# Patient Record
Sex: Female | Born: 1940 | Race: White | Hispanic: No | Marital: Married | Smoking: Never smoker
Health system: Southern US, Community
[De-identification: ages and names within clinical notes are randomized; demographics above are authoritative.]

## PROBLEM LIST (undated history)

## (undated) DIAGNOSIS — N390 Urinary tract infection, site not specified: Secondary | ICD-10-CM

## (undated) DIAGNOSIS — Z8719 Personal history of other diseases of the digestive system: Secondary | ICD-10-CM

## (undated) DIAGNOSIS — Z87898 Personal history of other specified conditions: Secondary | ICD-10-CM

## (undated) DIAGNOSIS — E039 Hypothyroidism, unspecified: Secondary | ICD-10-CM

## (undated) DIAGNOSIS — Z923 Personal history of irradiation: Secondary | ICD-10-CM

## (undated) DIAGNOSIS — M8580 Other specified disorders of bone density and structure, unspecified site: Secondary | ICD-10-CM

## (undated) DIAGNOSIS — Z9221 Personal history of antineoplastic chemotherapy: Secondary | ICD-10-CM

## (undated) DIAGNOSIS — C801 Malignant (primary) neoplasm, unspecified: Secondary | ICD-10-CM

## (undated) DIAGNOSIS — K219 Gastro-esophageal reflux disease without esophagitis: Secondary | ICD-10-CM

## (undated) DIAGNOSIS — M1990 Unspecified osteoarthritis, unspecified site: Secondary | ICD-10-CM

## (undated) DIAGNOSIS — Z91018 Allergy to other foods: Secondary | ICD-10-CM

## (undated) HISTORY — DX: Other specified disorders of bone density and structure, unspecified site: M85.80

## (undated) HISTORY — PX: BACK SURGERY: SHX140

## (undated) HISTORY — PX: ABDOMINAL HYSTERECTOMY: SHX81

## (undated) HISTORY — PX: HERNIA REPAIR: SHX51

## (undated) HISTORY — PX: CHOLECYSTECTOMY: SHX55

## (undated) HISTORY — PX: REDUCTION MAMMAPLASTY: SUR839

## (undated) HISTORY — PX: CATARACT EXTRACTION: SUR2

## (undated) HISTORY — PX: BREAST LUMPECTOMY: SHX2

## (undated) HISTORY — PX: APPENDECTOMY: SHX54

## (undated) HISTORY — PX: TUBAL LIGATION: SHX77

---

## 2012-01-05 HISTORY — PX: COLONOSCOPY: SHX174

## 2014-01-04 HISTORY — PX: CERVICAL DISC SURGERY: SHX588

## 2015-02-18 ENCOUNTER — Other Ambulatory Visit: Payer: Self-pay

## 2015-02-18 ENCOUNTER — Other Ambulatory Visit: Payer: Self-pay | Admitting: Family Medicine

## 2015-02-18 DIAGNOSIS — N6489 Other specified disorders of breast: Secondary | ICD-10-CM

## 2015-02-27 ENCOUNTER — Other Ambulatory Visit: Payer: Self-pay | Admitting: Family Medicine

## 2015-02-27 ENCOUNTER — Ambulatory Visit
Admission: RE | Admit: 2015-02-27 | Discharge: 2015-02-27 | Disposition: A | Discharge status: Home / Self Care | Payer: Medicare Other | Source: Ambulatory Visit | Attending: Family Medicine | Admitting: Family Medicine

## 2015-02-27 DIAGNOSIS — R921 Mammographic calcification found on diagnostic imaging of breast: Secondary | ICD-10-CM

## 2015-02-27 DIAGNOSIS — N6489 Other specified disorders of breast: Secondary | ICD-10-CM

## 2015-03-05 DIAGNOSIS — C801 Malignant (primary) neoplasm, unspecified: Secondary | ICD-10-CM

## 2015-03-05 HISTORY — DX: Malignant (primary) neoplasm, unspecified: C80.1

## 2015-03-11 ENCOUNTER — Ambulatory Visit: Payer: Self-pay | Admitting: Surgery

## 2015-03-11 DIAGNOSIS — C50912 Malignant neoplasm of unspecified site of left female breast: Secondary | ICD-10-CM

## 2015-03-11 NOTE — H&P (Signed)
Isabella Cook 03/11/2015 10:51 AM Location: [facility] Patient #: [id number] DOB: 07/08/1940 Married / Language: Isabella Cook / Race: White Female  History of Present Illness Isabella Cook A. Jewell Haught MD; 03/11/2015 12:37 PM) Patient words: Patient seen at the request of Dr. [name] secondary to abnormal screening mammogram. 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 The area measured 8 mm. No history of breast cancer in her family nor previous breast biopsy. Denies history of breast pain, nipple discharge or breast mass.          CLINICAL DATA: Status post stereotactic guided core needle biopsy of an 8 mm group of microcalcifications in the posterior aspect of the upper-outer quadrant of the left breast.  EXAM: DIAGNOSTIC LEFT MAMMOGRAM POST STEREOTACTIC BIOPSY  COMPARISON: Previous exam(s).  FINDINGS: Mammographic images were obtained following stereotactic guided biopsy of an 8 mm group of microcalcifications in the posterior aspect of the upper-outer quadrant of the left breast. These demonstrate a coil shaped biopsy marker clip 8 mm inferior to residual biopsied calcifications. Some of the targeted calcifications are absent following biopsy.  IMPRESSION: Coil shaped clip 8 mm inferior to the biopsied microcalcifications in the upper-outer quadrant of the left breast.  Final Assessment: Post Procedure Mammograms for Marker Placement   Electronically Signed By: [name] M.D. 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  The patient is a 75 year old female.   Other Problems [name], CMA; 03/11/2015 10:51 AM) Back Pain Bladder Problems Breast Cancer Cholelithiasis Diverticulosis Gastroesophageal Reflux Disease Hemorrhoids Hepatitis Oophorectomy Right. Thyroid Disease Transfusion history  Past Surgical History [name], CMA; 03/11/2015 10:51 AM) Appendectomy Breast Biopsy Left. Colon Polyp Removal - Colonoscopy Gallbladder Surgery - Laparoscopic Hysterectomy (not due to cancer) - Partial Spinal Surgery - Lower Back Spinal Surgery - Neck  Diagnostic Studies History [name], CMA; 03/11/2015 10:51 AM) Colonoscopy 1-5 years ago Mammogram 1-3 years ago  Allergies [name], CMA; 03/11/2015 10:52 AM) Gelatin *Assorted Classes** any medication that is gelatin based  Medication History [name], CMA; 03/11/2015 10:53 AM) Estring (2MG Ring, Vaginal) Active. Medications Reconciled  Social History [name], [place]; 03/11/2015 10:51 AM) Alcohol use Occasional alcohol use. Caffeine use Coffee, Tea. No drug use Tobacco use Former smoker.  Family History [name], [place]; 03/11/2015 10:51 AM) Arthritis Father. Cerebrovascular Accident Father. Colon Polyps Mother. Diabetes Mellitus Mother, Sister. Hypertension Father, Mother, Son. Kidney Disease Father. Respiratory Condition Mother.  Pregnancy / Birth History [name], CMA; 03/11/2015 10:51 AM) Age at menarche 57 years. Contraceptive History Oral contraceptives. Gravida 2 Maternal age 47-25 Para 2     Review of Systems [name] CMA; 03/11/2015 10:52 AM) General Not Present- Appetite Loss, Chills, Fatigue, Fever, Night Sweats, Weight Gain and Weight Loss. Skin Not Present- Change in Wart/Mole, Dryness, Hives, Jaundice, New Lesions, Non-Healing Wounds, Rash and Ulcer. HEENT Present- Hoarseness,  Ringing in the Ears, Seasonal Allergies and Wears glasses/contact lenses. Not Present- Earache, Hearing Loss, Nose Bleed, Oral Ulcers, Sinus Pain, Sore Throat, Visual Disturbances and Yellow Eyes. Respiratory Present- Chronic Cough. Not Present- Bloody  sputum, Difficulty Breathing, Snoring and Wheezing. Cardiovascular Not Present- Chest Pain, Difficulty Breathing Lying Down, Leg Cramps, Palpitations, Rapid Heart Rate, Shortness of Breath and Swelling of Extremities. Gastrointestinal Present- Constipation, Hemorrhoids and Indigestion. Not Present- Abdominal Pain, Bloating, Bloody Stool, Change in Bowel Habits, Chronic diarrhea, Difficulty Swallowing, Excessive gas, Gets full quickly at meals, Nausea, Rectal Pain and Vomiting. Female Genitourinary Present- Frequency. Not Present- Nocturia, Painful Urination, Pelvic Pain and Urgency. Musculoskeletal Present- Back Pain, Joint Pain, Joint Stiffness and Swelling of Extremities. Not Present- Muscle Pain and Muscle Weakness. Neurological Not Present- Decreased Memory, Fainting, Headaches, Numbness, Seizures, Tingling, Tremor, Trouble walking and Weakness. Psychiatric Not Present- Anxiety, Bipolar, Change in Sleep Pattern, Depression, Fearful and Frequent crying. Endocrine Not Present- Cold Intolerance, Excessive Hunger, Hair Changes, Heat Intolerance, Hot flashes and New Diabetes. Hematology Not Present- Easy Bruising, Excessive bleeding, Gland problems, HIV and Persistent Infections.  Vitals [name] CMA; 03/11/2015 10:53 AM) 03/11/2015 10:53 AM Weight: 175 lb Height: 64in Body Surface Area: 1.85 m Body Mass Index: 30.04 kg/m  Temp.: 97.65F(Temporal)  Pulse: 74 (Regular)  BP: 126/68 (Sitting, Left Arm, Standard)      Physical Exam (Dailey Alberson A. Brailee Riede MD; 03/11/2015 12:37 PM)  General Mental Status-Alert. General Appearance-Consistent with stated age. Hydration-Well hydrated. Voice-Normal.  Head and  Neck Head-normocephalic, atraumatic with no lesions or palpable masses. Trachea-midline. Thyroid Gland Characteristics - normal size and consistency.  Chest and Lung Exam Chest and lung exam reveals -quiet, even and easy respiratory effort with no use of accessory muscles and on auscultation, normal breath sounds, no adventitious sounds and normal vocal resonance. Inspection Chest Wall - Normal. Back - normal.  Breast Breast - Left-Symmetric, Non Tender, No Biopsy scars, no Dimpling, No Inflammation, No Lumpectomy scars, No Mastectomy scars, No Peau d' Orange. Breast - Right-Symmetric, Non Tender, No Biopsy scars, no Dimpling, No Inflammation, No Lumpectomy scars, No Mastectomy scars, No Peau d' Orange. Breast Lump-No Palpable Breast Mass.  Cardiovascular Cardiovascular examination reveals -normal heart sounds, regular rate and rhythm with no murmurs and normal pedal pulses bilaterally.  Neurologic Neurologic evaluation reveals -alert and oriented x 3 with no impairment of recent or remote memory. Mental Status-Normal.  Musculoskeletal Normal Exam - Left-Upper Extremity Strength Normal and Lower Extremity Strength Normal. Normal Exam - Right-Upper Extremity Strength Normal and Lower Extremity Strength Normal.  Lymphatic Head & Neck  General Head & Neck Lymphatics: Bilateral - Description - Normal. Axillary  General Axillary Region: Bilateral - Description - Normal. Tenderness - Non Tender.    Assessment & Plan (Annalyssa Thune A. 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 Discussed the pros and cons of each treatment modality. The patient desires breast conservation. We'll schedule left breast C localized partial mastectomy with left axillary sentinel lymph node mapping. Referral to medical and radiation oncology. Risk of  lumpectomy include bleeding, infection, seroma, more surgery, use of seed/wire, wound care, cosmetic deformity and the need for other treatments, death , blood clots, death. Pt agrees to proceed. Risk of sentinel lymph node mapping include bleeding, infection, lymphedema, shoulder pain. stiffness, dye allergy. cosmetic deformity , blood clots, death, need for more surgery. Pt agres to proceed.  Current Plans Pt Education - [facility] Breast Cancer Information Given - [facility] "Breast Journey" Package We discussed the staging and pathophysiology of breast cancer. We discussed all of the different options for treatment for breast cancer including surgery, chemotherapy, radiation therapy, Herceptin,  and antiestrogen therapy. We discussed a sentinel lymph node biopsy as she does not appear to having lymph node involvement right now. We discussed the performance of that with injection of radioactive tracer and blue dye. We discussed that she would have an incision underneath her axillary hairline. We discussed that there is a bout a 10-20% chance of having a positive node with a sentinel lymph node biopsy and we will await the permanent pathology to make any other first further decisions in terms of her treatment. One of these options might be to return to the operating room to perform an axillary lymph node dissection. We discussed about a 1-2% risk lifetime of chronic shoulder pain as well as lymphedema associated with a sentinel lymph node biopsy. We discussed the options for treatment of the breast cancer which included lumpectomy versus a mastectomy. We discussed the performance of the lumpectomy with a wire placement. We discussed a 10-20% chance of a positive margin requiring reexcision in the operating room. We also discussed that she may need radiation therapy or antiestrogen therapy or both if she undergoes lumpectomy. We discussed the mastectomy and the postoperative care for that as well. We discussed that there  is no difference in her survival whether she undergoes lumpectomy with radiation therapy or antiestrogen therapy versus a mastectomy. There is a slight difference in the local recurrence rate being 3-5% with lumpectomy and about 1% with a mastectomy. We discussed the risks of operation including bleeding, infection, possible reoperation. She understands her further therapy will be based on what her stages at the time of her operation.  Pt Education - [facility] breast cancer surgery: discussed with patient and provided information. Pt Education - [facility] Breast Biopsy [facility]CI: discussed with patient and provided information. Referred to Oncology, for evaluation and follow up (Oncology). Routine. Referred to Radiation Oncology, for evaluation and follow up (Radiation Oncology). Routine.

## 2015-03-12 ENCOUNTER — Encounter: Payer: Self-pay | Admitting: Oncology

## 2015-03-12 ENCOUNTER — Telehealth: Payer: Self-pay | Admitting: Oncology

## 2015-03-12 NOTE — Telephone Encounter (Signed)
New breast appt-staff message sent to [facility] and gave appt for 03/29 @ 4 w/Dr. [name].  Referring Dr. [name] Stage  Referral information scan under media tab

## 2015-03-13 ENCOUNTER — Other Ambulatory Visit: Payer: Self-pay | Admitting: Surgery

## 2015-03-13 DIAGNOSIS — N63 Unspecified lump in unspecified breast: Secondary | ICD-10-CM

## 2015-03-14 ENCOUNTER — Other Ambulatory Visit: Payer: Self-pay | Admitting: Surgery

## 2015-03-14 DIAGNOSIS — C50912 Malignant neoplasm of unspecified site of left female breast: Secondary | ICD-10-CM

## 2015-03-25 ENCOUNTER — Ambulatory Visit
Admission: RE | Admit: 2015-03-25 | Discharge: 2015-03-25 | Disposition: A | Discharge status: Home / Self Care | Payer: Medicare Other | Source: Ambulatory Visit | Attending: Surgery | Admitting: Surgery

## 2015-03-25 DIAGNOSIS — N63 Unspecified lump in unspecified breast: Secondary | ICD-10-CM

## 2015-03-25 MED ORDER — GADOBENATE DIMEGLUMINE 529 MG/ML IV SOLN
16.0000 mL | Freq: Once | INTRAVENOUS | Status: AC | PRN
  Administered 2015-03-25: 16 mL via INTRAVENOUS

## 2015-03-26 ENCOUNTER — Other Ambulatory Visit: Payer: Self-pay | Admitting: Surgery

## 2015-03-26 DIAGNOSIS — R928 Other abnormal and inconclusive findings on diagnostic imaging of breast: Secondary | ICD-10-CM

## 2015-03-26 DIAGNOSIS — R2232 Localized swelling, mass and lump, left upper limb: Secondary | ICD-10-CM

## 2015-03-30 NOTE — Progress Notes (Signed)
Location of Breast Cancer:Left Upper-Outer Left Female Breast  Histology per Pathology Report: Diagnosis  02-27-15 Breast, left, needle core biopsy, UOQ INVASIVE DUCTAL CARCINOMA WITH ASSOCIATED CALCIFICATIONS, GRADE 2 DUTAL CARCINOMA IN SITU IS PRESENT Microscopic Comment Receptor Status: ER*100%, PR (100%), Her2-neu (-), Ki-(20%) Did patient present with symptoms (if so, please note symptoms) or was this found on screening mammography?: 02-27-15 Isabella Cook presented with an abnormal mammogram.  Past/Anticipated interventions by surgeon, if any: 4-5-17Left Mastectomy planned Dr. [name]  Past/Anticipated interventions by medical oncology, if any: Appointment with Dr. [name] 04-02-15 at 4 p.m.  Lymphedema issues, if any:No Pain issues, if any:     SAFETY ISSUES:  Prior radiation:No  Pacemaker/ICD? :No  Possible current pregnancy?:No  Is the patient on methotrexate? No  Current Complaints / other details:  Menarche age 59, G91,P2, BC 1 year, HRT 10-15 years,  Menopaus 8 .BP 144/73 mmHg  Pulse 82  Temp(Src) 94.4 F (34.7 C) (Oral)  Resp 18  Ht 5' 3.5" (1.613 m)  Wt 176 lb 12.8 oz (80.196 kg)  BMI 30.82 kg/m2  SpO2 96% .vs  [name], RN 03/30/2015,5:30 PM

## 2015-03-31 ENCOUNTER — Other Ambulatory Visit: Payer: Medicare Other

## 2015-04-01 ENCOUNTER — Other Ambulatory Visit: Payer: Self-pay

## 2015-04-01 DIAGNOSIS — C50919 Malignant neoplasm of unspecified site of unspecified female breast: Secondary | ICD-10-CM

## 2015-04-02 ENCOUNTER — Other Ambulatory Visit (HOSPITAL_BASED_OUTPATIENT_CLINIC_OR_DEPARTMENT_OTHER): Payer: Medicare Other

## 2015-04-02 ENCOUNTER — Ambulatory Visit
Admission: RE | Admit: 2015-04-02 | Discharge: 2015-04-02 | Disposition: A | Discharge status: Home / Self Care | Payer: Medicare Other | Source: Ambulatory Visit | Attending: Radiation Oncology | Admitting: Radiation Oncology

## 2015-04-02 ENCOUNTER — Ambulatory Visit
Admission: RE | Admit: 2015-04-02 | Discharge: 2015-04-02 | Disposition: A | Discharge status: Home / Self Care | Payer: Medicare Other | Source: Ambulatory Visit | Attending: Surgery | Admitting: Surgery

## 2015-04-02 ENCOUNTER — Encounter: Payer: Self-pay | Admitting: Nurse Practitioner

## 2015-04-02 ENCOUNTER — Encounter: Payer: Self-pay | Admitting: Radiation Oncology

## 2015-04-02 ENCOUNTER — Encounter (HOSPITAL_BASED_OUTPATIENT_CLINIC_OR_DEPARTMENT_OTHER): Payer: Self-pay | Admitting: *Deleted

## 2015-04-02 ENCOUNTER — Ambulatory Visit (HOSPITAL_BASED_OUTPATIENT_CLINIC_OR_DEPARTMENT_OTHER): Payer: Medicare Other | Admitting: Oncology

## 2015-04-02 ENCOUNTER — Encounter: Payer: Self-pay | Admitting: *Deleted

## 2015-04-02 VITALS — BP 144/73 | HR 82 | Temp 94.4°F | Resp 18 | Ht 63.5 in | Wt 176.8 lb

## 2015-04-02 VITALS — BP 161/69 | HR 75 | Temp 97.9°F | Resp 19 | Wt 175.8 lb

## 2015-04-02 DIAGNOSIS — C50412 Malignant neoplasm of upper-outer quadrant of left female breast: Secondary | ICD-10-CM | POA: Diagnosis present

## 2015-04-02 DIAGNOSIS — Z170 Estrogen receptor positive status [ER+]: Secondary | ICD-10-CM | POA: Insufficient documentation

## 2015-04-02 DIAGNOSIS — R928 Other abnormal and inconclusive findings on diagnostic imaging of breast: Secondary | ICD-10-CM

## 2015-04-02 DIAGNOSIS — M8580 Other specified disorders of bone density and structure, unspecified site: Secondary | ICD-10-CM

## 2015-04-02 DIAGNOSIS — C50912 Malignant neoplasm of unspecified site of left female breast: Secondary | ICD-10-CM | POA: Insufficient documentation

## 2015-04-02 DIAGNOSIS — C50919 Malignant neoplasm of unspecified site of unspecified female breast: Secondary | ICD-10-CM

## 2015-04-02 LAB — CBC WITH DIFFERENTIAL/PLATELET
BASO%: 0.1 % (ref 0.0–2.0)
BASOS ABS: 0 10*3/uL (ref 0.0–0.1)
EOS ABS: 0.1 10*3/uL (ref 0.0–0.5)
EOS%: 1.4 % (ref 0.0–7.0)
HCT: 41.6 % (ref 34.8–46.6)
HEMOGLOBIN: 13.6 g/dL (ref 11.6–15.9)
LYMPH%: 22.1 % (ref 14.0–49.7)
MCH: 28.9 pg (ref 25.1–34.0)
MCHC: 32.7 g/dL (ref 31.5–36.0)
MCV: 88.5 fL (ref 79.5–101.0)
MONO#: 0.8 10*3/uL (ref 0.1–0.9)
MONO%: 12.9 % (ref 0.0–14.0)
NEUT%: 63.5 % (ref 38.4–76.8)
NEUTROS ABS: 3.8 10*3/uL (ref 1.5–6.5)
PLATELETS: 207 10*3/uL (ref 145–400)
RBC: 4.7 10*6/uL (ref 3.70–5.45)
RDW: 14 % (ref 11.2–14.5)
WBC: 5.9 10*3/uL (ref 3.9–10.3)
lymph#: 1.3 10*3/uL (ref 0.9–3.3)

## 2015-04-02 LAB — COMPREHENSIVE METABOLIC PANEL
ALBUMIN: 4 g/dL (ref 3.5–5.0)
ALK PHOS: 55 U/L (ref 40–150)
ALT: 15 U/L (ref 0–55)
ANION GAP: 8 meq/L (ref 3–11)
AST: 15 U/L (ref 5–34)
BUN: 11.1 mg/dL (ref 7.0–26.0)
CO2: 30 mEq/L — ABNORMAL HIGH (ref 22–29)
Calcium: 10.1 mg/dL (ref 8.4–10.4)
Chloride: 106 mEq/L (ref 98–109)
Creatinine: 0.9 mg/dL (ref 0.6–1.1)
EGFR: 61 mL/min/{1.73_m2} — AB (ref >90)
Glucose: 94 mg/dl (ref 70–140)
POTASSIUM: 4.2 meq/L (ref 3.5–5.1)
Sodium: 143 mEq/L (ref 136–145)
TOTAL PROTEIN: 7.8 g/dL (ref 6.4–8.3)

## 2015-04-02 MED ORDER — GADOBENATE DIMEGLUMINE 529 MG/ML IV SOLN
16.0000 mL | Freq: Once | INTRAVENOUS | Status: AC | PRN
  Administered 2015-04-02: 16 mL via INTRAVENOUS

## 2015-04-02 NOTE — Progress Notes (Signed)
Radiation Oncology         [phone]) [phone] ________________________________  Initial outpatient Consultation - Date: 04/02/2015   Name: Isabella Cook MRN: [id number]   DOB: July 11, 1940  REFERRING PHYSICIAN: [name], MD  DIAGNOSIS AND STAGE: T1 N0 Left breast cancer No matching staging information was found for the patient.  HISTORY OF PRESENT ILLNESS::Isabella Cook is a 75 y.o. female  who had a screening mammogram near [facility] which showed an area of calcification in the UOQ of the left breast. These measured about 8 mm. Stereotactic biopsy on 2/23 showed Grade 2 invasive ductal carcinoma which was ER+ and PR+ at 100%. HER-2 was also amplified. She had a breast MRI on 3/21 which showed an area of non-mass enhancement measuring 7.8 cm with two irregular masses measuring 1.7 and 1.4 cm. These masses were 5 cm anterior and superior to the biopsy clip. A left axillary lymph node had cortical thickening. A 7 mm mass was noted in the right breast, as well.   Biopsy of the NME of the left breast, as well as the right breast mass was performed this morning. She is scheduled to see Dr. [name] today as well. He has discussed anti HER-2 therapy and is recommending port placement. Her definitive surgery is scheduled for April 5th but may change based on the results of these biopsies.  Isabella Cook is accompanied by her husband today. She did well with her biopsy this morning and is without complaint. The patient is leaning towards breast conservation surgery at this time. The patient and her husband live in [place].   PREVIOUS RADIATION THERAPY: No  Past medical, social and family history were reviewed in the electronic chart. Review of symptoms was reviewed in the electronic chart. Medications were reviewed in the electronic chart.   Menarche age 67, G51,P2, BC 1 year, HRT 10-15 years,  Menopause 60  PHYSICAL EXAM:  Filed Vitals:   04/02/15 1440  BP: 144/73  Pulse: 82  Temp: 94.4 F (34.7 C)   Resp: 18  .176 lb 12.8 oz (80.196 kg).   Neck: No palpable cervical or supraclavicular adenopathy present. Well healed surgical incision above the left clavicle. Breast: Left and Right breast both have a blood soaked bandage on them with no palpable abnormalities.    IMPRESSION: T1 N0 Left breast cancer  PLAN: I spoke to the patient today regarding her diagnosis and options for treatment. We discussed the equivalence in terms of survival and local failure between mastectomy and breast conservation. We discussed the role of radiation in decreasing local failures in patients who undergo mastectomy and have positive lymph nodes or tumors greater than 5 cm. We discussed the role of radiation and decreasing local failures in patients who undergo lumpectomy.We discussed the possible side effects including but not limited to asymptomatic rib, heart and lung damage, heart disease, skin redness, fatigue, permanent skin darkening, and chest wall swelling. We discussed increased complications that can occur with reconstruction after radiation. We discussed the process of simulation and the placement tattoos. We discussed the low likelihood of secondary malignancies.   Biopsy from 3/29 pending. Based on these results, Dr. [name] and Dr. [name] Stage will determine future treatment options and refer back to our clinic as appropriate. He was able to meet with survivorship and is headed up to meet with medical oncology.   I spent 40 minutes  face to face with the patient and more than 50% of that time was spent in counseling and/or coordination of  care.   ------------------------------------------------  Thea Silversmith, MD  This document serves as a record of services personally performed by Thea Silversmith, MD. It was created on her behalf by Arlyce Harman, a trained medical scribe. The creation of this record is based on the scribe's personal observations and the provider's statements to them. This document  has been checked and approved by the attending provider.

## 2015-04-02 NOTE — Addendum Note (Signed)
Encounter addended by: Malena Edman, RN on: 04/02/2015  5:08 PM<BR>     Documentation filed: Charges VN

## 2015-04-02 NOTE — Progress Notes (Signed)
[place]  [phone] [phone]     ID: Isabella Cook DOB: Mar 18, 1940  MR#: [id number]  LXB#:[id number]  Patient Care Team: [name], FNP as PCP - General (Family Medicine) [name], MD as Consulting Physician (Oncology) [name], MD as Consulting Physician (General Surgery) [name], NP as Nurse Practitioner (Hematology and Oncology) [name], MD as Consulting Physician (Oncology) Thea Silversmith, MD as Consulting Physician (Radiation Oncology) [name], MD as Consulting Physician (Neurosurgery) [name], MD as Referring Physician (Otolaryngology) PCP: [name], FNP OTHER MD:  CHIEF COMPLAINT: HER-2 positive breast cancer  CURRENT TREATMENT: Workup in progress   BREAST CANCER HISTORY:    Isabella Cook had routine screening mammography at [facility] suggesting an area of indeterminate microcalcifications in the upper outer quadrant of the left breast measuring 8 mm. She was referred for left breast stereotactic biopsy performed at the [facility] 02/27/2015. This showed ([id number]) invasive ductal carcinoma, grade 2, estrogen receptor 100% positive, digestion receptor 100% positive, both with strong staining intensity, with an MIB-1 of 20%, and HER-2 amplified with a signals ratio of 2.53, the number per cell being 2.65. 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  On 03/25/2015 the patient underwent bilateral breast MRI, which showed the breast density to be category B. this showed, in the left breast, an area of non-masslike enhancement measuring 7.8 cm. Associated with this were 2 adjacent irregular masses measuring 1.7 and 1.4 cm. The masses were 5 cm anterior and superior to the biopsy clip. There was a cortically thickened left axillary lymph node noted.  Also in the right breast there was a 7 mm irregular enhancing mass at the 6:30 o'clock position.  Her subsequent  history is as detailed below    INTERVAL HISTORY: Isabella Cook was evaluated in the breast clinic 04/02/2015 accompanied by her husband Isabella Cook. She also met with Dr. Pablo Ledger in radiation oncology the same day  REVIEW OF SYSTEMS: There were no specific symptoms leading to the original mammogram, which was routinely scheduled. The patient denies unusual headaches, visual changes, nausea, vomiting, stiff neck, dizziness, or gait imbalance. There has been no significant cough, phlegm production, or pleurisy, no chest pain or pressure, and no change in bowel or bladder habits. The patient denies fever, rash, bleeding, unexplained fatigue or unexplained weight loss. She does have multiple food allergies and is receiving desensitization shots through Dr. Lovena Le in [place]. She has seasonal allergies with some ringing in her ears, runny nose, and sinus drainage. She just had a tooth extracted and is on antibiotics for what may be a small. Dental abscess. A detailed review of systems was otherwise entirely negative.  PAST MEDICAL HISTORY: Past Medical History  Diagnosis Date  . GERD (gastroesophageal reflux disease)   . Arthritis     wrists and knees  . Cancer [facility]) 03-2015    left breast  . Multiple food allergies     PAST SURGICAL HISTORY: Past Surgical History  Procedure Laterality Date  . Abdominal hysterectomy    . Cholecystectomy    . Appendectomy    . Tubal ligation    . Back surgery      lumbar  . Hernia repair      UHR  . Cervical disc surgery  2016    FAMILY HISTORY No family history on file. The patient's father had a history of pseudo-bulbar pulse 1 and died from a stroke at age 54. The patient's mother died at age  79 from complications of emphysema. The patient had no brothers, 2 sisters. There is no history of cancer in the family and specifically no history of breast or ovarian cancer.  GYNECOLOGIC HISTORY:  No LMP recorded. Patient has had a hysterectomy. Menarche age 65, first  live birth age 17. She is GX P2. She underwent a hysterectomy with left salpingo-oophorectomy in 1984. She received hormone replacement for approximately 15 years, until 2005.  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 Her husband Isabella Cook is a retired Social research officer, government. Daughter Isabella Cook lives in [place] where she is a housewife, homeschooling her children. Son Isabella Cook lives in [place] and works in Chartered certified accountant. The patient has 2 grandchildren. She is not a church attender    ADVANCED DIRECTIVES: In place   HEALTH MAINTENANCE: Social History  Substance Use Topics  . Smoking status: Never Smoker   . Smokeless tobacco: Not on file  . Alcohol Use: Yes     Comment: occ     Colonoscopy:2014  PAP:  Bone density: 2017 [facility]//osteopenia   Lipid panel:  Allergies  Allergen Reactions  . Gelatin (Bovine) [Beef Extract] Anaphylaxis  . Milk-Related Compounds     Milk products    Current Outpatient Prescriptions  Medication Sig Dispense Refill  . calcium carbonate (TUMS - DOSED IN MG ELEMENTAL CALCIUM) 500 MG chewable tablet Chew 1 tablet by mouth daily.    Marland Kitchen estradiol (ESTRING) 2 MG vaginal ring Place 2 mg vaginally every 3 (three) months. follow package directions    . famotidine (PEPCID) 20 MG tablet Take 20 mg by mouth 2 (two) times daily.    Marland Kitchen loratadine (CLARITIN) 10 MG tablet Take 10 mg by mouth daily.    . Menaquinone-7 (VITAMIN K2 PO) Take 45 mcg by mouth daily.    . penicillin v potassium (VEETID) 500 MG tablet Take 500 mg by mouth 4 (four) times daily.    . Probiotic Product (ALIGN PO) Take by mouth.    Marland Kitchen VITAMIN D, CHOLECALCIFEROL, PO Take 5,000 Units by mouth daily.    Marland Kitchen VITAMIN D-VITAMIN K PO Take by mouth.    . methylcellulose oral powder Take by mouth daily. Reported on 04/02/2015     No current facility-administered medications for this visit.    OBJECTIVE:  Middle-aged white woman who appears stated age  29 Vitals:   04/02/15 1605  BP: 161/69  Pulse: 75  Temp: 97.9 F (36.6 C)  Resp: 19     Body mass index is 30.65 kg/(m^2).    ECOG FS:1 - Symptomatic but completely ambulatory  Ocular: Sclerae unicteric, pupils equal, round and reactive to light Ear-nose-throat: Oropharynx clear and moist Lymphatic: No cervical or supraclavicular adenopathy Lungs no rales or rhonchi, good excursion bilaterally Heart regular rate and rhythm, no murmur appreciated Abd soft, nontender, positive bowel sounds MSK no focal spinal tenderness, no joint edema Neuro: non-focal, well-oriented, appropriate affect Breasts: Both breasts are status post recent biopsy. Steri-Strips are still in place. There is a moderate ecchymosis on the left. There is a palpable mass in the inferior outer aspect of the left breast which may be related to the recent biopsy. Both axillae are benign.   LAB RESULTS:  CMP     Component Value Date/Time   NA 143 04/02/2015 1554   K 4.2 04/02/2015 1554   CO2 30* 04/02/2015 1554   GLUCOSE 94 04/02/2015 1554   BUN  11.1 04/02/2015 1554   CREATININE 0.9 04/02/2015 1554   CALCIUM 10.1 04/02/2015 1554   PROT 7.8 04/02/2015 1554   ALBUMIN 4.0 04/02/2015 1554   AST 15 04/02/2015 1554   ALT 15 04/02/2015 1554   ALKPHOS 55 04/02/2015 1554   BILITOT <0.30 04/02/2015 1554    INo results found for: SPEP, UPEP  Lab Results  Component Value Date   WBC 5.9 04/02/2015   NEUTROABS 3.8 04/02/2015   HGB 13.6 04/02/2015   HCT 41.6 04/02/2015   MCV 88.5 04/02/2015   PLT 207 04/02/2015      Chemistry      Component Value Date/Time   NA 143 04/02/2015 1554   K 4.2 04/02/2015 1554   CO2 30* 04/02/2015 1554   BUN 11.1 04/02/2015 1554   CREATININE 0.9 04/02/2015 1554      Component Value Date/Time   CALCIUM 10.1 04/02/2015 1554   ALKPHOS 55 04/02/2015 1554   AST 15 04/02/2015 1554   ALT 15 04/02/2015 1554   BILITOT <0.30 04/02/2015  1554       No results found for: LABCA2  No components found for: LABCA125  No results for input(s): INR in the last 168 hours.  Urinalysis No results found for: COLORURINE, APPEARANCEUR, LABSPEC, PHURINE, GLUCOSEU, HGBUR, BILIRUBINUR, KETONESUR, PROTEINUR, UROBILINOGEN, NITRITE, LEUKOCYTESUR    ELIGIBLE FOR AVAILABLE RESEARCH PROTOCOL: no  STUDIES: Mr Breast Bilateral W Wo Contrast  03/25/2015  CLINICAL DATA:  Patient with recent diagnosis of left breast grade 2 invasive ductal carcinoma with associated ductal carcinoma in situ. EXAM: BILATERAL BREAST MRI WITH AND WITHOUT CONTRAST TECHNIQUE: Multiplanar, multisequence MR images of both breasts were obtained prior to and following the intravenous administration of 16 ml of MultiHance. THREE-DIMENSIONAL MR IMAGE RENDERING ON INDEPENDENT WORKSTATION: Three-dimensional MR images were rendered by post-processing of the original MR data on an independent workstation. The three-dimensional MR images were interpreted, and findings are reported in the following complete MRI report for this study. Three dimensional images were evaluated at the independent DynaCad workstation COMPARISON:  Previous exam(s). FINDINGS: Breast composition: b.  Scattered fibroglandular tissue. Background parenchymal enhancement: Mild Right breast: Within the 630 o'clock right breast middle depth there is a 7 mm irregular enhancing mass (image 118; series 7). No additional concerning areas of enhancement are identified within the right breast. Left breast: Susceptibility artifact is demonstrated within the lower outer left breast middle to posterior depth compatible with biopsy marking clip. Extending anterior and superior from this biopsy marking clip is a 7.8 x 3.6 x 3.3 cm area of clumped non mass enhancement. Along the anterior superior margin of this clumped non mass enhancement there are 2 adjacent irregular enhancing masses measuring 1.7 x 1.0 x 1.1 cm and 1.4 x 1.1 x 1.2  cm. These masses are located within the upper-outer left breast and are approximately 8 mm apart. These masses are located approximately 5 cm anterior and superior to the biopsy marking clip on sagittal imaging. Lymph nodes: There is a cortically thickened left axillary lymph node (image 64; series 4). Ancillary findings:  None. IMPRESSION: Large area of clumped non mass enhancement throughout the lower outer and upper outer left breast concerning for extension of disease throughout the lateral left breast given history of recently biopsied left breast invasive ductal carcinoma. Two adjacent irregular enhancing masses within the upper-outer left breast along the cranial margin of the non mass enhancement, concerning for associated disease, likely invasive given the imaging characteristics. Irregular suspicious enhancing mass within the  630 o'clock right breast. Cortically thickened left axillary lymph node. RECOMMENDATION: MRI guided core needle biopsy irregular enhancing mass within the lower outer right breast. If left breast conservation is desired, at the time of the above recommended right breast MRI guided core needle biopsy, MRI guided core needle biopsy of the furthest extent of disease within the upper-outer left breast can be obtained. Second-look ultrasound and possible biopsy of cortically thickened left axillary lymph node. BI-RADS CATEGORY  4: Suspicious. Electronically Signed   By: Lovey Newcomer M.D.   On: 03/25/2015 16:19   Mm Digital Diagnostic Bilat  04/02/2015  CLINICAL DATA:  Status post MRI guided core biopsies bilaterally. EXAM: DIAGNOSTIC BILATERAL MAMMOGRAM POST MRI BIOPSY COMPARISON:  Previous exam(s). FINDINGS: Mammographic images were obtained following MRI guided biopsy of area of enhancement within the 630 o'clock location of the right breast. A cylinder-shaped clip is identified in the lower outer quadrant of the right breast, in expected location. Following biopsy of an enhancing mass  the upper-outer quadrant, anterior left breast, a bow tie shaped clip is identified in the anterior upper outer quadrant as expected. This clip is approximately 6.3 cm anterior to the clip placed the time of recent diagnosis malignancy. IMPRESSION: Tissue marker clips are in expected locations bilaterally. Final Assessment: Post Procedure Mammograms for Marker Placement Electronically Signed   By: Nolon Nations M.D.   On: 04/02/2015 10:01   Mr Aundra Millet Breast Bx Johnella Moloney Dev 1st Lesion Image Bx Spec Mr Guide  04/02/2015  CLINICAL DATA:  Patient presents for MR guided core biopsies of lesions bilaterally. Recently diagnosed grade 2 invasive ductal carcinoma with associated calcifications diagnosed by stereotactic guided core biopsy of calcifications in the posterior upper outer quadrant of the left breast. EXAM: MR GUIDED CORE NEEDLE BIOPSY OF THE LEFT BREAST MRI GUIDED CORE NEEDLE BIOPSY OF THE RIGHT BREAST TECHNIQUE: Multiplanar, multisequence MR imaging of bilateral breasts was performed both before and after administration of intravenous contrast. CONTRAST:  49m MULTIHANCE GADOBENATE DIMEGLUMINE 529 MG/ML IV SOLN COMPARISON:  Previous exams. FINDINGS: I met with the patient, and we discussed the procedure of MRI guided biopsy, including risks, benefits, and alternatives. Specifically, we discussed the risks of infection, bleeding, tissue injury, clip migration, and inadequate sampling. Informed, written consent was given. The usual time out protocol was performed immediately prior to the procedure. SITE 1, LEFT BREAST, BOW TIE SHAPED CLIP: Using sterile technique, 1% Lidocaine, MRI guidance, and a 9 gauge vacuum assisted device, biopsy was performed of enhancing mass within the anterior upper outer quadrant of the left breast using a lateral approach. At the conclusion of the procedure, a bow tie shaped tissue marker clip was deployed into the biopsy cavity. SITE 2, RIGHT BREAST, CYLINDER-SHAPED CLIP: Using the  same technique, 1% lidocaine, MR guidance, and a 9 gauge vacuum assisted device, biopsy was performed of a small enhancing mass within the lower outer quadrant of the right breast using a lateral approach. At the conclusion of the procedure a cylinder-shaped clip was deployed at the biopsy site. Follow-up 2-view mammogram was performed and dictated separately. IMPRESSION: MRI guided biopsy of single lesion in each breast. No apparent complications. Electronically Signed   By: ENolon NationsM.D.   On: 04/02/2015 10:15   Mr Rt Breast Bx WJohnella MoloneyDev 1st Lesion Image Bx Spec Mr Guide  04/02/2015  CLINICAL DATA:  Patient presents for MR guided core biopsies of lesions bilaterally. Recently diagnosed grade 2 invasive ductal carcinoma with associated calcifications diagnosed  by stereotactic guided core biopsy of calcifications in the posterior upper outer quadrant of the left breast. EXAM: MR GUIDED CORE NEEDLE BIOPSY OF THE LEFT BREAST MRI GUIDED CORE NEEDLE BIOPSY OF THE RIGHT BREAST TECHNIQUE: Multiplanar, multisequence MR imaging of bilateral breasts was performed both before and after administration of intravenous contrast. CONTRAST:  6m MULTIHANCE GADOBENATE DIMEGLUMINE 529 MG/ML IV SOLN COMPARISON:  Previous exams. FINDINGS: I met with the patient, and we discussed the procedure of MRI guided biopsy, including risks, benefits, and alternatives. Specifically, we discussed the risks of infection, bleeding, tissue injury, clip migration, and inadequate sampling. Informed, written consent was given. The usual time out protocol was performed immediately prior to the procedure. SITE 1, LEFT BREAST, BOW TIE SHAPED CLIP: Using sterile technique, 1% Lidocaine, MRI guidance, and a 9 gauge vacuum assisted device, biopsy was performed of enhancing mass within the anterior upper outer quadrant of the left breast using a lateral approach. At the conclusion of the procedure, a bow tie shaped tissue marker clip was deployed  into the biopsy cavity. SITE 2, RIGHT BREAST, CYLINDER-SHAPED CLIP: Using the same technique, 1% lidocaine, MR guidance, and a 9 gauge vacuum assisted device, biopsy was performed of a small enhancing mass within the lower outer quadrant of the right breast using a lateral approach. At the conclusion of the procedure a cylinder-shaped clip was deployed at the biopsy site. Follow-up 2-view mammogram was performed and dictated separately. IMPRESSION: MRI guided biopsy of single lesion in each breast. No apparent complications. Electronically Signed   By: ENolon NationsM.D.   On: 04/02/2015 10:15    ASSESSMENT: 75y[facility]  status post left breast biopsy 02/27/2015 for a clinical mT1-3 N1 invasive ductal carcinoma, grade 2, strongly estrogen and progesterone receptor positive, HER-2 amplified, with an MIB-1 of 20%.   (1) bilateral breast MRI 03/25/2015 shows, in addition to a large area of non-masslike enhancement in the left breast, a 0.7 cm mass in the lower outer quadrant of the contralateral, right breast; biopsy of both these areas was performed 04/02/2015-- results pending  (2)  Surgery tentatively  scheduled for 04/09/2015  (3) adjuvant systemic treatment will consist of anti-HER-2 therapy with chemotherapy  (4) adjuvant radiation therapy to follow as appropriate  (5) adjuvant anti-follow estrogens to follow at the completion of local treatment.  PLAN: We spent the better part of today's hour-long appointment discussing the biology of breast cancer in general, and the specifics of the patient's tumor in particular. JJonieceunderstands I am not able to give her a firm stage at present. He has a very large area of abnormality in the left breast, but how much of this is DCIS and how much is invasive disease is very difficult to tell.  I do not see how she can avoid a left mastectomy even if we do neoadjuvant chemotherapy. If we do have a complete radiologic response possibly a limited  resection could be attempted.  On the other hand the extent of disease does affect the choice of chemotherapy. If we have a tumor less than 2 cm I would probably use only docetaxel (in conjunction with trastuzumab) whereas if it is greater than 2.0 cm I would use carboplatin and docetaxel in conjunction with trastuzumab and pertuzumab.  We did discuss the fact that the choice of lumpectomy versus mastectomy does not affect overall survival and she understands that whether we give the chemotherapy before or after surgery also does not affect survival.  It will  help to have the results of the biopsies obtained today and she also has a lymph node biopsy scheduled for 04/04/2015. Most likely the best plan will be for her to discuss all this data once it becomes available with Dr. [name] Stage and then she and he can make a definitive decision.  In any case from as systemic therapy point of view she will need a port. I will also schedule her to come to "chemotherapy school" prior to the start of any treatment. She will need an echocardiogram before anti-HER-2 therapy is started.  I emphasized to Diyana the triple positive patients generally have an excellent chance of cure. If her risk of recurrence after local treatment only is X, by doing chemotherapy, anti-estrogens, and anti-HER-2 treatment her final risk of recurrence will be 1/6 of X. (Of course I cannot give her a number for ask until I have more data regarding her primary tumor).  She has an Estring in place and this will need to be removed. Eventually it could be reinserted if she starts tamoxifen.  Tentatively I am scheduling Isabella Cook to return to see me on April 14 but we can move that day forward to her back depending on which decision she finally makes with Dr. [name] Stage.  Skyleen has a good understanding of the overall plan. She agrees with it. She knows the goal of treatment in her case is cure. She will call with any problems that may develop before her  next visit here.  [name], MD   04/02/2015 6:51 PM Medical Oncology and Hematology [facility] [address] [place], [place] [address] Tel. [phone]    Fax. [phone]

## 2015-04-02 NOTE — Progress Notes (Signed)
Ms. Isabella Cook is a very pleasant 75 y.o. 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 Biopsy results revealed the tumor's prognostic profile is ER positive, PR positive, and HER2/neu positive.Marland Kitchen Ki67 is 20%.  She presents today with her husband for treatment consideration and recommendations from the radiation oncologist and medical oncologist.     I briefly met with Ms. Isabella Cook and her husband during her visit today. We discussed the purpose of the Survivorship Clinic, which will include monitoring for recurrence, coordinating completion of age and gender-appropriate cancer screenings, promotion of overall wellness, as well as managing potential late/long-term side effects of anti-cancer treatments.    The treatment plan for Ms. Isabella Cook will likely include surgery, chemotherapy, radiation therapy, and anti-estrogen therapy.  As of today, the intent of treatment for Ms. Isabella Cook is cure, therefore she will be eligible for the Survivorship Clinic upon her completion of treatment.  Her survivorship care plan (SCP) document will be drafted and updated throughout the course of her treatment trajectory. She will receive the SCP in an office visit with myself in the Survivorship Clinic once she has completed treatment.   Ms. Isabella Cook was encouraged to ask questions and all questions were answered to her satisfaction.  She was given my business card and encouraged to contact me with any concerns regarding survivorship.  I look forward to participating in her care.   Kenn File, [place] [phone]

## 2015-04-03 ENCOUNTER — Other Ambulatory Visit: Payer: Medicare Other

## 2015-04-03 ENCOUNTER — Other Ambulatory Visit: Payer: Self-pay | Admitting: *Deleted

## 2015-04-03 ENCOUNTER — Telehealth: Payer: Self-pay | Admitting: Oncology

## 2015-04-03 ENCOUNTER — Other Ambulatory Visit: Payer: Self-pay | Admitting: Surgery

## 2015-04-03 DIAGNOSIS — R2232 Localized swelling, mass and lump, left upper limb: Secondary | ICD-10-CM

## 2015-04-03 NOTE — Telephone Encounter (Signed)
Spoke with patient to sch chemo edu. Pt would like to call back and sxh once she has spoken with her surgeon and know what his plans are. Gave pt contact number for scheduling

## 2015-04-03 NOTE — Telephone Encounter (Signed)
Patient called back and chem edu is sch for 4/4 at noon

## 2015-04-04 ENCOUNTER — Ambulatory Visit
Admission: RE | Admit: 2015-04-04 | Discharge: 2015-04-04 | Disposition: A | Discharge status: Home / Self Care | Payer: Medicare Other | Source: Ambulatory Visit | Attending: Surgery | Admitting: Surgery

## 2015-04-04 DIAGNOSIS — R2232 Localized swelling, mass and lump, left upper limb: Secondary | ICD-10-CM

## 2015-04-07 ENCOUNTER — Telehealth: Payer: Self-pay | Admitting: *Deleted

## 2015-04-08 ENCOUNTER — Other Ambulatory Visit: Payer: Medicare Other

## 2015-04-09 ENCOUNTER — Ambulatory Visit (HOSPITAL_COMMUNITY): Payer: Medicare Other

## 2015-04-09 ENCOUNTER — Ambulatory Visit (HOSPITAL_BASED_OUTPATIENT_CLINIC_OR_DEPARTMENT_OTHER): Admission: RE | Admit: 2015-04-09 | Payer: Medicare Other | Source: Ambulatory Visit | Admitting: Surgery

## 2015-04-09 HISTORY — DX: Malignant (primary) neoplasm, unspecified: C80.1

## 2015-04-09 HISTORY — DX: Unspecified osteoarthritis, unspecified site: M19.90

## 2015-04-09 HISTORY — DX: Allergy to other foods: Z91.018

## 2015-04-09 HISTORY — DX: Gastro-esophageal reflux disease without esophagitis: K21.9

## 2015-04-09 SURGERY — RADIOACTIVE SEED GUIDED PARTIAL MASTECTOMY WITH AXILLARY SENTINEL LYMPH NODE BIOPSY
Anesthesia: General | Site: Breast | Laterality: Left

## 2015-04-10 ENCOUNTER — Ambulatory Visit: Payer: Self-pay | Admitting: Surgery

## 2015-04-10 NOTE — H&P (Signed)
04/10/2015 1:14 PM Location: [place] Patient #: [id number] DOB: 1940/06/05 Married / Language: English / Race: White Female  History of Present Illness (Isabella Cook A. 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 She has seen oncology and needs chemotherapy. she returns today to discusse options.  She has no complaints.  The patient is a 75 year old female.   Allergies (Ammie Eversole, LPN; [id number] QA348G PM) Gelatin *Assorted Classes** any medication that is gelatin based BEEF  Medication History (Ammie Eversole, LPN; [id number] QA348G PM) Tums 500 (1250MG  Tablet Chewable, Oral) Active. Pepcid (20MG  Tablet, Oral) Active. Claritin (10MG  Tablet, Oral) Active. Menaquinone-7 (100MCG Capsule, Oral) Active. Methylcellulose (Laxative) (475MG  Tablet, Oral) Active. Probiotic Acidophilus (Oral) Active. Vitamin D (Cholecalciferol) (1000UNIT Capsule, Oral) Active. Magnesium Citrate (100MG  Tablet, Oral) Active. Estring (2MG  Ring, Vaginal) Active. Medications Reconciled    Vitals (Ammie Eversole LPN; [id number] 075-GRM PM) 04/10/2015 1:14 PM Weight: 175.8 lb Height: 63in Body Surface Area: 1.83 m Body Mass Index: 31.14 kg/m  Temp.: 98.41F(Oral)  Pulse: 70 (Regular)  BP: 124/72 (Sitting, Left Arm, Standard)      Physical Exam (Christyna Letendre A. Wrigley Winborne MD; 04/10/2015 2:15 PM)  General Mental Status-Alert. General Appearance-Consistent with stated age. Hydration-Well hydrated. Voice-Normal.  Eye Eyeball - Bilateral-Extraocular movements intact. Sclera/Conjunctiva - Bilateral-No scleral icterus.  Breast Note: not repeated today  Neurologic Neurologic evaluation reveals -alert and oriented x 3 with no impairment of recent or remote memory. Mental  Status-Normal.  Musculoskeletal Normal Exam - Left-Upper Extremity Strength Normal and Lower Extremity Strength Normal. Normal Exam - Right-Upper Extremity Strength Normal, Lower Extremity Weakness.    Assessment & Plan (Judiann Celia A. 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 Unclear how much of the density is DCIS but she has multicentric disease that is triple positive. The right side is ER/PR positive but her 2 neu negative. She has seen Dr [name] and I thing chemotherapy first is best way to go. I told her that she may still nedd a mastectomy but reducing the size may open up lumpectomy for her. She would like a shot of conserving her breasts. Discussed role of reconstructions as well. Will schedule for a port placment. Risk of bleeding infection collapse lung heart injury death DVT catheter migration, catheter malfunction and the need for other procedures or surgery. She agrees to proceed.  Current Plans Pt Education - [facility] Portacath HCI Use of a central venous catheter for intravenous therapy was discussed. Technique of catheter placement using ultrasound and fluoroscopy guidance was discussed. Risks such as bleeding, infection, pneumothorax, catheter occlusion, reoperation, and other risks were discussed. I noted a good likelihood this will help address the problem. Questions were answered. The patient expressed understanding & wishes to proceed.

## 2015-04-14 ENCOUNTER — Telehealth: Payer: Self-pay | Admitting: *Deleted

## 2015-04-14 NOTE — Telephone Encounter (Signed)
Spoke to pt concerning new plan of care. Pt to now receive neoadjuvant chemo. Scheduled and confirmed new appt for Dr. [name] on 4/14 at 0900 with lab prior. Informed pt she will need echo and chemo teaching. Pt scheduled for echo on 4/21. Denies further question at this time. Encourage pt to call with needs.

## 2015-04-15 ENCOUNTER — Telehealth: Payer: Self-pay | Admitting: Oncology

## 2015-04-15 ENCOUNTER — Encounter: Payer: Self-pay | Admitting: *Deleted

## 2015-04-15 NOTE — Telephone Encounter (Signed)
Spoke with patient to confirm chem edu class on 4/20

## 2015-04-17 ENCOUNTER — Other Ambulatory Visit: Payer: Self-pay | Admitting: *Deleted

## 2015-04-17 DIAGNOSIS — C50412 Malignant neoplasm of upper-outer quadrant of left female breast: Secondary | ICD-10-CM

## 2015-04-18 ENCOUNTER — Ambulatory Visit (HOSPITAL_BASED_OUTPATIENT_CLINIC_OR_DEPARTMENT_OTHER): Payer: Medicare Other | Admitting: Oncology

## 2015-04-18 ENCOUNTER — Other Ambulatory Visit (HOSPITAL_BASED_OUTPATIENT_CLINIC_OR_DEPARTMENT_OTHER): Payer: Medicare Other

## 2015-04-18 ENCOUNTER — Telehealth: Payer: Self-pay | Admitting: Oncology

## 2015-04-18 VITALS — BP 128/92 | HR 65 | Temp 97.8°F | Resp 18 | Ht 63.5 in | Wt 173.8 lb

## 2015-04-18 DIAGNOSIS — C50511 Malignant neoplasm of lower-outer quadrant of right female breast: Secondary | ICD-10-CM

## 2015-04-18 DIAGNOSIS — C50412 Malignant neoplasm of upper-outer quadrant of left female breast: Secondary | ICD-10-CM

## 2015-04-18 LAB — COMPREHENSIVE METABOLIC PANEL
ALBUMIN: 3.8 g/dL (ref 3.5–5.0)
ALT: 11 U/L (ref 0–55)
AST: 16 U/L (ref 5–34)
Alkaline Phosphatase: 55 U/L (ref 40–150)
Anion Gap: 9 mEq/L (ref 3–11)
BUN: 9.3 mg/dL (ref 7.0–26.0)
CHLORIDE: 107 meq/L (ref 98–109)
CO2: 27 mEq/L (ref 22–29)
Calcium: 10.4 mg/dL (ref 8.4–10.4)
Creatinine: 1.1 mg/dL (ref 0.6–1.1)
EGFR: 51 mL/min/{1.73_m2} — ABNORMAL LOW (ref >90)
GLUCOSE: 90 mg/dL (ref 70–140)
POTASSIUM: 4.4 meq/L (ref 3.5–5.1)
Sodium: 142 mEq/L (ref 136–145)
Total Bilirubin: 0.47 mg/dL (ref 0.20–1.20)
Total Protein: 7.2 g/dL (ref 6.4–8.3)

## 2015-04-18 LAB — CBC WITH DIFFERENTIAL/PLATELET
BASO%: 1.1 % (ref 0.0–2.0)
BASOS ABS: 0.1 10*3/uL (ref 0.0–0.1)
EOS ABS: 0.2 10*3/uL (ref 0.0–0.5)
EOS%: 4.4 % (ref 0.0–7.0)
HEMATOCRIT: 40.2 % (ref 34.8–46.6)
HGB: 13.4 g/dL (ref 11.6–15.9)
LYMPH#: 1.5 10*3/uL (ref 0.9–3.3)
LYMPH%: 28.6 % (ref 14.0–49.7)
MCH: 28.9 pg (ref 25.1–34.0)
MCHC: 33.3 g/dL (ref 31.5–36.0)
MCV: 86.9 fL (ref 79.5–101.0)
MONO#: 0.6 10*3/uL (ref 0.1–0.9)
MONO%: 12 % (ref 0.0–14.0)
NEUT#: 2.8 10*3/uL (ref 1.5–6.5)
NEUT%: 53.9 % (ref 38.4–76.8)
PLATELETS: 210 10*3/uL (ref 145–400)
RBC: 4.62 10*6/uL (ref 3.70–5.45)
RDW: 14.1 % (ref 11.2–14.5)
WBC: 5.2 10*3/uL (ref 3.9–10.3)

## 2015-04-18 MED ORDER — PROCHLORPERAZINE MALEATE 10 MG PO TABS: 10.0000 mg | ORAL_TABLET | Freq: Four times a day (QID) | ORAL | Status: DC | PRN

## 2015-04-18 MED ORDER — LIDOCAINE-PRILOCAINE 2.5-2.5 % EX CREA: TOPICAL_CREAM | CUTANEOUS | Status: DC

## 2015-04-18 MED ORDER — LORAZEPAM 0.5 MG PO TABS: 0.5000 mg | ORAL_TABLET | Freq: Every evening | ORAL | Status: DC | PRN

## 2015-04-18 MED ORDER — ONDANSETRON HCL 8 MG PO TABS: 8.0000 mg | ORAL_TABLET | Freq: Two times a day (BID) | ORAL | Status: DC | PRN

## 2015-04-18 NOTE — Progress Notes (Signed)
[place]  [phone]) [phone] Fax:(336) [phone]     ID: Isabella Cook DOB: Apr 08, 1940  MR#: [id number]  XIP#:[id number]  Patient Care Team: [name], FNP as PCP - General (Family Medicine) [name], MD as Consulting Physician (Oncology) [name], MD as Consulting Physician (General Surgery) [name], NP as Nurse Practitioner (Hematology and Oncology) [name], MD as Consulting Physician (Oncology) Thea Silversmith, MD as Consulting Physician (Radiation Oncology) [name], MD as Consulting Physician (Neurosurgery) [name], MD as Referring Physician (Otolaryngology) PCP: [name], FNP OTHER MD:  CHIEF COMPLAINT: HER-2 positive breast cancer, bilateral beast cancers  CURRENT TREATMENT: Workup in progress   BREAST CANCER HISTORY:   From the original intake note:  Jaryah had routine screening mammography at [facility] suggesting an area of indeterminate microcalcifications in the upper outer quadrant of the left breast measuring 8 mm. She was referred for left breast stereotactic biopsy performed at the [facility] 02/27/2015. This showed ([id number]) invasive ductal carcinoma, grade 2, estrogen receptor 100% positive, progesterone receptor 100% positive, both with strong staining intensity, with an MIB-1 of 20%, and HER-2 amplified with a signals ratio of 2.53, the number per cell being 2.65. 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  On 03/25/2015 the patient underwent bilateral breast MRI, which showed the breast density to be category B. this showed, in the left breast, an area of non-masslike enhancement measuring 7.8 cm. Associated with this were 2 adjacent irregular masses measuring 1.7 and 1.4 cm. The masses were 5 cm anterior and superior to the biopsy clip. There was a cortically thickened left axillary lymph node noted.  Also in the right breast there was a 7 mm irregular  enhancing mass at the 6:30 o'clock position.  Her subsequent history is as detailed below    INTERVAL HISTORY: Isabella Cook returns today for further evaluation of her bilateral breast cancers. After the last visit here we proceeded to biopsy of an area of non-masslike enhancement in the upper outer quadrant of her left breast, anteriorly. It showed atypical ductal hyperplasia. We also biopsied the right breast lower outer quadrant mass seen on MRI. This proved to be an invasive ductal carcinoma, E-cadherin positive, grade 2, estrogen receptor 100% positive, progesterone receptor 80% positive, both with strong staining intensity, with HER-2 not amplified, the signals ratio being 1.41 and the number per cell 2.25, with a proliferation marker of 4%.  She was also scheduled for biopsy of a suspicious left axillary lymph node, but second look ultrasound of the left axilla 04/04/2015 showed only normal-appearing lymph nodes  so the biopsy was canceled.   She is here today to discuss neoadjuvant chemotherapy.  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 She is very active with housework and gardening, but does not otherwise exercise regularly. Otherwise a detailed review of systems today was entirely negative.  PAST MEDICAL HISTORY: Past Medical History  Diagnosis Date  . GERD (gastroesophageal reflux disease)   . Arthritis     wrists and knees  . Cancer [facility]) 03-2015    left breast  . Multiple food allergies     PAST SURGICAL HISTORY: Past Surgical History  Procedure Laterality Date  . Abdominal hysterectomy    . Cholecystectomy    . Appendectomy    . Tubal ligation    . Back surgery      lumbar  . Hernia  repair      UHR  . Cervical disc surgery  2016    FAMILY HISTORY No family history on file. The patient's father had a history of pseudo-bulbar pulse 54 and died from a stroke at age 3. The  patient's mother died at age 72 from complications of emphysema. The patient had no brothers, 2 sisters. There is no history of cancer in the family and specifically no history of breast or ovarian cancer.  GYNECOLOGIC HISTORY:  No LMP recorded. Patient has had a hysterectomy. Menarche age 88, first live birth age 70. She is GX P2. She underwent a hysterectomy with left salpingo-oophorectomy in 1984. She received hormone replacement for approximately 15 years, until 2005.  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 Her husband [name] is a retired Social research officer, government. Daughter [name] lives in [place] where she is a housewife, homeschooling her children. Son Jamelia Varano lives in [place] and works in Chartered certified accountant. The patient has 2 grandchildren. She is not a church attender    ADVANCED DIRECTIVES: In place   HEALTH MAINTENANCE: Social History  Substance Use Topics  . Smoking status: Never Smoker   . Smokeless tobacco: Not on file  . Alcohol Use: Yes     Comment: occ     Colonoscopy:2014  PAP:  Bone density: 2017 [facility]//osteopenia   Lipid panel:  Allergies  Allergen Reactions  . Dairy Aid [Lactase] Anaphylaxis     Takes claritin every day and benadryl, if needed, to prevent anaphylaxis   . Eggs Or Egg-Derived Products Anaphylaxis    Nasal stuffiness, Takes claritin every day and benadryl, if needed, to prevent anaphylaxis  . Gelatin (Bovine) [Beef Extract] Anaphylaxis    Muscle pain,  Takes claritin every day and benadryl, if needed, to prevent anaphylaxis   . Gluten Meal Anaphylaxis and Diarrhea    Takes claritin every day and benadryl, if needed, to prevent anaphylaxis   . Lambs Quarters Anaphylaxis    ALL "mammal" MEAT per pt -- Muscle pain  Can eat chicken, fish, [place]  . Milk-Related Compounds Anaphylaxis    Nasal stuffiness Cheese (mozzarella, swiss, cheddar,  cottage)  . Pork Allergy Anaphylaxis    ALL "mammal" MEAT per pt -- Muscle pain  Can eat chicken, fish, [place]  . Wheat Bran Anaphylaxis and Diarrhea     Takes claritin every day and benadryl, if needed, to prevent anaphylaxis   . Whey Anaphylaxis and Diarrhea     Takes claritin every day and benadryl, if needed, to prevent anaphylaxis   . Yeast Anaphylaxis     Takes claritin every day and benadryl, if needed, to prevent anaphylaxis  . Soy Allergy Diarrhea  . Almond Oil Nausea And Vomiting    Current Outpatient Prescriptions  Medication Sig Dispense Refill  . calcium carbonate (TUMS - DOSED IN MG ELEMENTAL CALCIUM) 500 MG chewable tablet Chew 500 mg by mouth daily as needed for indigestion or heartburn.     . famotidine (PEPCID) 20 MG tablet Take 20 mg by mouth daily.     Marland Kitchen lidocaine-prilocaine (EMLA) cream Apply to affected area once 30 g 3  . loratadine (CLARITIN) 10 MG tablet Take 10 mg by mouth daily.    Marland Kitchen LORazepam (ATIVAN) 0.5 MG tablet Take 1 tablet (0.5 mg total) by mouth at bedtime as needed (Nausea or vomiting). 15 tablet 0  . Magnesium Citrate 100 MG TABS Take 200 mg  by mouth daily.    . Methylcellulose, Laxative, (CITRUCEL) 500 MG TABS Take 2,000 mg by mouth 2 (two) times daily.    . ondansetron (ZOFRAN) 8 MG tablet Take 1 tablet (8 mg total) by mouth 2 (two) times daily as needed (Nausea or vomiting). 30 tablet 1  . Polyethyl Glycol-Propyl Glycol (SYSTANE ULTRA) 0.4-0.3 % SOLN Place 1 drop into both eyes daily as needed (dry eyes).    . Probiotic Product (ALIGN PO) Take 1 tablet by mouth daily.     . prochlorperazine (COMPAZINE) 10 MG tablet Take 1 tablet (10 mg total) by mouth every 6 (six) hours as needed (Nausea or vomiting). 30 tablet 1  . VITAMIN D-VITAMIN K PO Take 1 capsule by mouth daily. Reported on 04/15/2015     No current facility-administered medications for this visit.    OBJECTIVE: Middle-aged white woman In no acute distress Filed Vitals:   04/18/15 0855   BP: 128/92  Pulse: 65  Temp: 97.8 F (36.6 C)  Resp: 18     Body mass index is 30.3 kg/(m^2).    ECOG FS:0 - Asymptomatic  Sclerae unicteric, pupils round and equal Oropharynx clear and moist-- no thrush or other lesions No cervical or supraclavicular adenopathy Lungs no rales or rhonchi Heart regular rate and rhythm Abd soft, nontender, positive bowel sounds MSK no focal spinal tenderness, no upper extremity lymphedema Neuro: nonfocal, well oriented, appropriate affect Breasts: The right breast is status post biopsy. There is no palpable mass. There is no skin or nipple changes of concern. The right axilla is benign. The left breast is also status post biopsy.Before I was able to feel a density associated with the biopsy, that has resolved and likely was swelling secondary to the procedure. Today I do not feel a palpable mass in the breast. There are no skin or nipple changes of concern. The left axilla is benign  LAB RESULTS:  CMP     Component Value Date/Time   NA 142 04/18/2015 0837   K 4.4 04/18/2015 0837   CO2 27 04/18/2015 0837   GLUCOSE 90 04/18/2015 0837   BUN 9.3 04/18/2015 0837   CREATININE 1.1 04/18/2015 0837   CALCIUM 10.4 04/18/2015 0837   PROT 7.2 04/18/2015 0837   ALBUMIN 3.8 04/18/2015 0837   AST 16 04/18/2015 0837   ALT 11 04/18/2015 0837   ALKPHOS 55 04/18/2015 0837   BILITOT 0.47 04/18/2015 0837    INo results found for: SPEP, UPEP  Lab Results  Component Value Date   WBC 5.2 04/18/2015   NEUTROABS 2.8 04/18/2015   HGB 13.4 04/18/2015   HCT 40.2 04/18/2015   MCV 86.9 04/18/2015   PLT 210 04/18/2015      Chemistry      Component Value Date/Time   NA 142 04/18/2015 0837   K 4.4 04/18/2015 0837   CO2 27 04/18/2015 0837   BUN 9.3 04/18/2015 0837   CREATININE 1.1 04/18/2015 0837      Component Value Date/Time   CALCIUM 10.4 04/18/2015 0837   ALKPHOS 55 04/18/2015 0837   AST 16 04/18/2015 0837   ALT 11 04/18/2015 0837   BILITOT 0.47  04/18/2015 0837       No results found for: LABCA2  No components found for: [id number]  No results for input(s): INR in the last 168 hours.  Urinalysis No results found for: COLORURINE, APPEARANCEUR, LABSPEC, PHURINE, GLUCOSEU, HGBUR, BILIRUBINUR, KETONESUR, PROTEINUR, UROBILINOGEN, NITRITE, LEUKOCYTESUR    ELIGIBLE FOR AVAILABLE RESEARCH PROTOCOL: no  STUDIES: Mr Breast Bilateral W Wo Contrast  03/25/2015  CLINICAL DATA:  Patient with recent diagnosis of left breast grade 2 invasive ductal carcinoma with associated ductal carcinoma in situ. EXAM: BILATERAL BREAST MRI WITH AND WITHOUT CONTRAST TECHNIQUE: Multiplanar, multisequence MR images of both breasts were obtained prior to and following the intravenous administration of 16 ml of MultiHance. THREE-DIMENSIONAL MR IMAGE RENDERING ON INDEPENDENT WORKSTATION: Three-dimensional MR images were rendered by post-processing of the original MR data on an independent workstation. The three-dimensional MR images were interpreted, and findings are reported in the following complete MRI report for this study. Three dimensional images were evaluated at the independent DynaCad workstation COMPARISON:  Previous exam(s). FINDINGS: Breast composition: b.  Scattered fibroglandular tissue. Background parenchymal enhancement: Mild Right breast: Within the 630 o'clock right breast middle depth there is a 7 mm irregular enhancing mass (image 118; series 7). No additional concerning areas of enhancement are identified within the right breast. Left breast: Susceptibility artifact is demonstrated within the lower outer left breast middle to posterior depth compatible with biopsy marking clip. Extending anterior and superior from this biopsy marking clip is a 7.8 x 3.6 x 3.3 cm area of clumped non mass enhancement. Along the anterior superior margin of this clumped non mass enhancement there are 2 adjacent irregular enhancing masses measuring 1.7 x 1.0 x 1.1 cm and 1.4  x 1.1 x 1.2 cm. These masses are located within the upper-outer left breast and are approximately 8 mm apart. These masses are located approximately 5 cm anterior and superior to the biopsy marking clip on sagittal imaging. Lymph nodes: There is a cortically thickened left axillary lymph node (image 64; series 4). Ancillary findings:  None. IMPRESSION: Large area of clumped non mass enhancement throughout the lower outer and upper outer left breast concerning for extension of disease throughout the lateral left breast given history of recently biopsied left breast invasive ductal carcinoma. Two adjacent irregular enhancing masses within the upper-outer left breast along the cranial margin of the non mass enhancement, concerning for associated disease, likely invasive given the imaging characteristics. Irregular suspicious enhancing mass within the 630 o'clock right breast. Cortically thickened left axillary lymph node. RECOMMENDATION: MRI guided core needle biopsy irregular enhancing mass within the lower outer right breast. If left breast conservation is desired, at the time of the above recommended right breast MRI guided core needle biopsy, MRI guided core needle biopsy of the furthest extent of disease within the upper-outer left breast can be obtained. Second-look ultrasound and possible biopsy of cortically thickened left axillary lymph node. BI-RADS CATEGORY  4: Suspicious. Electronically Signed   By: Lovey Newcomer M.D.   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 Pre-treatment MRI demonstrated bilateral breast masses and a possible solitary left axillary node with cortical thickening. 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 Second-look ultrasound of the left axilla has been requested to see if an abnormal lymph node is present and if so, biopsy is requested. EXAM: ULTRASOUND OF THE LEFT AXILLA COMPARISON:  Diagnostic bilateral breast MRI 03/25/2015. Multiple recent prior mammograms and left breast ultrasound 02/18/2015  from [facility]. No prior left axillary ultrasound. FINDINGS: On physical exam, there is no palpable lymphadenopathy in the left axilla. Ultrasound is performed, showing several normal-appearing lymph nodes in the left axilla, including a lymph node measures approximately 2.6 cm in length. One of the lymph nodes has cortical thickening up to 2 mm, within normal limits. No pathologic left axillary lymph nodes are visible by ultrasound. IMPRESSION: No pathologic left axillary lymphadenopathy by ultrasound. RECOMMENDATION: Treatment plan for the known bilateral breast cancers. I have discussed the findings and recommendations with the patient. Results were also provided in writing at the conclusion of the visit. BI-RADS CATEGORY  6: Known biopsy-proven malignancy. Electronically Signed   By: Evangeline Dakin M.D.   On: 04/04/2015 14:53   Mm Digital Diagnostic Bilat  04/02/2015  CLINICAL DATA:  Status post MRI guided core biopsies bilaterally. EXAM: DIAGNOSTIC BILATERAL MAMMOGRAM POST MRI BIOPSY COMPARISON:  Previous exam(s). FINDINGS: Mammographic images were obtained following MRI guided biopsy of area of enhancement within the 630 o'clock location of the right breast. A cylinder-shaped clip is identified in the lower outer quadrant of the right breast, in expected location. Following biopsy of an enhancing mass the upper-outer quadrant, anterior left breast, a bow tie shaped clip is identified in the anterior upper outer quadrant as expected. This clip is  approximately 6.3 cm anterior to the clip placed the time of recent diagnosis malignancy. IMPRESSION: Tissue marker clips are in expected locations bilaterally. Final Assessment: Post Procedure Mammograms for Marker Placement Electronically Signed   By: Nolon Nations M.D.   On: 04/02/2015 10:01   Mr Aundra Millet Breast Bx Johnella Moloney Dev 1st Lesion Image Bx Spec Mr Guide  04/07/2015  ADDENDUM REPORT: 04/04/2015 09:05 ADDENDUM: Pathology revealed ATYPICAL DUCTAL HYPERPLASIA of the upper outer anterior Left breast which borders on DUCTAL CARCINOMA IN SITU. GRADE II INVASIVE MAMMARY CARCINOMA AND MAMMARY CARCINOMA IN SITU of the lower outer Right breast. This was found to be concordant by Dr. Nolon Nations. Pathology results were discussed with the patient by telephone. The patient reported doing well after the biopsies with tenderness at the sites. Post biopsy instructions and care were reviewed and questions were answered. The patient was encouraged to call [facility] for any additional concerns. The patient has a recent diagnosis of left breast cancer and should follow her outlined treatment plan. She is scheduled for a Left axillary node ultrasound guided biopsy on April 04, 2015. Pathology results were also called to Dr. [name] Cornett's office. Pathology results reported by Terie Purser, RN on 04/04/2015. Electronically Signed   By: Nolon Nations M.D.   On: 04/04/2015 09:05  04/07/2015  CLINICAL DATA:  Patient presents for MR guided core biopsies of lesions bilaterally. Recently diagnosed grade 2 invasive ductal carcinoma with associated calcifications diagnosed by stereotactic guided core biopsy of calcifications in the posterior upper outer quadrant of the left breast. EXAM: MR GUIDED CORE NEEDLE BIOPSY OF THE LEFT BREAST MRI GUIDED CORE NEEDLE BIOPSY OF THE RIGHT BREAST TECHNIQUE: Multiplanar, multisequence MR imaging of bilateral breasts was performed both before and after administration  of intravenous contrast. CONTRAST:  37m MULTIHANCE GADOBENATE DIMEGLUMINE 529 MG/ML IV SOLN COMPARISON:  Previous exams. FINDINGS: I met with the patient, and we discussed the procedure of MRI guided biopsy, including risks, benefits, and alternatives. Specifically, we discussed the risks of infection, bleeding, tissue injury, clip migration, and inadequate sampling. Informed, written consent was given. The usual time out protocol was performed immediately  prior to the procedure. SITE 1, LEFT BREAST, BOW TIE SHAPED CLIP: Using sterile technique, 1% Lidocaine, MRI guidance, and a 9 gauge vacuum assisted device, biopsy was performed of enhancing mass within the anterior upper outer quadrant of the left breast using a lateral approach. At the conclusion of the procedure, a bow tie shaped tissue marker clip was deployed into the biopsy cavity. SITE 2, RIGHT BREAST, CYLINDER-SHAPED CLIP: Using the same technique, 1% lidocaine, MR guidance, and a 9 gauge vacuum assisted device, biopsy was performed of a small enhancing mass within the lower outer quadrant of the right breast using a lateral approach. At the conclusion of the procedure a cylinder-shaped clip was deployed at the biopsy site. Follow-up 2-view mammogram was performed and dictated separately. IMPRESSION: MRI guided biopsy of single lesion in each breast. No apparent complications. Electronically Signed: By: Nolon Nations M.D. On: 04/02/2015 10:15   Mr Rt Breast Bx Johnella Moloney Dev 1st Lesion Image Bx Spec Mr Guide  04/07/2015  ADDENDUM REPORT: 04/04/2015 09:05 ADDENDUM: Pathology revealed ATYPICAL DUCTAL HYPERPLASIA of the upper outer anterior Left breast which borders on DUCTAL CARCINOMA IN SITU. GRADE II INVASIVE MAMMARY CARCINOMA AND MAMMARY CARCINOMA IN SITU of the lower outer Right breast. This was found to be concordant by Dr. Nolon Nations. Pathology results were discussed with the patient by telephone. The patient reported doing well after the  biopsies with tenderness at the sites. Post biopsy instructions and care were reviewed and questions were answered. The patient was encouraged to call [facility] [place] for any additional concerns. The patient has a recent diagnosis of left breast cancer and should follow her outlined treatment plan. She is scheduled for a Left axillary node ultrasound guided biopsy on April 04, 2015. Pathology results were also called to Dr. [name] Cornett's office. Pathology results reported by Terie Purser, RN on 04/04/2015. Electronically Signed   By: Nolon Nations M.D.   On: 04/04/2015 09:05  04/07/2015  CLINICAL DATA:  Patient presents for MR guided core biopsies of lesions bilaterally. Recently diagnosed grade 2 invasive ductal carcinoma with associated calcifications diagnosed by stereotactic guided core biopsy of calcifications in the posterior upper outer quadrant of the left breast. EXAM: MR GUIDED CORE NEEDLE BIOPSY OF THE LEFT BREAST MRI GUIDED CORE NEEDLE BIOPSY OF THE RIGHT BREAST TECHNIQUE: Multiplanar, multisequence MR imaging of bilateral breasts was performed both before and after administration of intravenous contrast. CONTRAST:  69m MULTIHANCE GADOBENATE DIMEGLUMINE 529 MG/ML IV SOLN COMPARISON:  Previous exams. FINDINGS: I met with the patient, and we discussed the procedure of MRI guided biopsy, including risks, benefits, and alternatives. Specifically, we discussed the risks of infection, bleeding, tissue injury, clip migration, and inadequate sampling. Informed, written consent was given. The usual time out protocol was performed immediately prior to the procedure. SITE 1, LEFT BREAST, BOW TIE SHAPED CLIP: Using sterile technique, 1% Lidocaine, MRI guidance, and a 9 gauge vacuum assisted device, biopsy was performed of enhancing mass within the anterior upper outer quadrant of the left breast using a lateral approach. At the conclusion of the procedure, a bow tie shaped tissue marker  clip was deployed into the biopsy cavity. SITE 2, RIGHT BREAST, CYLINDER-SHAPED CLIP: Using the same technique, 1% lidocaine, MR guidance, and a 9 gauge vacuum assisted device, biopsy was performed of a small enhancing mass within the lower outer quadrant of the right breast using a lateral approach. At the conclusion of the procedure a cylinder-shaped clip was deployed at  the biopsy site. Follow-up 2-view mammogram was performed and dictated separately. IMPRESSION: MRI guided biopsy of single lesion in each breast. No apparent complications. Electronically Signed: By: Nolon Nations M.D. On: 04/02/2015 10:15    ASSESSMENT: 75 y.o. [facility] woman  status post left breast biopsy 02/27/2015 for a clinical mT1-3 N1 invasive ductal carcinoma, grade 2, strongly estrogen and progesterone receptor positive, HER-2 amplified, with an MIB-1 of 20%.   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  (b) right breast lower outer quadrant: a clinical T1b N0, stage IA invasive ductal carcinoma, estrogen and progesterone receptor positive, HER-2 not amplified, with an Mib-1 of 4%  (2)  neoadjuvant chemotherapy will consist of weekly paclitaxel 12 together with weekly trastuzumab 12  (3) trastuzumab will be continued every 3 weeks for an additional 9 months after completion of chemotherapy  (4) definitive surgery to follow chemotherapy  (5) adjuvant radiation therapy to follow surgery  (6) tamoxifen to start at the completion of local treatment  (a) the patient has been asked to discontinue Estring as of 05/05/2015, to be resumed when  tamoxifen is started   PLAN: We spent approximately 50 minutes going over [place] situation in detail. She does not appear to have lymph node spread, and the largest contiguous mass that we biopsied is less than 2 cm radiographically. Accordingly she does not qualify for pertuzumab.  I think she would do very well with weekly paclitaxel and weekly trastuzumab 12. We discussed the possible toxicities, side effects and complications of this combination. It has been shown to be very effective in older patients with early-stage disease as she appears to be.  Once she completes the radiation she will have her surgery. I am not sure whether we will be able to save her left breast even with neoadjuvant treatment, but we will obtain a repeat breast MRI prior to surgery and she will discuss the options further with Dr. [name] Stage at that time.   After surgery she will need radiation at least to the right breast and possibly also to the left. Once she completes radiation she will start antiestrogen's.  Today we had a preliminary discussion regarding anti-estrogen choices. Because she is status post hysterectomy and because she benefits from the use of Estring I think tamoxifen would be a good choice for her.  I went ahead and wrote her the prescriptions for lorazepam, ondansetron and prochlorperazine, as well as the numbing cream. She will take the ondansetron the evening of chemotherapy and the next morning and after that as needed. The prochlorperazine is as needed. She understands the possible side effects of these anti-emetics. The lorazepam is in case she cannot sleep because of steroids. She will be instructed on how to use the EMLA cream when she comes to "chemotherapy school" next week.  She is already scheduled for an echocardiogram next week and a discussion with cardio oncology. She will have a port placed 04/22/2015.  She will return to see me on her first day of treatment, 04/29/2015. I have entered  all the relevant orders and visits. She knows to call for any problems that may develop before her  next visit here.  [name], MD   04/18/2015 9:52 AM Medical Oncology and Hematology [facility] [address] [place], [place] [address] Tel. [phone]    Fax. [phone]

## 2015-04-18 NOTE — Telephone Encounter (Signed)
appt madeper 4/14 pof. Staff message sent to override GM 4/25 and 5/2 schedule. Pt will be notified once done

## 2015-04-21 ENCOUNTER — Encounter (HOSPITAL_COMMUNITY)
Admission: RE | Admit: 2015-04-21 | Discharge: 2015-04-21 | Disposition: A | Discharge status: Home / Self Care | Payer: Medicare Other | Source: Ambulatory Visit | Attending: Surgery | Admitting: Surgery

## 2015-04-21 ENCOUNTER — Encounter (HOSPITAL_COMMUNITY): Payer: Self-pay

## 2015-04-21 ENCOUNTER — Encounter: Payer: Self-pay | Admitting: Oncology

## 2015-04-21 DIAGNOSIS — Z79899 Other long term (current) drug therapy: Secondary | ICD-10-CM | POA: Diagnosis not present

## 2015-04-21 DIAGNOSIS — Z170 Estrogen receptor positive status [ER+]: Secondary | ICD-10-CM | POA: Diagnosis not present

## 2015-04-21 DIAGNOSIS — C50911 Malignant neoplasm of unspecified site of right female breast: Secondary | ICD-10-CM | POA: Diagnosis not present

## 2015-04-21 DIAGNOSIS — C50412 Malignant neoplasm of upper-outer quadrant of left female breast: Secondary | ICD-10-CM | POA: Diagnosis not present

## 2015-04-21 DIAGNOSIS — Z79890 Hormone replacement therapy (postmenopausal): Secondary | ICD-10-CM | POA: Diagnosis not present

## 2015-04-21 DIAGNOSIS — K219 Gastro-esophageal reflux disease without esophagitis: Secondary | ICD-10-CM | POA: Diagnosis not present

## 2015-04-21 HISTORY — DX: Personal history of other diseases of the digestive system: Z87.19

## 2015-04-21 HISTORY — DX: Urinary tract infection, site not specified: N39.0

## 2015-04-21 HISTORY — DX: Personal history of other specified conditions: Z87.898

## 2015-04-21 MED ORDER — CEFAZOLIN SODIUM-DEXTROSE 2-4 GM/100ML-% IV SOLN
2.0000 g | INTRAVENOUS | Status: AC
  Administered 2015-04-22: 2 g via INTRAVENOUS
  Filled 2015-04-21: qty 100

## 2015-04-21 NOTE — Progress Notes (Signed)
Sent prior auth for lorazepam

## 2015-04-21 NOTE — Progress Notes (Signed)
Per [employer] Status:Approved;Coverage Start Date:03/22/2015;Coverage End Date:04/20/2016; [id number]

## 2015-04-21 NOTE — Progress Notes (Signed)
PCP is [name], FNP Denies ever seeing a cardiologist. States she has an echo scheduled 04-25-15 Denies ever having a card cath or stress test.

## 2015-04-21 NOTE — Pre-Procedure Instructions (Signed)
Isabella Cook  04/21/2015      [id number] - [place] [address] Phone: [phone] Fax: [phone]    Your procedure is scheduled on Tuesday, April 18th   Report to [facility] Admitting at  12:30 Pm             (Posted surgery time 2:28 pm to 3:32 pm)   Call this number if you have problems the morning of surgery:  [phone]   Remember:  Do not eat food or drink liquids after midnight tonight.   Take these medicines the morning of surgery with A SIP OF WATER : Pepcid, Claritin.              4-5 days prior to surgery, STOP taking any herbal supplements, vitamins, anti-inflammatories   Do not wear jewelry, make-up or nail polish.  Do not wear lotions, powders, or perfumes.     Do not shave 48 hours prior to surgery.   Do not bring valuables to the hospital.  [facility] is not responsible for any belongings or valuables.  Contacts, dentures or bridgework may not be worn into surgery.  Leave your suitcase in the car.  After surgery it may be brought to your room.  For patients admitted to the hospital, discharge time will be determined by your treatment team.  Name and phone number of your driver:     Please read over the following fact sheets that you were given. Pain Booklet, Coughing and Deep Breathing and Surgical Site Infection Prevention

## 2015-04-22 ENCOUNTER — Ambulatory Visit (HOSPITAL_COMMUNITY): Payer: Medicare Other | Admitting: Critical Care Medicine

## 2015-04-22 ENCOUNTER — Encounter (HOSPITAL_COMMUNITY): Payer: Self-pay | Admitting: *Deleted

## 2015-04-22 ENCOUNTER — Ambulatory Visit (HOSPITAL_COMMUNITY): Payer: Medicare Other

## 2015-04-22 ENCOUNTER — Ambulatory Visit (HOSPITAL_COMMUNITY)
Admission: RE | Admit: 2015-04-22 | Discharge: 2015-04-22 | Disposition: A | Discharge status: Home / Self Care | Payer: Medicare Other | Source: Ambulatory Visit | Attending: Surgery | Admitting: Surgery

## 2015-04-22 ENCOUNTER — Encounter (HOSPITAL_COMMUNITY)
Admission: RE | Disposition: A | Discharge status: Home / Self Care | Payer: Self-pay | Source: Ambulatory Visit | Attending: Surgery

## 2015-04-22 DIAGNOSIS — Z79890 Hormone replacement therapy (postmenopausal): Secondary | ICD-10-CM | POA: Insufficient documentation

## 2015-04-22 DIAGNOSIS — Z170 Estrogen receptor positive status [ER+]: Secondary | ICD-10-CM | POA: Insufficient documentation

## 2015-04-22 DIAGNOSIS — K219 Gastro-esophageal reflux disease without esophagitis: Secondary | ICD-10-CM | POA: Insufficient documentation

## 2015-04-22 DIAGNOSIS — Z79899 Other long term (current) drug therapy: Secondary | ICD-10-CM | POA: Insufficient documentation

## 2015-04-22 DIAGNOSIS — Z95828 Presence of other vascular implants and grafts: Secondary | ICD-10-CM

## 2015-04-22 DIAGNOSIS — Z419 Encounter for procedure for purposes other than remedying health state, unspecified: Secondary | ICD-10-CM

## 2015-04-22 DIAGNOSIS — C50912 Malignant neoplasm of unspecified site of left female breast: Secondary | ICD-10-CM

## 2015-04-22 DIAGNOSIS — C50911 Malignant neoplasm of unspecified site of right female breast: Secondary | ICD-10-CM | POA: Diagnosis not present

## 2015-04-22 DIAGNOSIS — C50412 Malignant neoplasm of upper-outer quadrant of left female breast: Secondary | ICD-10-CM | POA: Insufficient documentation

## 2015-04-22 HISTORY — PX: PORTACATH PLACEMENT: SHX2246

## 2015-04-22 SURGERY — INSERTION, TUNNELED CENTRAL VENOUS DEVICE, WITH PORT
Anesthesia: General | Site: Chest | Laterality: Right

## 2015-04-22 MED ORDER — HEPARIN SOD (PORK) LOCK FLUSH 100 UNIT/ML IV SOLN
INTRAVENOUS | Status: DC | PRN
  Administered 2015-04-22: 500 [IU] via INTRAVENOUS

## 2015-04-22 MED ORDER — OXYCODONE HCL 5 MG/5ML PO SOLN: 5.0000 mg | Freq: Once | ORAL | Status: DC | PRN

## 2015-04-22 MED ORDER — DEXAMETHASONE SODIUM PHOSPHATE 10 MG/ML IJ SOLN
INTRAMUSCULAR | Status: DC | PRN
  Administered 2015-04-22: 10 mg via INTRAVENOUS

## 2015-04-22 MED ORDER — SODIUM CHLORIDE 0.9 % IV SOLN
INTRAVENOUS | Status: DC | PRN
  Administered 2015-04-22: 14:00:00

## 2015-04-22 MED ORDER — OXYCODONE-ACETAMINOPHEN 5-325 MG PO TABS
1.0000 | ORAL_TABLET | ORAL | Status: DC | PRN
Start: 2015-04-22 — End: 2015-04-29

## 2015-04-22 MED ORDER — LIDOCAINE HCL (CARDIAC) 20 MG/ML IV SOLN
INTRAVENOUS | Status: DC | PRN
  Administered 2015-04-22: 40 mg via INTRAVENOUS

## 2015-04-22 MED ORDER — PROPOFOL 10 MG/ML IV BOLUS
INTRAVENOUS | Status: AC
  Filled 2015-04-22: qty 20

## 2015-04-22 MED ORDER — OXYCODONE-ACETAMINOPHEN 5-325 MG PO TABS: 1.0000 | ORAL_TABLET | ORAL | Status: DC | PRN

## 2015-04-22 MED ORDER — BUPIVACAINE-EPINEPHRINE 0.25% -1:200000 IJ SOLN
INTRAMUSCULAR | Status: DC | PRN
  Administered 2015-04-22: 8 mL

## 2015-04-22 MED ORDER — 0.9 % SODIUM CHLORIDE (POUR BTL) OPTIME
TOPICAL | Status: DC | PRN
  Administered 2015-04-22: 1000 mL

## 2015-04-22 MED ORDER — BUPIVACAINE-EPINEPHRINE (PF) 0.25% -1:200000 IJ SOLN
INTRAMUSCULAR | Status: AC
  Filled 2015-04-22: qty 30

## 2015-04-22 MED ORDER — OXYCODONE HCL 5 MG PO TABS: 5.0000 mg | ORAL_TABLET | Freq: Once | ORAL | Status: DC | PRN

## 2015-04-22 MED ORDER — FENTANYL CITRATE (PF) 100 MCG/2ML IJ SOLN: 25.0000 ug | INTRAMUSCULAR | Status: DC | PRN

## 2015-04-22 MED ORDER — LACTATED RINGERS IV SOLN
INTRAVENOUS | Status: DC
  Administered 2015-04-22: 50 mL/h via INTRAVENOUS

## 2015-04-22 MED ORDER — ACETAMINOPHEN 160 MG/5ML PO SOLN: 325.0000 mg | ORAL | Status: DC | PRN

## 2015-04-22 MED ORDER — OXYCODONE HCL 5 MG PO TABS
5.0000 mg | ORAL_TABLET | Freq: Once | ORAL | Status: DC | PRN
Start: 2015-04-22 — End: 2015-04-22

## 2015-04-22 MED ORDER — PROPOFOL 10 MG/ML IV BOLUS
INTRAVENOUS | Status: DC | PRN
  Administered 2015-04-22: 120 mg via INTRAVENOUS

## 2015-04-22 MED ORDER — ETOMIDATE 2 MG/ML IV SOLN
INTRAVENOUS | Status: DC | PRN
  Administered 2015-04-22: 15 mg via INTRAVENOUS

## 2015-04-22 MED ORDER — ACETAMINOPHEN 325 MG PO TABS: 325.0000 mg | ORAL_TABLET | ORAL | Status: DC | PRN

## 2015-04-22 MED ORDER — FENTANYL CITRATE (PF) 100 MCG/2ML IJ SOLN
25.0000 ug | INTRAMUSCULAR | Status: DC | PRN
Start: 2015-04-22 — End: 2015-04-22

## 2015-04-22 MED ORDER — FENTANYL CITRATE (PF) 250 MCG/5ML IJ SOLN
INTRAMUSCULAR | Status: AC
  Filled 2015-04-22: qty 5

## 2015-04-22 MED ORDER — HEPARIN SOD (PORK) LOCK FLUSH 100 UNIT/ML IV SOLN
INTRAVENOUS | Status: AC
  Filled 2015-04-22: qty 5

## 2015-04-22 MED ORDER — ONDANSETRON HCL 4 MG/2ML IJ SOLN: 4.0000 mg | Freq: Once | INTRAMUSCULAR | Status: DC | PRN

## 2015-04-22 MED ORDER — ONDANSETRON HCL 4 MG/2ML IJ SOLN
INTRAMUSCULAR | Status: DC | PRN
  Administered 2015-04-22: 4 mg via INTRAVENOUS

## 2015-04-22 MED ORDER — DIPHENHYDRAMINE HCL 50 MG/ML IJ SOLN
INTRAMUSCULAR | Status: DC | PRN
  Administered 2015-04-22: 12.5 mg via INTRAVENOUS

## 2015-04-22 SURGICAL SUPPLY — 47 items
BAG DECANTER FOR FLEXI CONT (MISCELLANEOUS) ×2 IMPLANT
BLADE SURG 11 STRL SS (BLADE) ×2 IMPLANT
BLADE SURG 15 STRL LF DISP TIS (BLADE) ×1 IMPLANT
BLADE SURG 15 STRL SS (BLADE) ×1
CHLORAPREP W/TINT 26ML (MISCELLANEOUS) ×2 IMPLANT
COVER MAYO STAND STRL (DRAPES) ×2 IMPLANT
COVER SURGICAL LIGHT HANDLE (MISCELLANEOUS) ×2 IMPLANT
COVER TRANSDUCER ULTRASND GEL (DRAPE) IMPLANT
CRADLE DONUT ADULT HEAD (MISCELLANEOUS) ×2 IMPLANT
DRAPE C-ARM 42X72 X-RAY (DRAPES) ×2 IMPLANT
DRAPE CHEST BREAST 15X10 FENES (DRAPES) ×2 IMPLANT
DRAPE LAPAROSCOPIC ABDOMINAL (DRAPES) IMPLANT
DRAPE UTILITY XL STRL (DRAPES) ×2 IMPLANT
ELECT CAUTERY BLADE 6.4 (BLADE) ×2 IMPLANT
ELECT REM PT RETURN 9FT ADLT (ELECTROSURGICAL) ×2
ELECTRODE REM PT RTRN 9FT ADLT (ELECTROSURGICAL) ×1 IMPLANT
GAUZE SPONGE 4X4 16PLY XRAY LF (GAUZE/BANDAGES/DRESSINGS) ×2 IMPLANT
GEL ULTRASOUND 20GR AQUASONIC (MISCELLANEOUS) ×2 IMPLANT
GLOVE BIO SURGEON STRL SZ8 (GLOVE) ×2 IMPLANT
GLOVE BIOGEL PI IND STRL 8 (GLOVE) ×1 IMPLANT
GLOVE BIOGEL PI INDICATOR 8 (GLOVE) ×1
GOWN STRL REUS W/ TWL LRG LVL3 (GOWN DISPOSABLE) ×2 IMPLANT
GOWN STRL REUS W/ TWL XL LVL3 (GOWN DISPOSABLE) ×1 IMPLANT
GOWN STRL REUS W/TWL LRG LVL3 (GOWN DISPOSABLE) ×2
GOWN STRL REUS W/TWL XL LVL3 (GOWN DISPOSABLE) ×1
INTRODUCER COOK 11FR (CATHETERS) IMPLANT
KIT BASIN OR (CUSTOM PROCEDURE TRAY) ×2 IMPLANT
KIT PORT POWER 8FR ISP CVUE (Catheter) ×2 IMPLANT
KIT ROOM TURNOVER OR (KITS) ×2 IMPLANT
LIQUID BAND (GAUZE/BANDAGES/DRESSINGS) ×2 IMPLANT
NEEDLE HYPO 25GX1X1/2 BEV (NEEDLE) ×2 IMPLANT
NS IRRIG 1000ML POUR BTL (IV SOLUTION) ×2 IMPLANT
PACK SURGICAL SETUP 50X90 (CUSTOM PROCEDURE TRAY) ×2 IMPLANT
PAD ARMBOARD 7.5X6 YLW CONV (MISCELLANEOUS) ×4 IMPLANT
PENCIL BUTTON HOLSTER BLD 10FT (ELECTRODE) ×2 IMPLANT
SET INTRODUCER 12FR PACEMAKER (SHEATH) IMPLANT
SET SHEATH INTRODUCER 10FR (MISCELLANEOUS) IMPLANT
SHEATH COOK PEEL AWAY SET 9F (SHEATH) IMPLANT
SUT MNCRL AB 4-0 PS2 18 (SUTURE) ×2 IMPLANT
SUT PROLENE 2 0 SH 30 (SUTURE) ×2 IMPLANT
SUT VIC AB 3-0 SH 27 (SUTURE)
SUT VIC AB 3-0 SH 27X BRD (SUTURE) IMPLANT
SYR 20ML ECCENTRIC (SYRINGE) ×4 IMPLANT
SYR 5ML LUER SLIP (SYRINGE) ×2 IMPLANT
SYR CONTROL 10ML LL (SYRINGE) ×2 IMPLANT
TOWEL OR 17X24 6PK STRL BLUE (TOWEL DISPOSABLE) ×2 IMPLANT
TOWEL OR 17X26 10 PK STRL BLUE (TOWEL DISPOSABLE) IMPLANT

## 2015-04-22 NOTE — Discharge Instructions (Signed)
Implanted Port Insertion, Care After °Refer to this sheet in the next few weeks. These instructions provide you with information on caring for yourself after your procedure. Your health care provider may also give you more specific instructions. Your treatment has been planned according to current medical practices, but problems sometimes occur. Call your health care provider if you have any problems or questions after your procedure. °WHAT TO EXPECT AFTER THE PROCEDURE °After your procedure, it is typical to have the following:  °· Discomfort at the port insertion site. Ice packs to the area will help. °· Bruising on the skin over the port. This will subside in 3-4 days. °HOME CARE INSTRUCTIONS °· After your port is placed, you will get a manufacturer's information card. The card has information about your port. Keep this card with you at all times.   °· Know what kind of port you have. There are many types of ports available.   °· Wear a medical alert bracelet in case of an emergency. This can help alert health care workers that you have a port.   °· The port can stay in for as long as your health care provider believes it is necessary.   °· A home health care nurse may give medicines and take care of the port.   °· You or a family member can get special training and directions for giving medicine and taking care of the port at home.   °SEEK MEDICAL CARE IF:  °· Your port does not flush or you are unable to get a blood return.   °· You have a fever or chills. °SEEK IMMEDIATE MEDICAL CARE IF: °· You have new fluid or pus coming from your incision.   °· You notice a bad smell coming from your incision site.   °· You have swelling, pain, or more redness at the incision or port site.   °· You have chest pain or shortness of breath. °  °This information is not intended to replace advice given to you by your health care provider. Make sure you discuss any questions you have with your health care provider. °  °Document  Released: 10/11/2012 Document Revised: 12/26/2012 Document Reviewed: 10/11/2012 °Elsevier Interactive Patient Education ©2016 [employer] Inc. ° °

## 2015-04-22 NOTE — Anesthesia Procedure Notes (Signed)
Procedure Name: LMA Insertion Date/Time: 04/22/2015 1:50 PM Performed by: Manuela Schwartz B Pre-anesthesia Checklist: Patient identified Patient Re-evaluated:Patient Re-evaluated prior to inductionOxygen Delivery Method: Circle system utilized Preoxygenation: Pre-oxygenation with 100% oxygen Intubation Type: IV induction LMA: LMA inserted LMA Size: 4.0 Number of attempts: 2 Placement Confirmation: positive ETCO2 and breath sounds checked- equal and bilateral Tube secured with: Tape Dental Injury: Teeth and Oropharynx as per pre-operative assessment

## 2015-04-22 NOTE — Transfer of Care (Signed)
Immediate Anesthesia Transfer of Care Note  Patient: Isabella Cook  Procedure(s) Performed: Procedure(s): INSERTION PORT-A-CATH WITH [place] (Right)  Patient Location: PACU  Anesthesia Type:General  Level of Consciousness: awake, alert  and oriented  Airway & Oxygen Therapy: Patient Spontanous Breathing and Patient connected to nasal cannula oxygen  Post-op Assessment: Report given to RN and Post -op Vital signs reviewed and stable  Post vital signs: Reviewed and stable  Last Vitals:  Filed Vitals:   04/22/15 1219  BP: 155/89  Pulse: 91  Temp: 36.8 C  Resp: 18    Complications: No apparent anesthesia complications

## 2015-04-22 NOTE — Anesthesia Postprocedure Evaluation (Signed)
Anesthesia Post Note  Patient: Isabella Cook  Procedure(s) Performed: Procedure(s) (LRB): INSERTION PORT-A-CATH WITH [place] (Right)  Patient location during evaluation: PACU Anesthesia Type: General Level of consciousness: awake, awake and alert and oriented Pain management: pain level controlled Vital Signs Assessment: post-procedure vital signs reviewed and stable Respiratory status: spontaneous breathing, nonlabored ventilation and respiratory function stable Cardiovascular status: blood pressure returned to baseline Anesthetic complications: no    Last Vitals:  Filed Vitals:   04/22/15 1515 04/22/15 1525  BP: 157/92 149/83  Pulse: 74 71  Temp:    Resp: 16     Last Pain:  Filed Vitals:   04/22/15 1526  PainSc: 0-No pain                 Sulaiman Imbert COKER

## 2015-04-22 NOTE — H&P (View-Only) (Signed)
04/10/2015 1:14 PM Location: [facility] Patient #: [id number] DOB: October 19, 1940 Married / Language: English / Race: White Female  History of Present Illness (Marshell Dilauro A. Earma Nicolaou MD; 04/10/2015 2:14 PM) Patient words: follow up  pt returns after MRI and bx of right breast which shows a 1 cm area of IDC ER /PR positive her 2 neu negative. 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 She has seen oncology and needs chemotherapy. she returns today to discusse options.  She has no complaints.  The patient is a 75 year old female.   Allergies (Ammie Eversole, LPN; [id number] QA348G PM) Gelatin *Assorted Classes** any medication that is gelatin based BEEF  Medication History (Ammie Eversole, LPN; X33443 QA348G PM) Tums 500 (1250MG  Tablet Chewable, Oral) Active. Pepcid (20MG  Tablet, Oral) Active. Claritin (10MG  Tablet, Oral) Active. Menaquinone-7 (100MCG Capsule, Oral) Active. Methylcellulose (Laxative) (475MG  Tablet, Oral) Active. Probiotic Acidophilus (Oral) Active. Vitamin D (Cholecalciferol) (1000UNIT Capsule, Oral) Active. Magnesium Citrate (100MG  Tablet, Oral) Active. Estring (2MG  Ring, Vaginal) Active. Medications Reconciled    Vitals (Ammie Eversole LPN; X33443 075-GRM PM) 04/10/2015 1:14 PM Weight: 175.8 lb Height: 63in Body Surface Area: 1.83 m Body Mass Index: 31.14 kg/m  Temp.: 98.55F(Oral)  Pulse: 70 (Regular)  BP: 124/72 (Sitting, Left Arm, Standard)      Physical Exam (Keisean Skowron A. Coco Sharpnack MD; 04/10/2015 2:15 PM)  General Mental Status-Alert. General Appearance-Consistent with stated age. Hydration-Well hydrated. Voice-Normal.  Eye Eyeball - Bilateral-Extraocular movements intact. Sclera/Conjunctiva - Bilateral-No scleral icterus.  Breast Note: not repeated today  Neurologic Neurologic evaluation reveals -alert and oriented x 3 with no impairment of recent or remote memory. Mental  Status-Normal.  Musculoskeletal Normal Exam - Left-Upper Extremity Strength Normal and Lower Extremity Strength Normal. Normal Exam - Right-Upper Extremity Strength Normal, Lower Extremity Weakness.    Assessment & Plan (Laporsha Grealish A. 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 Unclear how much of the density is DCIS but she has multicentric disease that is triple positive. The right side is ER/PR positive but her 2 neu negative. She has seen Dr [name] and I thing chemotherapy first is best way to go. I told her that she may still nedd a mastectomy but reducing the size may open up lumpectomy for her. She would like a shot of conserving her breasts. Discussed role of reconstructions as well. Will schedule for a port placment. Risk of bleeding infection collapse lung heart injury death DVT catheter migration, catheter malfunction and the need for other procedures or surgery. She agrees to proceed.  Current Plans Pt Education - [facility]CS Portacath HCI Use of a central venous catheter for intravenous therapy was discussed. Technique of catheter placement using ultrasound and fluoroscopy guidance was discussed. Risks such as bleeding, infection, pneumothorax, catheter occlusion, reoperation, and other risks were discussed. I noted a good likelihood this will help address the problem. Questions were answered. The patient expressed understanding & wishes to proceed.

## 2015-04-22 NOTE — Interval H&P Note (Signed)
History and Physical Interval Note:  04/22/2015 1:04 PM  Isabella Cook  has presented today for surgery, with the diagnosis of POOR VENOUS ACCESS  The various methods of treatment have been discussed with the patient and family. After consideration of risks, benefits and other options for treatment, the patient has consented to  Procedure(s): INSERTION PORT-A-CATH WITH [place] (N/A) as a surgical intervention .  The patient's history has been reviewed, patient examined, no change in status, stable for surgery.  I have reviewed the patient's chart and labs.  Questions were answered to the patient's satisfaction.   The procedure has been discussed with the patient.  Alternative therapies have been discussed with the patient.  Operative risks include bleeding,  Infection,  Organ injury,  Nerve injury,  Blood vessel injury,  DVT,  Pulmonary embolism,  Death,  And possible reoperation. COLLAPED LUNG,  NERVE INJURY AND MAJOR VASCULAR INJURY DISCUSSED.   Medical management risks include worsening of present situation.  The success of the procedure is 50 -100 % at treating patients symptoms.  The patient understands and agrees to proceed.  Muhsin Doris A.

## 2015-04-22 NOTE — Op Note (Signed)
reoperative diagnosis: PAC needed  Postoperative diagnosis: Same  Procedure: Portacath Placement with C arm and U/S   Surgeon: Turner Daniels, MD, FACS  Anesthesia: General and 0.25 % marcaine with epinephrine  Clinical History and Indications: The patient is getting ready to begin chemotherapy for her cancer. She  needs a Port-A-Cath for venous access. Risk of bleeding, infection,  Collapse lung,  Death,  DVT,  Organ injury,  Mediastinal injury,  Injury to heart,  Injury to blood vessels,  Nerves,  Migration of catheter,  Embolization of catheter and the need for more surgery.  Description of Procedure: I have seen the patient in the holding area and confirmed the plans for the procedure as noted above. I reviewed the risks and complications again and the patient has no further questions. She wishes to proceed.   The patient was then taken to the operating room. After satisfactory general  anesthesia had been obtained the upper chest and lower neck were prepped and draped as a sterile field. The timeout was done. The patient had multiple allergies and was premedicated with dexamethisone and benedryl.  The right internal jugular vein  was entered under U/S guidance  and the guidewire threaded into the superior vena cava right atrial area under fluoroscopic guidance. An incision was then made on the anterior chest wall and a subcutaneous pocket fashioned for the port reservoir.  The port tubing was then brought through a subcutaneous tunnel from the port site to the guidewire site.  The port and catheter were attached, locked  and flushed. The catheter was measured and cut to appropriate length.The dilator and peel-away sheath were then advanced over the guidewire while monitoring this with fluoroscopy. The guidewire and dilator were removed and the tubing threaded to approximately 21 cm. The peel-away sheath was then removed. The catheter aspirated and flushed easily. Using fluoroscopy the tip  was in the superior vena cava right atrial junction area. It aspirated and flushed easily. That aspirated and flushed easily.  The reservoir was secured to the fascia with 1 sutures of 2-0 Prolene. A final check with fluoroscopy was done to make sure we had no kinks and good positioning of the tip of the catheter. Everything appeared to be okay. The catheter was aspirated, flushed with dilute heparin and then concentrated aqueous heparin.  The incision was then closed with interrupted 3-0 Vicryl, and 4-0 Monocryl subcuticular with Liquid adhesive  on the skin.  There were no operative complications. Estimated blood loss was minimal. All counts were correct. The patient tolerated the procedure well.  Turner Daniels, MD, FACS

## 2015-04-22 NOTE — Anesthesia Preprocedure Evaluation (Addendum)
Anesthesia Evaluation  Patient identified by MRN, date of birth, ID band Patient awake    Reviewed: Allergy & Precautions, NPO status , Patient's Chart, lab work & pertinent test results  History of Anesthesia Complications Negative for: history of anesthetic complications  Airway Mallampati: III  TM Distance: <3 FB Neck ROM: Full    Dental  (+) Teeth Intact,    Pulmonary neg pulmonary ROS,    breath sounds clear to auscultation       Cardiovascular negative cardio ROS   Rhythm:Regular     Neuro/Psych negative neurological ROS  negative psych ROS   GI/Hepatic Neg liver ROS, hiatal hernia, GERD  Medicated and Controlled,  Endo/Other  negative endocrine ROS  Renal/GU negative Renal ROS     Musculoskeletal  (+) Arthritis ,   Abdominal   Peds  Hematology negative hematology ROS (+)   Anesthesia Other Findings   Reproductive/Obstetrics                             Anesthesia Physical Anesthesia Plan  ASA: II  Anesthesia Plan: General   Post-op Pain Management:    Induction: Intravenous  Airway Management Planned: LMA  Additional Equipment: None  Intra-op Plan:   Post-operative Plan: Extubation in OR  Informed Consent: I have reviewed the patients History and Physical, chart, labs and discussed the procedure including the risks, benefits and alternatives for the proposed anesthesia with the patient or authorized representative who has indicated his/her understanding and acceptance.   Dental advisory given  Plan Discussed with: CRNA and Surgeon  Anesthesia Plan Comments:         Anesthesia Quick Evaluation

## 2015-04-23 ENCOUNTER — Encounter (HOSPITAL_COMMUNITY): Payer: Self-pay | Admitting: Surgery

## 2015-04-24 ENCOUNTER — Encounter: Payer: Self-pay | Admitting: *Deleted

## 2015-04-24 ENCOUNTER — Other Ambulatory Visit: Payer: Medicare Other

## 2015-04-25 ENCOUNTER — Ambulatory Visit (HOSPITAL_COMMUNITY)
Admission: RE | Admit: 2015-04-25 | Discharge: 2015-04-25 | Disposition: A | Discharge status: Home / Self Care | Payer: Medicare Other | Source: Ambulatory Visit | Attending: Family Medicine | Admitting: Family Medicine

## 2015-04-25 ENCOUNTER — Ambulatory Visit (HOSPITAL_BASED_OUTPATIENT_CLINIC_OR_DEPARTMENT_OTHER)
Admission: RE | Admit: 2015-04-25 | Discharge: 2015-04-25 | Disposition: A | Discharge status: Home / Self Care | Payer: Medicare Other | Source: Ambulatory Visit | Attending: Internal Medicine | Admitting: Internal Medicine

## 2015-04-25 VITALS — BP 144/82 | HR 70 | Wt 175.8 lb

## 2015-04-25 DIAGNOSIS — K219 Gastro-esophageal reflux disease without esophagitis: Secondary | ICD-10-CM | POA: Diagnosis not present

## 2015-04-25 DIAGNOSIS — I517 Cardiomegaly: Secondary | ICD-10-CM | POA: Insufficient documentation

## 2015-04-25 DIAGNOSIS — Z170 Estrogen receptor positive status [ER+]: Secondary | ICD-10-CM | POA: Insufficient documentation

## 2015-04-25 DIAGNOSIS — Z79899 Other long term (current) drug therapy: Secondary | ICD-10-CM | POA: Diagnosis not present

## 2015-04-25 DIAGNOSIS — I340 Nonrheumatic mitral (valve) insufficiency: Secondary | ICD-10-CM | POA: Diagnosis not present

## 2015-04-25 DIAGNOSIS — C50412 Malignant neoplasm of upper-outer quadrant of left female breast: Secondary | ICD-10-CM | POA: Diagnosis present

## 2015-04-25 DIAGNOSIS — C50511 Malignant neoplasm of lower-outer quadrant of right female breast: Secondary | ICD-10-CM | POA: Diagnosis not present

## 2015-04-25 NOTE — Progress Notes (Signed)
  Echocardiogram 2D Echocardiogram has been performed.  Johny Chess 04/25/2015, 9:50 AM

## 2015-04-25 NOTE — Progress Notes (Signed)
Advanced Heart Failure Medication Review by a Pharmacist  Does the patient  feel that his/her medications are working for him/her?  yes  Has the patient been experiencing any side effects to the medications prescribed?  no  Does the patient measure his/her own blood pressure or blood glucose at home?  no   Does the patient have any problems obtaining medications due to transportation or finances?   no  Understanding of regimen: excellent Understanding of indications: excellent Potential of compliance: excellent Patient understands to avoid NSAIDs. Patient understands to avoid decongestants.  Issues to address at subsequent visits: none   Pharmacist comments: Isabella Cook is a 75 yo woman with new breast CA pt referral to HF clinic. Currently not on any chronic prescription medications and has no medication issues to discuss at this time. Her only concern is potential drug interaction between lorazepam and Percocet. It was explained that the two medications are known to be sedating and concomitant use of the two could increase its sedating effects. Counseled pt to avoid taking both medications at same time if Percocet is needed for pain and to space out medications by several hours.   Isabella Cook, [name].D., BCPS PGY2 Cardiology Pharmacy Resident Pager: [phone]   Time with patient: 5 min Preparation and documentation time: 5 min Total time: 10 min

## 2015-04-25 NOTE — Progress Notes (Signed)
Patient ID: Isabella Cook, female   DOB: 11/23/1940, 75 y.o.   MRN: [id number] Oncology: Dr. [name]  75 yo with new diagnosis of breast cancer presents for cardio-oncology evaluation. She has bilateral cancer, diagnosed 2/17.  ER+/PR+/HER2+.  Start chemotherapy on Tuesday.  No prior cardiac history.  No exertional dyspnea or chest pain.  Overall feeling ok.   PMH: 1. GERD 2. Breast cancer: Bilateral.  Diagnosed 2/17.  ER+/PR+/HER2+.  Plan for weekly paclitaxel + Herceptin x 12 wks then Herceptin every 3 weeks x 9 more months.  Surgery after chemo.  - Echo (4/17) with EF 55-60%, mild LVH, normal RV size and systolic function, lateral s' 11.5 cm/sec, GLS -19.1%.   Social History   Social History  . Marital Status: Married    Spouse Name: N/A  . Number of Children: N/A  . Years of Education: N/A   Occupational History  . Not on file.   Social History Main Topics  . Smoking status: Never Smoker   . Smokeless tobacco: Not on file  . Alcohol Use: Yes     Comment: occ 1 every 2-3 months  . Drug Use: No  . Sexual Activity: Not on file   Other Topics Concern  . Not on file   Social History Narrative   FH: Mother with CHF, uncertain cause.   ROS: All systems reviewed and negative except as per HPI.   Current Outpatient Prescriptions  Medication Sig Dispense Refill  . calcium carbonate (TUMS - DOSED IN MG ELEMENTAL CALCIUM) 500 MG chewable tablet Chew 500 mg by mouth daily as needed for indigestion or heartburn.     . famotidine (PEPCID) 20 MG tablet Take 20 mg by mouth daily.     Marland Kitchen lidocaine-prilocaine (EMLA) cream Apply to affected area once 30 g 3  . loratadine (CLARITIN) 10 MG tablet Take 10 mg by mouth daily.    Marland Kitchen LORazepam (ATIVAN) 0.5 MG tablet Take 1 tablet (0.5 mg total) by mouth at bedtime as needed (Nausea or vomiting). 15 tablet 0  . Magnesium Citrate 100 MG TABS Take 200 mg by mouth daily.    . Methylcellulose, Laxative, (CITRUCEL) 500 MG TABS Take 2,000 mg by mouth 2  (two) times daily.    Marland Kitchen oxyCODONE-acetaminophen (PERCOCET/ROXICET) 5-325 MG tablet Take 1 tablet by mouth every 4 (four) hours as needed for moderate pain. 30 tablet 0  . Polyethyl Glycol-Propyl Glycol (SYSTANE ULTRA) 0.4-0.3 % SOLN Place 1 drop into both eyes daily as needed (dry eyes).    . Probiotic Product (ALIGN PO) Take 1 tablet by mouth daily.     Marland Kitchen VITAMIN D-VITAMIN K PO Take 1 capsule by mouth daily. Reported on 04/15/2015    . ondansetron (ZOFRAN) 8 MG tablet Take 1 tablet (8 mg total) by mouth 2 (two) times daily as needed (Nausea or vomiting). 30 tablet 1  . prochlorperazine (COMPAZINE) 10 MG tablet Take 1 tablet (10 mg total) by mouth every 6 (six) hours as needed (Nausea or vomiting). 30 tablet 1   No current facility-administered medications for this encounter.   BP 144/82 mmHg  Pulse 70  Wt 175 lb 12.8 oz (79.742 kg)  SpO2 96% General: NAD Neck: No JVD, no thyromegaly or thyroid nodule.  Lungs: Clear to auscultation bilaterally with normal respiratory effort. CV: Nondisplaced PMI.  Heart regular S1/S2, no S3/S4, 1/6 SEM RUSB.  No peripheral edema.  No carotid bruit.  Normal pedal pulses.  Abdomen: Soft, nontender, no hepatosplenomegaly, no distention.  Skin: Intact  without lesions or rashes.  Neurologic: Alert and oriented x 3.  Psych: Normal affect. Extremities: No clubbing or cyanosis.  HEENT: Normal.   Assessment/plan: 75 yo with history of bilateral breast cancer, ER+/PR+/HER2+.  She will start chemotherapy including Herceptin on Monday.  We discussed the cardiac risk of Herceptin today.  I reviewed her baseline echo, EF and strain parameters look normal.  She will start Herceptin Tuesday and I will have her returns for echo and office visit in 3 months.   Loralie Champagne 04/25/2015 10:43 AM

## 2015-04-25 NOTE — Patient Instructions (Signed)
Come back in 3 months with appointment and echocardiogram.  We will contact you closer to that time to schedule

## 2015-04-28 NOTE — Progress Notes (Signed)
Isabella Cook  [phone] [phone]) [phone]     ID: Isabella Cook DOB: Jul 23, 1940  MR#: [id number]  YTK#:[id number]  Patient Care Team: [name], FNP as PCP - General (Family Medicine) [name], MD as Consulting Physician (Oncology) [name], MD as Consulting Physician (General Surgery) [name], NP as Nurse Practitioner (Hematology and Oncology) [name], MD as Consulting Physician (Oncology) Thea Silversmith, MD as Consulting Physician (Radiation Oncology) [name], MD as Consulting Physician (Neurosurgery) [name], MD as Referring Physician (Otolaryngology) [name], MD as Consulting Physician (Cardiology) PCP: [name], FNP OTHER MD:  CHIEF COMPLAINT: HER-2 positive breast cancer, bilateral beast cancers  CURRENT TREATMENT: Paclitaxel, trastuzumab   BREAST CANCER HISTORY:   From the original intake note:  Isabella Cook had routine screening mammography at [facility] suggesting an area of indeterminate microcalcifications in the upper outer quadrant of the left breast measuring 8 mm. She was referred for left breast stereotactic biopsy performed at the [facility] 02/27/2015. This showed ([id number]) invasive ductal carcinoma, grade 2, estrogen receptor 100% positive, progesterone receptor 100% positive, both with strong staining intensity, with an MIB-1 of 20%, and HER-2 amplified with a signals ratio of 2.53, the number per cell being 2.65. 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  On 03/25/2015 the patient underwent bilateral breast MRI, which showed the breast density to be category B. this showed, in the left breast, an area of non-masslike enhancement measuring 7.8 cm. Associated with this were 2 adjacent irregular masses measuring 1.7 and 1.4 cm. The masses were 5 cm anterior and superior to the biopsy clip. There was a cortically thickened left axillary lymph node  noted.  Also in the right breast there was a 7 mm irregular enhancing mass at the 6:30 o'clock position.  Her subsequent history is as detailed below    INTERVAL HISTORY: Isabella Cook returns today to start treatment of her estrogen receptor positive bilateral breast cancers accompanied by her husband [name].. Today is day 1 cycle 1 of 12 planned weekly doses of paclitaxel and trastuzumab.  Since her last visit here,  on 04/25/2015 Kolleen had an echocardiogram which showed an ejection fraction in the 55-60% range. There was grade 1 diastolic dysfunction. She also underwent port placement 04/22/2015.  She had no significant problems with that and has not taken any of the oxycodone she was given for pain control.  REVIEW OF SYSTEMS: Isabella Cook  Is having some knee pains. She has a history of erythema migraines remotely. She did see  A doctor at [facility] who told her she did not have Lyme disease but that to make a definitive diagnosis one way or the other would be $1000 so that test was never sent. Of course knee pain is very, and just due to arthritis but [name] feels this is her Lyme disease acting up. She is planning to go back to that physician at [facility] ( I cannot locate her name ). There has been no fever  And no other systemic symptoms. She does have a little bit of swelling in the left calf that she wanted me to look at. That area feels tight to her. A detailed review of systems today was otherwise negative  PAST MEDICAL HISTORY: Past Medical History  Diagnosis Date  . GERD (gastroesophageal reflux disease)   . Arthritis     wrists and knees  . Cancer ([place]) 03-2015    left breast  . Multiple food allergies   .  Urinary tract infection   . History of hiatal hernia   . History of jaundice as a child     PAST SURGICAL HISTORY: Past Surgical History  Procedure Laterality Date  . Abdominal hysterectomy    . Cholecystectomy    . Appendectomy    . Tubal ligation    . Back surgery      lumbar  . Hernia repair       UHR  . Cervical disc surgery  2016  . Portacath placement Right 04/22/2015    Procedure: INSERTION PORT-A-CATH WITH [place];  Surgeon: [name], MD;  Location: [facility];  Service: General;  Laterality: Right;    FAMILY HISTORY No family history on file. The patient's father had a history of pseudo-bulbar pulse 44 and died from a stroke at age 42. The patient's mother died at age 41 from complications of emphysema. The patient had no brothers, 2 sisters. There is no history of cancer in the family and specifically no history of breast or ovarian cancer.  GYNECOLOGIC HISTORY:  No LMP recorded. Patient has had a hysterectomy. Menarche age 75, first live birth age 75. She is GX P2. She underwent a hysterectomy with left salpingo-oophorectomy in 1984. She received hormone replacement for approximately 15 years, until 2005.  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 Her husband [name] is a retired Social research officer, government. Daughter Isabella Cook lives in [place] where she is a housewife, homeschooling her children. Son Isabella Cook lives in [place] and works in Chartered certified accountant. The patient has 2 grandchildren. She is not a church attender    ADVANCED DIRECTIVES: In place   HEALTH MAINTENANCE: Social History  Substance Use Topics  . Smoking status: Never Smoker   . Smokeless tobacco: Not on file  . Alcohol Use: Yes     Comment: occ 1 every 2-3 months     Colonoscopy:2014  PAP:  Bone density: 2017 [facility]//osteopenia   Lipid panel:  Allergies  Allergen Reactions  . Dairy Aid [Lactase] Anaphylaxis     Takes claritin every day and benadryl, if needed, to prevent anaphylaxis   . Eggs Or Egg-Derived Products Anaphylaxis    Nasal stuffiness, Takes claritin every day and benadryl, if needed, to prevent anaphylaxis  . Gelatin (Bovine) [Beef Extract] Anaphylaxis    Muscle pain,  Takes claritin every  day and benadryl, if needed, to prevent anaphylaxis   . Gluten Meal Anaphylaxis and Diarrhea    Takes claritin every day and benadryl, if needed, to prevent anaphylaxis   . Lambs Quarters Anaphylaxis    ALL "mammal" MEAT per pt -- Muscle pain  Can eat chicken, fish, [place]  . Milk-Related Compounds Anaphylaxis    Nasal stuffiness Cheese (mozzarella, swiss, cheddar, cottage)  . Pork Allergy Anaphylaxis    ALL "mammal" MEAT per pt -- Muscle pain  Can eat chicken, fish, [place]  . Wheat Bran Anaphylaxis and Diarrhea     Takes claritin every day and benadryl, if needed, to prevent anaphylaxis   . Whey Anaphylaxis and Diarrhea     Takes claritin every day and benadryl, if needed, to prevent anaphylaxis   . Yeast Anaphylaxis     Takes claritin every day and benadryl, if needed, to prevent anaphylaxis  . Soy Allergy Diarrhea  . Almond Oil Nausea And Vomiting    Current Outpatient Prescriptions  Medication Sig Dispense Refill  . diphenhydrAMINE (SOMINEX) 25 MG tablet Take  25 mg by mouth at bedtime as needed.    . calcium carbonate (TUMS - DOSED IN MG ELEMENTAL CALCIUM) 500 MG chewable tablet Chew 500 mg by mouth daily as needed for indigestion or heartburn.     . famotidine (PEPCID) 20 MG tablet Take 20 mg by mouth daily.     Marland Kitchen lidocaine-prilocaine (EMLA) cream Apply to affected area once 30 g 3  . loratadine (CLARITIN) 10 MG tablet Take 10 mg by mouth daily.    Marland Kitchen LORazepam (ATIVAN) 0.5 MG tablet Take 1 tablet (0.5 mg total) by mouth at bedtime as needed (Nausea or vomiting). 15 tablet 0  . Magnesium Citrate 100 MG TABS Take 200 mg by mouth daily.    . Methylcellulose, Laxative, (CITRUCEL) 500 MG TABS Take 2,000 mg by mouth 2 (two) times daily.    . ondansetron (ZOFRAN) 8 MG tablet Take 1 tablet (8 mg total) by mouth 2 (two) times daily as needed (Nausea or vomiting). 30 tablet 1  . Polyethyl Glycol-Propyl Glycol (SYSTANE ULTRA) 0.4-0.3 % SOLN Place 1 drop into both eyes daily as needed  (dry eyes).    . Probiotic Product (ALIGN PO) Take 1 tablet by mouth daily.     . prochlorperazine (COMPAZINE) 10 MG tablet Take 1 tablet (10 mg total) by mouth every 6 (six) hours as needed (Nausea or vomiting). 30 tablet 1  . VITAMIN D-VITAMIN K PO Take 1 capsule by mouth daily. Reported on 04/15/2015     No current facility-administered medications for this visit.    OBJECTIVE: Middle-aged white woman  Who appears stated age 75 Vitals:   04/29/15 0835  BP: 146/78  Pulse: 90  Temp: 98.5 F (36.9 C)  Resp: 18     Body mass index is 30.28 kg/(m^2).    ECOG FS:1 - Symptomatic but completely ambulatory  Sclerae unicteric, EOMs intact Oropharynx clear, dentition in good repair No cervical or supraclavicular adenopathy Lungs no rales or rhonchi Heart regular rate and rhythm Abd soft, nontender, positive bowel sounds MSK no focal spinal tenderness, no upper extremity lymphedema; there is absolutely no ankle edema but the left calf does appear slightly larger than the right, without tenderness or erythema Neuro: nonfocal, well oriented, appropriate affect Breasts:  deferred   LAB RESULTS:  CMP     Component Value Date/Time   NA 142 04/18/2015 0837   K 4.4 04/18/2015 0837   CO2 27 04/18/2015 0837   GLUCOSE 90 04/18/2015 0837   BUN 9.3 04/18/2015 0837   CREATININE 1.1 04/18/2015 0837   CALCIUM 10.4 04/18/2015 0837   PROT 7.2 04/18/2015 0837   ALBUMIN 3.8 04/18/2015 0837   AST 16 04/18/2015 0837   ALT 11 04/18/2015 0837   ALKPHOS 55 04/18/2015 0837   BILITOT 0.47 04/18/2015 0837    INo results found for: SPEP, UPEP  Lab Results  Component Value Date   WBC 5.2 04/18/2015   NEUTROABS 2.8 04/18/2015   HGB 13.4 04/18/2015   HCT 40.2 04/18/2015   MCV 86.9 04/18/2015   PLT 210 04/18/2015      Chemistry      Component Value Date/Time   NA 142 04/18/2015 0837   K 4.4 04/18/2015 0837   CO2 27 04/18/2015 0837   BUN 9.3 04/18/2015 0837   CREATININE 1.1 04/18/2015  0837      Component Value Date/Time   CALCIUM 10.4 04/18/2015 0837   ALKPHOS 55 04/18/2015 0837   AST 16 04/18/2015 0837   ALT 11 04/18/2015 0837  BILITOT 0.47 04/18/2015 0837       No results found for: LABCA2  No components found for: HYIFO277  No results for input(s): INR in the last 168 hours.  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 Pre-treatment MRI demonstrated bilateral breast masses and a possible solitary left axillary node with cortical thickening. 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 Second-look ultrasound of the left axilla has been requested to see if an abnormal lymph node is present and if so, biopsy is requested. EXAM: ULTRASOUND OF THE LEFT AXILLA COMPARISON:  Diagnostic bilateral breast MRI 03/25/2015. Multiple recent prior mammograms and left breast ultrasound 02/18/2015 from [facility]. No prior left axillary ultrasound. FINDINGS: On physical exam, there is no palpable lymphadenopathy in the left axilla. Ultrasound is performed, showing several normal-appearing lymph nodes in the left axilla, including a lymph node measures approximately 2.6 cm in length. One of the lymph nodes has cortical thickening up to 2 mm, within normal limits. No pathologic left axillary lymph nodes are visible by ultrasound. IMPRESSION: No pathologic left  axillary lymphadenopathy by ultrasound. RECOMMENDATION: Treatment plan for the known bilateral breast cancers. I have discussed the findings and recommendations with the patient. Results were also provided in writing at the conclusion of the visit. BI-RADS CATEGORY  6: Known biopsy-proven malignancy. Electronically Signed   By: Evangeline Dakin M.D.   On: 04/04/2015 14:53   Dg Chest Port 1 View  04/22/2015  CLINICAL DATA:  Status post right Port-A-Cath placement today. Initial encounter. EXAM: PORTABLE CHEST 1 VIEW COMPARISON:  None. FINDINGS: Right IJ approach Port-A-Cath is in place with the tip projecting at the mid to lower superior vena cava. No pneumothorax is identified. Mild left basilar atelectasis is seen. The lungs are otherwise clear. Heart size is normal. No focal bony abnormality. IMPRESSION: Tip of Port-A-Cath projects at the mid to lower superior vena cava. Exact position can be confirmed with a lateral view. Negative for pneumothorax. Electronically Signed   By: Inge Rise M.D.   On: 04/22/2015 15:03   Mm Digital Diagnostic Bilat  04/02/2015  CLINICAL DATA:  Status post MRI guided core biopsies bilaterally. EXAM: DIAGNOSTIC BILATERAL MAMMOGRAM POST MRI BIOPSY COMPARISON:  Previous exam(s). FINDINGS: Mammographic images were obtained following MRI guided biopsy of area of enhancement within the 630 o'clock location of the right breast. A cylinder-shaped clip is identified in the lower outer quadrant of the right breast, in expected location. Following biopsy of an enhancing mass the upper-outer quadrant, anterior left breast, a bow tie shaped clip is identified in the anterior upper outer quadrant as expected. This clip is approximately 6.3 cm anterior to the clip placed the time of recent diagnosis malignancy. IMPRESSION: Tissue marker clips are in expected locations bilaterally. Final Assessment: Post Procedure Mammograms for Marker Placement Electronically Signed   By: Nolon Nations  M.D.   On: 04/02/2015 10:01   Dg Fluoro Guide Cv Line-no Report  04/22/2015  CLINICAL DATA:  FLOURO GUIDE  CV LINE Fluoroscopy was utilized by the requesting physician.  No radiographic interpretation.   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 GRADE II INVASIVE MAMMARY CARCINOMA AND MAMMARY CARCINOMA IN SITU of the lower outer Right breast. This was found to be concordant by Dr. Nolon Nations. Pathology results were discussed with the patient by telephone. The patient reported doing well after the biopsies with tenderness at the sites. Post biopsy instructions and care were reviewed and questions were answered. The patient was encouraged to call [facility] for any additional concerns. The patient has a recent diagnosis of left breast cancer and should follow her outlined treatment plan. She is scheduled for a Left axillary node ultrasound guided biopsy on April 04, 2015. Pathology results were also called to Dr. [name] Cornett's office. Pathology results reported by Terie Purser, RN on 04/04/2015. Electronically Signed   By: Nolon Nations M.D.   On: 04/04/2015 09:05  04/07/2015  CLINICAL DATA:  Patient presents for MR guided core biopsies of lesions bilaterally. Recently diagnosed grade 2 invasive ductal carcinoma with associated calcifications diagnosed by stereotactic guided core biopsy of calcifications in the posterior upper outer quadrant of the left breast. EXAM: MR GUIDED CORE NEEDLE BIOPSY OF THE LEFT BREAST MRI GUIDED CORE NEEDLE BIOPSY OF THE RIGHT BREAST TECHNIQUE: Multiplanar, multisequence MR imaging of bilateral breasts was performed both before and after administration of intravenous contrast. CONTRAST:  33m MULTIHANCE GADOBENATE DIMEGLUMINE 529 MG/ML IV SOLN  COMPARISON:  Previous exams. FINDINGS: I met with the patient, and we discussed the procedure of MRI guided biopsy, including risks, benefits, and alternatives. Specifically, we discussed the risks of infection, bleeding, tissue injury, clip migration, and inadequate sampling. Informed, written consent was given. The usual time out protocol was performed immediately prior to the procedure. SITE 1, LEFT BREAST, BOW TIE SHAPED CLIP: Using sterile technique, 1% Lidocaine, MRI guidance, and a 9 gauge vacuum assisted device, biopsy was performed of enhancing mass within the anterior upper outer quadrant of the left breast using a lateral approach. At the conclusion of the procedure, a bow tie shaped tissue marker clip was deployed into the biopsy cavity. SITE 2, RIGHT BREAST, CYLINDER-SHAPED CLIP: Using the same technique, 1% lidocaine, MR guidance, and a 9 gauge vacuum assisted device, biopsy was performed of a small enhancing mass within the lower outer quadrant of the right breast using a lateral approach. At the conclusion of the procedure a cylinder-shaped clip was deployed at the biopsy site. Follow-up 2-view mammogram was performed and dictated separately. IMPRESSION: MRI guided biopsy of single lesion in each breast. No apparent complications. Electronically Signed: By: ENolon NationsM.D. On: 04/02/2015 10:15   Mr Rt Breast Bx WJohnella MoloneyDev 1st Lesion Image Bx Spec Mr Guide  04/07/2015  ADDENDUM REPORT: 04/04/2015 09:05 ADDENDUM: Pathology revealed ATYPICAL DUCTAL HYPERPLASIA of the upper outer anterior Left breast which borders on DUCTAL CARCINOMA IN SITU. GRADE II INVASIVE MAMMARY CARCINOMA AND MAMMARY CARCINOMA IN SITU of the lower outer Right breast. This was found to be concordant by Dr. ENolon Nations Pathology results were discussed with the patient by telephone. The patient reported doing well after the biopsies with tenderness at the sites. Post biopsy instructions and care were reviewed and questions  were answered. The patient was encouraged  to call [facility] for any additional concerns. The patient has a recent diagnosis of left breast cancer and should follow her outlined treatment plan. She is scheduled for a Left axillary node ultrasound guided biopsy on April 04, 2015. Pathology results were also called to Dr. [name] Cornett's office. Pathology results reported by Terie Purser, RN on 04/04/2015. Electronically Signed   By: Nolon Nations M.D.   On: 04/04/2015 09:05  04/07/2015  CLINICAL DATA:  Patient presents for MR guided core biopsies of lesions bilaterally. Recently diagnosed grade 2 invasive ductal carcinoma with associated calcifications diagnosed by stereotactic guided core biopsy of calcifications in the posterior upper outer quadrant of the left breast. EXAM: MR GUIDED CORE NEEDLE BIOPSY OF THE LEFT BREAST MRI GUIDED CORE NEEDLE BIOPSY OF THE RIGHT BREAST TECHNIQUE: Multiplanar, multisequence MR imaging of bilateral breasts was performed both before and after administration of intravenous contrast. CONTRAST:  4m MULTIHANCE GADOBENATE DIMEGLUMINE 529 MG/ML IV SOLN COMPARISON:  Previous exams. FINDINGS: I met with the patient, and we discussed the procedure of MRI guided biopsy, including risks, benefits, and alternatives. Specifically, we discussed the risks of infection, bleeding, tissue injury, clip migration, and inadequate sampling. Informed, written consent was given. The usual time out protocol was performed immediately prior to the procedure. SITE 1, LEFT BREAST, BOW TIE SHAPED CLIP: Using sterile technique, 1% Lidocaine, MRI guidance, and a 9 gauge vacuum assisted device, biopsy was performed of enhancing mass within the anterior upper outer quadrant of the left breast using a lateral approach. At the conclusion of the procedure, a bow tie shaped tissue marker clip was deployed into the biopsy cavity. SITE 2, RIGHT BREAST, CYLINDER-SHAPED CLIP: Using the same  technique, 1% lidocaine, MR guidance, and a 9 gauge vacuum assisted device, biopsy was performed of a small enhancing mass within the lower outer quadrant of the right breast using a lateral approach. At the conclusion of the procedure a cylinder-shaped clip was deployed at the biopsy site. Follow-up 2-view mammogram was performed and dictated separately. IMPRESSION: MRI guided biopsy of single lesion in each breast. No apparent complications. Electronically Signed: By: ENolon NationsM.D. On: 04/02/2015 10:15    ASSESSMENT: 75[facility]  status post left breast biopsy 02/27/2015 for a clinical mT1-3 N1 invasive ductal carcinoma, grade 2, strongly estrogen and progesterone receptor positive, HER-2 amplified, with an MIB-1 of 20%.   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  (b) right breast lower outer quadrant: a clinical T1b N0, stage IA invasive ductal carcinoma, estrogen and progesterone receptor positive, HER-2 not amplified, with an Mib-1 of 4%  (2)  neoadjuvant chemotherapy will consist of weekly paclitaxel 12 together with weekly trastuzumab 12, starting 04/29/2015  (3) trastuzumab will be continued every 3 weeks for an additional 9 months after completion of chemotherapy  (4) definitive surgery to follow chemotherapy  (5) adjuvant radiation therapy to follow surgery  (6) tamoxifen to start at the completion of local treatment  (a) the patient has been asked to discontinue Estring as of 05/05/2015, to be resumed when tamoxifen is started   PLAN:  JMaryclareis  ready to start her chemotherapy today. She has  a good understanding of the possible toxicities, side effects and complications. Her port appears intact.  We reviewed how to take her nausea medicine and I suggested she take ondansetron this evening and tomorrow morning. If she has any nausea in addition she may take the Compazine. She will find out whether she gets a headache from the ondansetron or sleepiness from the Compazine. She will use the Ativan as necessary although she prefers to use Benadryl at bedtime.  I am going to set her up for a Doppler ultrasonography this afternoon just to make sure we don't have to worry about the swelling of the left calf.  Otherwise she is going to return to see me again next week. If everything goes well we will see her with as many treatments this week and schedule of  Her 12 planned paclitaxel/trastuzumab treatments. She knows to call for any other problems that may develop before her next visit here.  [name], MD   04/29/2015 8:53 AM Medical Oncology and Hematology [facility] [address] [place], [place] [address] Tel. [phone]    Fax. [phone]

## 2015-04-29 ENCOUNTER — Ambulatory Visit (HOSPITAL_COMMUNITY)
Admission: RE | Admit: 2015-04-29 | Discharge: 2015-04-29 | Disposition: A | Discharge status: Home / Self Care | Payer: Medicare Other | Source: Ambulatory Visit | Attending: Oncology | Admitting: Oncology

## 2015-04-29 ENCOUNTER — Other Ambulatory Visit: Payer: Self-pay | Admitting: *Deleted

## 2015-04-29 ENCOUNTER — Ambulatory Visit (HOSPITAL_BASED_OUTPATIENT_CLINIC_OR_DEPARTMENT_OTHER): Payer: Medicare Other | Admitting: Oncology

## 2015-04-29 ENCOUNTER — Encounter: Payer: Self-pay | Admitting: *Deleted

## 2015-04-29 ENCOUNTER — Other Ambulatory Visit: Payer: Medicare Other

## 2015-04-29 ENCOUNTER — Ambulatory Visit (HOSPITAL_BASED_OUTPATIENT_CLINIC_OR_DEPARTMENT_OTHER): Payer: Medicare Other

## 2015-04-29 VITALS — BP 146/78 | HR 90 | Temp 98.5°F | Resp 18 | Ht 63.5 in | Wt 173.7 lb

## 2015-04-29 VITALS — BP 130/74 | HR 73 | Temp 98.2°F | Resp 18

## 2015-04-29 DIAGNOSIS — Z5111 Encounter for antineoplastic chemotherapy: Secondary | ICD-10-CM

## 2015-04-29 DIAGNOSIS — C50412 Malignant neoplasm of upper-outer quadrant of left female breast: Secondary | ICD-10-CM | POA: Diagnosis present

## 2015-04-29 DIAGNOSIS — C50511 Malignant neoplasm of lower-outer quadrant of right female breast: Secondary | ICD-10-CM

## 2015-04-29 DIAGNOSIS — M7989 Other specified soft tissue disorders: Secondary | ICD-10-CM | POA: Insufficient documentation

## 2015-04-29 DIAGNOSIS — M8580 Other specified disorders of bone density and structure, unspecified site: Secondary | ICD-10-CM | POA: Diagnosis not present

## 2015-04-29 DIAGNOSIS — M25569 Pain in unspecified knee: Secondary | ICD-10-CM

## 2015-04-29 DIAGNOSIS — Z5112 Encounter for antineoplastic immunotherapy: Secondary | ICD-10-CM

## 2015-04-29 MED ORDER — DIPHENHYDRAMINE HCL 50 MG/ML IJ SOLN
INTRAMUSCULAR | Status: AC
  Filled 2015-04-29: qty 1

## 2015-04-29 MED ORDER — DIPHENHYDRAMINE HCL 50 MG/ML IJ SOLN
25.0000 mg | Freq: Once | INTRAMUSCULAR | Status: AC
  Administered 2015-04-29: 25 mg via INTRAVENOUS

## 2015-04-29 MED ORDER — ACETAMINOPHEN 325 MG PO TABS
650.0000 mg | ORAL_TABLET | Freq: Once | ORAL | Status: AC
  Administered 2015-04-29: 650 mg via ORAL

## 2015-04-29 MED ORDER — HEPARIN SOD (PORK) LOCK FLUSH 100 UNIT/ML IV SOLN
500.0000 [IU] | Freq: Once | INTRAVENOUS | Status: AC | PRN
  Administered 2015-04-29: 500 [IU]
  Filled 2015-04-29: qty 5

## 2015-04-29 MED ORDER — SODIUM CHLORIDE 0.9% FLUSH
10.0000 mL | INTRAVENOUS | Status: DC | PRN
  Administered 2015-04-29: 10 mL
  Filled 2015-04-29: qty 10

## 2015-04-29 MED ORDER — SODIUM CHLORIDE 0.9 % IV SOLN
4.0000 mg | Freq: Once | INTRAVENOUS | Status: AC
  Administered 2015-04-29: 4 mg via INTRAVENOUS
  Filled 2015-04-29: qty 0.4

## 2015-04-29 MED ORDER — SODIUM CHLORIDE 0.9 % IV SOLN
80.0000 mg/m2 | Freq: Once | INTRAVENOUS | Status: AC
  Administered 2015-04-29: 150 mg via INTRAVENOUS
  Filled 2015-04-29: qty 25

## 2015-04-29 MED ORDER — SODIUM CHLORIDE 0.9 % IV SOLN
Freq: Once | INTRAVENOUS | Status: AC
  Administered 2015-04-29: 09:00:00 via INTRAVENOUS

## 2015-04-29 MED ORDER — FAMOTIDINE IN NACL 20-0.9 MG/50ML-% IV SOLN
INTRAVENOUS | Status: AC
  Filled 2015-04-29: qty 50

## 2015-04-29 MED ORDER — ACETAMINOPHEN 325 MG PO TABS
ORAL_TABLET | ORAL | Status: AC
  Filled 2015-04-29: qty 2

## 2015-04-29 MED ORDER — TRASTUZUMAB CHEMO INJECTION 440 MG
4.0000 mg/kg | Freq: Once | INTRAVENOUS | Status: AC
  Administered 2015-04-29: 315 mg via INTRAVENOUS
  Filled 2015-04-29: qty 15

## 2015-04-29 MED ORDER — FAMOTIDINE IN NACL 20-0.9 MG/50ML-% IV SOLN
20.0000 mg | Freq: Once | INTRAVENOUS | Status: AC
  Administered 2015-04-29: 20 mg via INTRAVENOUS

## 2015-04-29 NOTE — Patient Instructions (Signed)
[facility] Discharge Instructions for Patients Receiving Chemotherapy  Today you received the following chemotherapy agents taxol, herceptin  To help prevent nausea and vomiting after your treatment, we encourage you to take your nausea medication as directed by Dr [name]   If you develop nausea and vomiting that is not controlled by your nausea medication, call the clinic.   BELOW ARE SYMPTOMS THAT SHOULD BE REPORTED IMMEDIATELY:  *FEVER GREATER THAN 100.5 F  *CHILLS WITH OR WITHOUT FEVER  NAUSEA AND VOMITING THAT IS NOT CONTROLLED WITH YOUR NAUSEA MEDICATION  *UNUSUAL SHORTNESS OF BREATH  *UNUSUAL BRUISING OR BLEEDING  TENDERNESS IN MOUTH AND THROAT WITH OR WITHOUT PRESENCE OF ULCERS  *URINARY PROBLEMS  *BOWEL PROBLEMS  UNUSUAL RASH Items with * indicate a potential emergency and should be followed up as soon as possible.  Feel free to call the clinic you have any questions or concerns. The clinic phone number is [phone].  Please show the [facility] at check-in to the Emergency Department and triage nurse.

## 2015-04-29 NOTE — Progress Notes (Signed)
Per Dr. [name]: No additional labs needed, first cycle today. Treat with labs from 4/14.

## 2015-04-29 NOTE — Progress Notes (Signed)
VASCULAR LAB PRELIMINARY  PRELIMINARY  PRELIMINARY  PRELIMINARY  Left lower extremity venous duplex completed.    Preliminary report:  Left:  No evidence of DVT, superficial thrombosis, or Baker's cyst. Fluid noted on the anterolateral aspect of the knee.  Timmie Dugue, RVT 04/29/2015, 4:11 PM

## 2015-05-06 ENCOUNTER — Other Ambulatory Visit: Payer: Self-pay | Admitting: *Deleted

## 2015-05-06 ENCOUNTER — Ambulatory Visit (HOSPITAL_BASED_OUTPATIENT_CLINIC_OR_DEPARTMENT_OTHER): Payer: Medicare Other | Admitting: Oncology

## 2015-05-06 ENCOUNTER — Encounter: Payer: Self-pay | Admitting: *Deleted

## 2015-05-06 ENCOUNTER — Encounter: Payer: Self-pay | Admitting: Oncology

## 2015-05-06 ENCOUNTER — Ambulatory Visit (HOSPITAL_BASED_OUTPATIENT_CLINIC_OR_DEPARTMENT_OTHER): Payer: Medicare Other

## 2015-05-06 ENCOUNTER — Other Ambulatory Visit (HOSPITAL_BASED_OUTPATIENT_CLINIC_OR_DEPARTMENT_OTHER): Payer: Medicare Other

## 2015-05-06 VITALS — BP 139/79 | HR 81 | Temp 98.0°F | Resp 18 | Ht 63.5 in | Wt 173.5 lb

## 2015-05-06 DIAGNOSIS — M8580 Other specified disorders of bone density and structure, unspecified site: Secondary | ICD-10-CM | POA: Diagnosis not present

## 2015-05-06 DIAGNOSIS — C50412 Malignant neoplasm of upper-outer quadrant of left female breast: Secondary | ICD-10-CM

## 2015-05-06 DIAGNOSIS — R52 Pain, unspecified: Secondary | ICD-10-CM

## 2015-05-06 DIAGNOSIS — C50511 Malignant neoplasm of lower-outer quadrant of right female breast: Secondary | ICD-10-CM

## 2015-05-06 DIAGNOSIS — Z5111 Encounter for antineoplastic chemotherapy: Secondary | ICD-10-CM

## 2015-05-06 DIAGNOSIS — Z5112 Encounter for antineoplastic immunotherapy: Secondary | ICD-10-CM | POA: Diagnosis present

## 2015-05-06 LAB — COMPREHENSIVE METABOLIC PANEL
ALT: 20 U/L (ref 0–55)
AST: 14 U/L (ref 5–34)
Albumin: 3.7 g/dL (ref 3.5–5.0)
Alkaline Phosphatase: 56 U/L (ref 40–150)
Anion Gap: 7 mEq/L (ref 3–11)
BUN: 10.8 mg/dL (ref 7.0–26.0)
CALCIUM: 10.1 mg/dL (ref 8.4–10.4)
CHLORIDE: 108 meq/L (ref 98–109)
CO2: 27 meq/L (ref 22–29)
CREATININE: 1 mg/dL (ref 0.6–1.1)
EGFR: 54 mL/min/{1.73_m2} — ABNORMAL LOW (ref >90)
Glucose: 115 mg/dl (ref 70–140)
Potassium: 4.4 mEq/L (ref 3.5–5.1)
Sodium: 142 mEq/L (ref 136–145)
TOTAL PROTEIN: 6.9 g/dL (ref 6.4–8.3)
Total Bilirubin: 0.35 mg/dL (ref 0.20–1.20)

## 2015-05-06 LAB — CBC WITH DIFFERENTIAL/PLATELET
BASO%: 0.8 % (ref 0.0–2.0)
Basophils Absolute: 0 10*3/uL (ref 0.0–0.1)
EOS%: 4.8 % (ref 0.0–7.0)
Eosinophils Absolute: 0.2 10*3/uL (ref 0.0–0.5)
HEMATOCRIT: 38.8 % (ref 34.8–46.6)
HGB: 12.9 g/dL (ref 11.6–15.9)
LYMPH#: 1.1 10*3/uL (ref 0.9–3.3)
LYMPH%: 23.8 % (ref 14.0–49.7)
MCH: 29.4 pg (ref 25.1–34.0)
MCHC: 33.2 g/dL (ref 31.5–36.0)
MCV: 88.4 fL (ref 79.5–101.0)
MONO#: 0.3 10*3/uL (ref 0.1–0.9)
MONO%: 5.7 % (ref 0.0–14.0)
NEUT%: 64.9 % (ref 38.4–76.8)
NEUTROS ABS: 3.1 10*3/uL (ref 1.5–6.5)
Platelets: 210 10*3/uL (ref 145–400)
RBC: 4.39 10*6/uL (ref 3.70–5.45)
RDW: 13.5 % (ref 11.2–14.5)
WBC: 4.8 10*3/uL (ref 3.9–10.3)

## 2015-05-06 MED ORDER — SODIUM CHLORIDE 0.9 % IV SOLN
80.0000 mg/m2 | Freq: Once | INTRAVENOUS | Status: AC
  Administered 2015-05-06: 150 mg via INTRAVENOUS
  Filled 2015-05-06: qty 25

## 2015-05-06 MED ORDER — DIPHENHYDRAMINE HCL 50 MG/ML IJ SOLN
25.0000 mg | Freq: Once | INTRAMUSCULAR | Status: AC
  Administered 2015-05-06: 25 mg via INTRAVENOUS

## 2015-05-06 MED ORDER — SODIUM CHLORIDE 0.9 % IV SOLN
Freq: Once | INTRAVENOUS | Status: AC
  Administered 2015-05-06: 10:00:00 via INTRAVENOUS

## 2015-05-06 MED ORDER — SODIUM CHLORIDE 0.9 % IV SOLN
10.0000 mg | Freq: Once | INTRAVENOUS | Status: AC
  Administered 2015-05-06: 10 mg via INTRAVENOUS
  Filled 2015-05-06: qty 1

## 2015-05-06 MED ORDER — SODIUM CHLORIDE 0.9% FLUSH
10.0000 mL | INTRAVENOUS | Status: DC | PRN
  Administered 2015-05-06: 10 mL
  Filled 2015-05-06: qty 10

## 2015-05-06 MED ORDER — DIPHENHYDRAMINE HCL 50 MG/ML IJ SOLN
INTRAMUSCULAR | Status: AC
  Filled 2015-05-06: qty 1

## 2015-05-06 MED ORDER — TRASTUZUMAB CHEMO INJECTION 440 MG
2.0000 mg/kg | Freq: Once | INTRAVENOUS | Status: AC
  Administered 2015-05-06: 168 mg via INTRAVENOUS
  Filled 2015-05-06: qty 8

## 2015-05-06 MED ORDER — HEPARIN SOD (PORK) LOCK FLUSH 100 UNIT/ML IV SOLN
500.0000 [IU] | Freq: Once | INTRAVENOUS | Status: AC | PRN
  Administered 2015-05-06: 500 [IU]
  Filled 2015-05-06: qty 5

## 2015-05-06 MED ORDER — ACETAMINOPHEN 325 MG PO TABS
650.0000 mg | ORAL_TABLET | Freq: Once | ORAL | Status: AC
  Administered 2015-05-06: 650 mg via ORAL

## 2015-05-06 MED ORDER — FAMOTIDINE IN NACL 20-0.9 MG/50ML-% IV SOLN
20.0000 mg | Freq: Once | INTRAVENOUS | Status: AC
  Administered 2015-05-06: 20 mg via INTRAVENOUS

## 2015-05-06 MED ORDER — ACETAMINOPHEN 325 MG PO TABS
ORAL_TABLET | ORAL | Status: AC
Start: 2015-05-06 — End: 2015-05-06
  Filled 2015-05-06: qty 2

## 2015-05-06 MED ORDER — FAMOTIDINE IN NACL 20-0.9 MG/50ML-% IV SOLN
INTRAVENOUS | Status: AC
  Filled 2015-05-06: qty 50

## 2015-05-06 NOTE — Patient Instructions (Signed)
Trastuzumab injection for infusion What is this medicine? TRASTUZUMAB (tras TOO zoo mab) is a monoclonal antibody. It is used to treat breast cancer and stomach cancer. This medicine may be used for other purposes; ask your health care provider or pharmacist if you have questions. What should I tell my health care provider before I take this medicine? They need to know if you have any of these conditions: -heart disease -heart failure -infection (especially a virus infection such as chickenpox, cold sores, or herpes) -lung or breathing disease, like asthma -recent or ongoing radiation therapy -an unusual or allergic reaction to trastuzumab, benzyl alcohol, or other medications, foods, dyes, or preservatives -pregnant or trying to get pregnant -breast-feeding How should I use this medicine? This drug is given as an infusion into a vein. It is administered in a hospital or clinic by a specially trained health care professional. Talk to your pediatrician regarding the use of this medicine in children. This medicine is not approved for use in children. Overdosage: If you think you have taken too much of this medicine contact a poison control center or emergency room at once. NOTE: This medicine is only for you. Do not share this medicine with others. What if I miss a dose? It is important not to miss a dose. Call your doctor or health care professional if you are unable to keep an appointment. What may interact with this medicine? -doxorubicin -warfarin This list may not describe all possible interactions. Give your health care provider a list of all the medicines, herbs, non-prescription drugs, or dietary supplements you use. Also tell them if you smoke, drink alcohol, or use illegal drugs. Some items may interact with your medicine. What should I watch for while using this medicine? Visit your doctor for checks on your progress. Report any side effects. Continue your course of treatment even  though you feel ill unless your doctor tells you to stop. Call your doctor or health care professional for advice if you get a fever, chills or sore throat, or other symptoms of a cold or flu. Do not treat yourself. Try to avoid being around people who are sick. You may experience fever, chills and shaking during your first infusion. These effects are usually mild and can be treated with other medicines. Report any side effects during the infusion to your health care professional. Fever and chills usually do not happen with later infusions. Do not become pregnant while taking this medicine or for 7 months after stopping it. Women should inform their doctor if they wish to become pregnant or think they might be pregnant. Women of child-bearing potential will need to have a negative pregnancy test before starting this medicine. There is a potential for serious side effects to an unborn child. Talk to your health care professional or pharmacist for more information. Do not breast-feed an infant while taking this medicine or for 7 months after stopping it. Women must use effective birth control with this medicine. What side effects may I notice from receiving this medicine? Side effects that you should report to your doctor or other health care professional as soon as possible: -breathing difficulties -chest pain or palpitations -cough -dizziness or fainting -fever or chills, sore throat -skin rash, itching or hives -swelling of the legs or ankles -unusually weak or tired Side effects that usually do not require medical attention (report to your doctor or other health care professional if they continue or are bothersome): -loss of appetite -headache -muscle aches -nausea This   list may not describe all possible side effects. Call your doctor for medical advice about side effects. You may report side effects to FDA at 1-800-FDA-1088. Where should I keep my medicine? This drug is given in a hospital  or clinic and will not be stored at home. NOTE: This sheet is a summary. It may not cover all possible information. If you have questions about this medicine, talk to your doctor, pharmacist, or health care provider.    2016, [employer]. (2014-03-29 11:49:32) Paclitaxel injection What is this medicine? PACLITAXEL (PAK li TAX el) is a chemotherapy drug. It targets fast dividing cells, like cancer cells, and causes these cells to die. This medicine is used to treat ovarian cancer, breast cancer, and other cancers. This medicine may be used for other purposes; ask your health care provider or pharmacist if you have questions. What should I tell my health care provider before I take this medicine? They need to know if you have any of these conditions: -blood disorders -irregular heartbeat -infection (especially a virus infection such as chickenpox, cold sores, or herpes) -liver disease -previous or ongoing radiation therapy -an unusual or allergic reaction to paclitaxel, alcohol, polyoxyethylated castor oil, other chemotherapy agents, other medicines, foods, dyes, or preservatives -pregnant or trying to get pregnant -breast-feeding How should I use this medicine? This drug is given as an infusion into a vein. It is administered in a hospital or clinic by a specially trained health care professional. Talk to your pediatrician regarding the use of this medicine in children. Special care may be needed. Overdosage: If you think you have taken too much of this medicine contact a poison control center or emergency room at once. NOTE: This medicine is only for you. Do not share this medicine with others. What if I miss a dose? It is important not to miss your dose. Call your doctor or health care professional if you are unable to keep an appointment. What may interact with this medicine? Do not take this medicine with any of the following medications: -disulfiram -metronidazole This  medicine may also interact with the following medications: -cyclosporine -diazepam -ketoconazole -medicines to increase blood counts like filgrastim, pegfilgrastim, sargramostim -other chemotherapy drugs like cisplatin, doxorubicin, epirubicin, etoposide, teniposide, vincristine -quinidine -testosterone -vaccines -verapamil Talk to your doctor or health care professional before taking any of these medicines: -acetaminophen -aspirin -ibuprofen -ketoprofen -naproxen This list may not describe all possible interactions. Give your health care provider a list of all the medicines, herbs, non-prescription drugs, or dietary supplements you use. Also tell them if you smoke, drink alcohol, or use illegal drugs. Some items may interact with your medicine. What should I watch for while using this medicine? Your condition will be monitored carefully while you are receiving this medicine. You will need important blood work done while you are taking this medicine. This drug may make you feel generally unwell. This is not uncommon, as chemotherapy can affect healthy cells as well as cancer cells. Report any side effects. Continue your course of treatment even though you feel ill unless your doctor tells you to stop. This medicine can cause serious allergic reactions. To reduce your risk you will need to take other medicine(s) before treatment with this medicine. In some cases, you may be given additional medicines to help with side effects. Follow all directions for their use. Call your doctor or health care professional for advice if you get a fever, chills or sore throat, or other symptoms of a cold or  flu. Do not treat yourself. This drug decreases your body's ability to fight infections. Try to avoid being around people who are sick. This medicine may increase your risk to bruise or bleed. Call your doctor or health care professional if you notice any unusual bleeding. Be careful brushing and flossing  your teeth or using a toothpick because you may get an infection or bleed more easily. If you have any dental work done, tell your dentist you are receiving this medicine. Avoid taking products that contain aspirin, acetaminophen, ibuprofen, naproxen, or ketoprofen unless instructed by your doctor. These medicines may hide a fever. Do not become pregnant while taking this medicine. Women should inform their doctor if they wish to become pregnant or think they might be pregnant. There is a potential for serious side effects to an unborn child. Talk to your health care professional or pharmacist for more information. Do not breast-feed an infant while taking this medicine. Men are advised not to father a child while receiving this medicine. This product may contain alcohol. Ask your pharmacist or healthcare provider if this medicine contains alcohol. Be sure to tell all healthcare providers you are taking this medicine. Certain medicines, like metronidazole and disulfiram, can cause an unpleasant reaction when taken with alcohol. The reaction includes flushing, headache, nausea, vomiting, sweating, and increased thirst. The reaction can last from 30 minutes to several hours. What side effects may I notice from receiving this medicine? Side effects that you should report to your doctor or health care professional as soon as possible: -allergic reactions like skin rash, itching or hives, swelling of the face, lips, or tongue -low blood counts - This drug may decrease the number of white blood cells, red blood cells and platelets. You may be at increased risk for infections and bleeding. -signs of infection - fever or chills, cough, sore throat, pain or difficulty passing urine -signs of decreased platelets or bleeding - bruising, pinpoint red spots on the skin, black, tarry stools, nosebleeds -signs of decreased red blood cells - unusually weak or tired, fainting spells, lightheadedness -breathing  problems -chest pain -high or low blood pressure -mouth sores -nausea and vomiting -pain, swelling, redness or irritation at the injection site -pain, tingling, numbness in the hands or feet -slow or irregular heartbeat -swelling of the ankle, feet, hands Side effects that usually do not require medical attention (report to your doctor or health care professional if they continue or are bothersome): -bone pain -complete hair loss including hair on your head, underarms, pubic hair, eyebrows, and eyelashes -changes in the color of fingernails -diarrhea -loosening of the fingernails -loss of appetite -muscle or joint pain -red flush to skin -sweating This list may not describe all possible side effects. Call your doctor for medical advice about side effects. You may report side effects to FDA at 1-800-FDA-1088. Where should I keep my medicine? This drug is given in a hospital or clinic and will not be stored at home. NOTE: This sheet is a summary. It may not cover all possible information. If you have questions about this medicine, talk to your doctor, pharmacist, or health care provider.    2016, [employer]. (2014-08-08 13:02:56)

## 2015-05-06 NOTE — Progress Notes (Signed)
Introduced myself as her FA.  Informed her of the [employer] [facility] that has funding available for breast cancer but unfortunately she does not meet the income requirements for that assistance.  She also exceeds the income requirements for the [employer].  I gave her my card for any questions or concerns she may have in the future.

## 2015-05-06 NOTE — Progress Notes (Signed)
Isabella Cook  [phone] [phone]     ID: Eun Vermeer DOB: [birth year]  MR#: [id number]  OEV#:[id number]  Patient Care Team: [name], FNP as PCP - General (Family Medicine) [name], MD as Consulting Physician (Oncology) [name], MD as Consulting Physician (General Surgery) [name], NP as Nurse Practitioner (Hematology and Oncology) [name], MD as Consulting Physician (Oncology) Thea Silversmith, MD as Consulting Physician (Radiation Oncology) [name], MD as Consulting Physician (Neurosurgery) [name], MD as Referring Physician (Otolaryngology) [name], MD as Consulting Physician (Cardiology) PCP: [name], FNP OTHER MD:  CHIEF COMPLAINT: HER-2 positive breast cancer, bilateral beast cancers  CURRENT TREATMENT: Paclitaxel, trastuzumab   BREAST CANCER HISTORY:   From the original intake note:  Loreena had routine screening mammography at [facility] suggesting an area of indeterminate microcalcifications in the upper outer quadrant of the left breast measuring 8 mm. She was referred for left breast stereotactic biopsy performed at the [facility] 02/27/2015. This showed ([id number]) invasive ductal carcinoma, grade 2, estrogen receptor 100% positive, progesterone receptor 100% positive, both with strong staining intensity, with an MIB-1 of 20%, and HER-2 amplified with a signals ratio of 2.53, the number per cell being 2.65. 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  On 03/25/2015 the patient underwent bilateral breast MRI, which showed the breast density to be category B. this showed, in the left breast, an area of non-masslike enhancement measuring 7.8 cm. Associated with this were 2 adjacent irregular masses measuring 1.7 and 1.4 cm. The masses were 5 cm anterior and superior to the biopsy clip. There was a cortically thickened left axillary lymph node  noted.  Also in the right breast there was a 7 mm irregular enhancing mass at the 6:30 o'clock position.  Her subsequent history is as detailed below    INTERVAL HISTORY: Andreia returns today for follow-up of her HER-2 positive breast cancer accompanied by her husband [name].. Today is day 1 cycle 2 of 12 planned weekly doses of paclitaxel and trastuzumab.  After her last visit here we obtained a left lower extremity Doppler ultrasound, 04/29/2015, to evaluate the slight asymmetry in Thao's calves. This found no DVT although there was some fluid in the anterolateral aspect of the left knee.   REVIEW OF SYSTEMS: Alexianna did very well with her first cycle, with good energy for 2 or 3 days and then a little bit of a slump as the steroids wore off. On day 4, she started getting achy joints vertigo involving the knees hands and elbows. She took a little Tylenol with Codeine but that did not help very much. She had no problems with nausea, vomiting, mouth sores, change in bowel or bladder habits, and has not had any hair loss yet. A detailed review of systems today was otherwise noncontributory  PAST MEDICAL HISTORY: Past Medical History  Diagnosis Date  . GERD (gastroesophageal reflux disease)   . Arthritis     wrists and knees  . Cancer ([place]) 03-2015    left breast  . Multiple food allergies   . Urinary tract infection   . History of hiatal hernia   . History of jaundice as a child     PAST SURGICAL HISTORY: Past Surgical History  Procedure Laterality Date  . Abdominal hysterectomy    . Cholecystectomy    . Appendectomy    . Tubal ligation    . Back surgery      lumbar  .  Hernia repair      UHR  . Cervical disc surgery  2016  . Portacath placement Right 04/22/2015    Procedure: INSERTION PORT-A-CATH WITH [place];  Surgeon: [name], MD;  Location: [facility];  Service: General;  Laterality: Right;    FAMILY HISTORY No family history on file. The patient's father had a history of  pseudo-bulbar pulse 3 and died from a stroke at age 17. The patient's mother died at age 16 from complications of emphysema. The patient had no brothers, 2 sisters. There is no history of cancer in the family and specifically no history of breast or ovarian cancer.  GYNECOLOGIC HISTORY:  No LMP recorded. Patient has had a hysterectomy. Menarche age 30, first live birth age 21. She is GX P2. She underwent a hysterectomy with left salpingo-oophorectomy in 1984. She received hormone replacement for approximately 15 years, until 2005.  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 Her husband [name] is a retired Social research officer, government. Daughter [name] lives in [place] where she is a housewife, homeschooling her children. Son Lyberti Thrush lives in [place] and works in Chartered certified accountant. The patient has 2 grandchildren. She is not a church attender    ADVANCED DIRECTIVES: In place   HEALTH MAINTENANCE: Social History  Substance Use Topics  . Smoking status: Never Smoker   . Smokeless tobacco: Not on file  . Alcohol Use: Yes     Comment: occ 1 every 2-3 months     Colonoscopy:2014  PAP:  Bone density: 2017 [facility]//osteopenia   Lipid panel:  Allergies  Allergen Reactions  . Dairy Aid [Lactase] Anaphylaxis     Takes claritin every day and benadryl, if needed, to prevent anaphylaxis   . Eggs Or Egg-Derived Products Anaphylaxis    Nasal stuffiness, Takes claritin every day and benadryl, if needed, to prevent anaphylaxis  . Gelatin (Bovine) [Beef Extract] Anaphylaxis    Muscle pain,  Takes claritin every day and benadryl, if needed, to prevent anaphylaxis   . Gluten Meal Anaphylaxis and Diarrhea    Takes claritin every day and benadryl, if needed, to prevent anaphylaxis   . Lambs Quarters Anaphylaxis    ALL "mammal" MEAT per pt -- Muscle pain  Can eat chicken, fish, [place]  . Milk-Related  Compounds Anaphylaxis    Nasal stuffiness Cheese (mozzarella, swiss, cheddar, cottage)  . Pork Allergy Anaphylaxis    ALL "mammal" MEAT per pt -- Muscle pain  Can eat chicken, fish, [place]  . Wheat Bran Anaphylaxis and Diarrhea     Takes claritin every day and benadryl, if needed, to prevent anaphylaxis   . Whey Anaphylaxis and Diarrhea     Takes claritin every day and benadryl, if needed, to prevent anaphylaxis   . Yeast Anaphylaxis     Takes claritin every day and benadryl, if needed, to prevent anaphylaxis  . Soy Allergy Diarrhea  . Almond Oil Nausea And Vomiting    Current Outpatient Prescriptions  Medication Sig Dispense Refill  . naproxen sodium (ANAPROX) 220 MG tablet Take 440 mg by mouth 3 (three) times daily with meals.    . calcium carbonate (TUMS - DOSED IN MG ELEMENTAL CALCIUM) 500 MG chewable tablet Chew 500 mg by mouth daily as needed for indigestion or heartburn.     . diphenhydrAMINE (SOMINEX) 25 MG tablet Take 25 mg by mouth at bedtime as needed.    . famotidine (PEPCID) 20 MG  tablet Take 20 mg by mouth daily.     Marland Kitchen lidocaine-prilocaine (EMLA) cream Apply to affected area once 30 g 3  . loratadine (CLARITIN) 10 MG tablet Take 10 mg by mouth daily.    Marland Kitchen LORazepam (ATIVAN) 0.5 MG tablet Take 1 tablet (0.5 mg total) by mouth at bedtime as needed (Nausea or vomiting). 15 tablet 0  . Magnesium Citrate 100 MG TABS Take 200 mg by mouth daily.    . Methylcellulose, Laxative, (CITRUCEL) 500 MG TABS Take 2,000 mg by mouth 2 (two) times daily.    . ondansetron (ZOFRAN) 8 MG tablet Take 1 tablet (8 mg total) by mouth 2 (two) times daily as needed (Nausea or vomiting). 30 tablet 1  . Polyethyl Glycol-Propyl Glycol (SYSTANE ULTRA) 0.4-0.3 % SOLN Place 1 drop into both eyes daily as needed (dry eyes).    . Probiotic Product (ALIGN PO) Take 1 tablet by mouth daily.     . prochlorperazine (COMPAZINE) 10 MG tablet Take 1 tablet (10 mg total) by mouth every 6 (six) hours as needed  (Nausea or vomiting). 30 tablet 1  . VITAMIN D-VITAMIN K PO Take 1 capsule by mouth daily. Reported on 04/15/2015     No current facility-administered medications for this visit.    OBJECTIVE: Middle-aged white woman in no acute distress  Filed Vitals:   05/06/15 0840  BP: 139/79  Pulse: 81  Temp: 98 F (36.7 C)  Resp: 18     Body mass index is 30.25 kg/(m^2).    ECOG FS:1 - Symptomatic but completely ambulatory  Sclerae unicteric, EOMs intact Oropharynx clear, dentition in good repair No cervical or supraclavicular adenopathy Lungs no rales or rhonchi Heart regular rate and rhythm Abd soft, nontender, positive bowel sounds MSK no focal spinal tenderness, no upper extremity lymphedema Neuro: nonfocal, well oriented, appropriate affect Breasts: Deferred   LAB RESULTS:  CMP     Component Value Date/Time   NA 142 04/18/2015 0837   K 4.4 04/18/2015 0837   CO2 27 04/18/2015 0837   GLUCOSE 90 04/18/2015 0837   BUN 9.3 04/18/2015 0837   CREATININE 1.1 04/18/2015 0837   CALCIUM 10.4 04/18/2015 0837   PROT 7.2 04/18/2015 0837   ALBUMIN 3.8 04/18/2015 0837   AST 16 04/18/2015 0837   ALT 11 04/18/2015 0837   ALKPHOS 55 04/18/2015 0837   BILITOT 0.47 04/18/2015 0837    INo results found for: SPEP, UPEP  Lab Results  Component Value Date   WBC 5.2 04/18/2015   NEUTROABS 2.8 04/18/2015   HGB 13.4 04/18/2015   HCT 40.2 04/18/2015   MCV 86.9 04/18/2015   PLT 210 04/18/2015      Chemistry      Component Value Date/Time   NA 142 04/18/2015 0837   K 4.4 04/18/2015 0837   CO2 27 04/18/2015 0837   BUN 9.3 04/18/2015 0837   CREATININE 1.1 04/18/2015 0837      Component Value Date/Time   CALCIUM 10.4 04/18/2015 0837   ALKPHOS 55 04/18/2015 0837   AST 16 04/18/2015 0837   ALT 11 04/18/2015 0837   BILITOT 0.47 04/18/2015 0837       No results found for: LABCA2  No components found for: [id number]  No results for input(s): INR in the last 168  hours.  Urinalysis No results found for: COLORURINE, APPEARANCEUR, LABSPEC, PHURINE, GLUCOSEU, HGBUR, BILIRUBINUR, KETONESUR, PROTEINUR, UROBILINOGEN, NITRITE, LEUKOCYTESUR    ELIGIBLE FOR AVAILABLE RESEARCH PROTOCOL: no  STUDIES: Dg Chest Port 1 [address]  04/22/2015  CLINICAL DATA:  Status post right Port-A-Cath placement today. Initial encounter. EXAM: PORTABLE CHEST 1 VIEW COMPARISON:  None. FINDINGS: Right IJ approach Port-A-Cath is in place with the tip projecting at the mid to lower superior vena cava. No pneumothorax is identified. Mild left basilar atelectasis is seen. The lungs are otherwise clear. Heart size is normal. No focal bony abnormality. IMPRESSION: Tip of Port-A-Cath projects at the mid to lower superior vena cava. Exact position can be confirmed with a lateral view. Negative for pneumothorax. Electronically Signed   By: Inge Rise M.D.   On: 04/22/2015 15:03   Dg Fluoro Guide Cv Line-no Report  04/22/2015  CLINICAL DATA:  FLOURO GUIDE CV LINE Fluoroscopy was utilized by the requesting physician.  No radiographic interpretation.    ASSESSMENT: 75 y.o. [facility] woman  status post left breast biopsy 02/27/2015 for a clinical mT1-3 N1 invasive ductal carcinoma, grade 2, strongly estrogen and progesterone receptor positive, HER-2 amplified, with an MIB-1 of 20%.   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  (b) right breast lower outer quadrant: a clinical T1b N0, stage IA invasive ductal carcinoma, estrogen and progesterone receptor positive, HER-2 not amplified, with an Mib-1 of 4%  (2)  neoadjuvant  chemotherapy will consist of weekly paclitaxel 12 together with weekly trastuzumab 12, starting 04/29/2015  (3) trastuzumab will be continued every 3 weeks for an additional 9 months after completion of chemotherapy  (4) definitive surgery to follow chemotherapy  (5) adjuvant radiation therapy to follow surgery  (6) tamoxifen to start at the completion of local treatment  (a) the patient has been asked to discontinue Estring as of 05/05/2015, to be resumed when tamoxifen is started   PLAN: Azadeh is tolerating her chemotherapy and anti-HER-2 immunotherapy quite well. The only significant symptoms she experienced was the bony aches and pains that can occur after the first cycle of paclitaxel. Hopefully they will not occur the cycle or if they do this should be milder.  As far as pain control is concerned I suggested she try to Aleve 3 times a day with food and see what that is. We can certainly use tramadol in addition if necessary.  I encouraged her to continue vigorous exercise program, the goal being to not gain any weight (or loose any weight, during her chemotherapy treatments  She knows to call for any problems that may develop after today's treatment.  [name], MD   05/06/2015 8:58 AM Medical Oncology and Hematology [facility] [address] [place], [place] [address] Tel. [phone]    Fax. [phone]

## 2015-05-13 ENCOUNTER — Other Ambulatory Visit (HOSPITAL_BASED_OUTPATIENT_CLINIC_OR_DEPARTMENT_OTHER): Payer: Medicare Other

## 2015-05-13 ENCOUNTER — Ambulatory Visit (HOSPITAL_BASED_OUTPATIENT_CLINIC_OR_DEPARTMENT_OTHER): Payer: Medicare Other

## 2015-05-13 ENCOUNTER — Ambulatory Visit (HOSPITAL_BASED_OUTPATIENT_CLINIC_OR_DEPARTMENT_OTHER): Payer: Medicare Other | Admitting: Oncology

## 2015-05-13 VITALS — BP 135/69 | HR 77 | Temp 97.8°F | Resp 18 | Ht 63.5 in | Wt 172.1 lb

## 2015-05-13 DIAGNOSIS — M8580 Other specified disorders of bone density and structure, unspecified site: Secondary | ICD-10-CM

## 2015-05-13 DIAGNOSIS — C50511 Malignant neoplasm of lower-outer quadrant of right female breast: Secondary | ICD-10-CM

## 2015-05-13 DIAGNOSIS — Z5112 Encounter for antineoplastic immunotherapy: Secondary | ICD-10-CM

## 2015-05-13 DIAGNOSIS — Z5111 Encounter for antineoplastic chemotherapy: Secondary | ICD-10-CM

## 2015-05-13 DIAGNOSIS — C50412 Malignant neoplasm of upper-outer quadrant of left female breast: Secondary | ICD-10-CM

## 2015-05-13 LAB — CBC WITH DIFFERENTIAL/PLATELET
BASO%: 1.1 % (ref 0.0–2.0)
BASOS ABS: 0 10*3/uL (ref 0.0–0.1)
EOS ABS: 0.2 10*3/uL (ref 0.0–0.5)
EOS%: 4.9 % (ref 0.0–7.0)
HEMATOCRIT: 38.8 % (ref 34.8–46.6)
HEMOGLOBIN: 12.6 g/dL (ref 11.6–15.9)
LYMPH#: 1.2 10*3/uL (ref 0.9–3.3)
LYMPH%: 33.8 % (ref 14.0–49.7)
MCH: 28.6 pg (ref 25.1–34.0)
MCHC: 32.4 g/dL (ref 31.5–36.0)
MCV: 88.3 fL (ref 79.5–101.0)
MONO#: 0.3 10*3/uL (ref 0.1–0.9)
MONO%: 8.3 % (ref 0.0–14.0)
NEUT#: 1.9 10*3/uL (ref 1.5–6.5)
NEUT%: 51.9 % (ref 38.4–76.8)
Platelets: 249 10*3/uL (ref 145–400)
RBC: 4.4 10*6/uL (ref 3.70–5.45)
RDW: 13.7 % (ref 11.2–14.5)
WBC: 3.6 10*3/uL — ABNORMAL LOW (ref 3.9–10.3)

## 2015-05-13 LAB — COMPREHENSIVE METABOLIC PANEL
ALBUMIN: 3.8 g/dL (ref 3.5–5.0)
ALK PHOS: 54 U/L (ref 40–150)
ALT: 19 U/L (ref 0–55)
AST: 14 U/L (ref 5–34)
Anion Gap: 7 mEq/L (ref 3–11)
BUN: 12.1 mg/dL (ref 7.0–26.0)
CALCIUM: 10.2 mg/dL (ref 8.4–10.4)
CO2: 26 mEq/L (ref 22–29)
Chloride: 108 mEq/L (ref 98–109)
Creatinine: 1 mg/dL (ref 0.6–1.1)
EGFR: 58 mL/min/{1.73_m2} — AB (ref >90)
Glucose: 110 mg/dl (ref 70–140)
POTASSIUM: 4.1 meq/L (ref 3.5–5.1)
Sodium: 142 mEq/L (ref 136–145)
Total Bilirubin: 0.3 mg/dL (ref 0.20–1.20)
Total Protein: 6.9 g/dL (ref 6.4–8.3)

## 2015-05-13 MED ORDER — SODIUM CHLORIDE 0.9 % IV SOLN
Freq: Once | INTRAVENOUS | Status: AC
  Administered 2015-05-13: 14:00:00 via INTRAVENOUS

## 2015-05-13 MED ORDER — SODIUM CHLORIDE 0.9% FLUSH
10.0000 mL | INTRAVENOUS | Status: DC | PRN
  Administered 2015-05-13: 10 mL
  Filled 2015-05-13: qty 10

## 2015-05-13 MED ORDER — SODIUM CHLORIDE 0.9 % IV SOLN
4.0000 mg | Freq: Once | INTRAVENOUS | Status: AC
  Administered 2015-05-13: 4 mg via INTRAVENOUS
  Filled 2015-05-13: qty 0.4

## 2015-05-13 MED ORDER — ACETAMINOPHEN 325 MG PO TABS
650.0000 mg | ORAL_TABLET | Freq: Once | ORAL | Status: AC
  Administered 2015-05-13: 650 mg via ORAL

## 2015-05-13 MED ORDER — ACETAMINOPHEN 325 MG PO TABS
ORAL_TABLET | ORAL | Status: AC
  Filled 2015-05-13: qty 2

## 2015-05-13 MED ORDER — SODIUM CHLORIDE 0.9 % IV SOLN
80.0000 mg/m2 | Freq: Once | INTRAVENOUS | Status: AC
  Administered 2015-05-13: 150 mg via INTRAVENOUS
  Filled 2015-05-13: qty 25

## 2015-05-13 MED ORDER — FAMOTIDINE IN NACL 20-0.9 MG/50ML-% IV SOLN
20.0000 mg | Freq: Once | INTRAVENOUS | Status: AC
  Administered 2015-05-13: 20 mg via INTRAVENOUS

## 2015-05-13 MED ORDER — TRASTUZUMAB CHEMO INJECTION 440 MG
2.0000 mg/kg | Freq: Once | INTRAVENOUS | Status: AC
  Administered 2015-05-13: 168 mg via INTRAVENOUS
  Filled 2015-05-13: qty 8

## 2015-05-13 MED ORDER — DIPHENHYDRAMINE HCL 50 MG/ML IJ SOLN
INTRAMUSCULAR | Status: AC
  Filled 2015-05-13: qty 1

## 2015-05-13 MED ORDER — HEPARIN SOD (PORK) LOCK FLUSH 100 UNIT/ML IV SOLN
500.0000 [IU] | Freq: Once | INTRAVENOUS | Status: AC | PRN
  Administered 2015-05-13: 500 [IU]
  Filled 2015-05-13: qty 5

## 2015-05-13 MED ORDER — FAMOTIDINE IN NACL 20-0.9 MG/50ML-% IV SOLN
INTRAVENOUS | Status: AC
  Filled 2015-05-13: qty 50

## 2015-05-13 MED ORDER — DIPHENHYDRAMINE HCL 50 MG/ML IJ SOLN
25.0000 mg | Freq: Once | INTRAMUSCULAR | Status: AC
  Administered 2015-05-13: 25 mg via INTRAVENOUS

## 2015-05-13 NOTE — Patient Instructions (Signed)
[facility] Discharge Instructions for Patients Receiving Chemotherapy  Today you received the following chemotherapy agents: Taxol and Herceptin.  To help prevent nausea and vomiting after your treatment, we encourage you to take your nausea medication: Compazine 10 mg every 6 hours as needed; Zofran 8 mg every 12 hours as needed.   If you develop nausea and vomiting that is not controlled by your nausea medication, call the clinic.   BELOW ARE SYMPTOMS THAT SHOULD BE REPORTED IMMEDIATELY:  *FEVER GREATER THAN 100.5 F  *CHILLS WITH OR WITHOUT FEVER  NAUSEA AND VOMITING THAT IS NOT CONTROLLED WITH YOUR NAUSEA MEDICATION  *UNUSUAL SHORTNESS OF BREATH  *UNUSUAL BRUISING OR BLEEDING  TENDERNESS IN MOUTH AND THROAT WITH OR WITHOUT PRESENCE OF ULCERS  *URINARY PROBLEMS  *BOWEL PROBLEMS  UNUSUAL RASH Items with * indicate a potential emergency and should be followed up as soon as possible.  Feel free to call the clinic you have any questions or concerns. The clinic phone number is [phone].  Please show the [facility] at check-in to the Emergency Department and triage nurse.

## 2015-05-13 NOTE — Progress Notes (Signed)
[name]  [phone]) [phone] Fax:(336) [phone]     ID: Isabella Cook DOB: 1940-07-09  MR#: [id number]  SEG#:[id number]  Patient Care Team: [name], FNP as PCP - General (Family Medicine) [name], MD as Consulting Physician (Oncology) [name], MD as Consulting Physician (General Surgery) [name], NP as Nurse Practitioner (Hematology and Oncology) [name], MD as Consulting Physician (Oncology) Thea Silversmith, MD as Consulting Physician (Radiation Oncology) [name], MD as Consulting Physician (Neurosurgery) [name], MD as Referring Physician (Otolaryngology) [name], MD as Consulting Physician (Cardiology) PCP: [name], FNP OTHER MD:  CHIEF COMPLAINT: HER-2 positive breast cancer, bilateral beast cancers  CURRENT TREATMENT: Paclitaxel, trastuzumab   BREAST CANCER HISTORY:   From the original intake note:  Isabella Cook had routine screening mammography at [facility] suggesting an area of indeterminate microcalcifications in the upper outer quadrant of the left breast measuring 8 mm. She was referred for left breast stereotactic biopsy performed at the [facility] 02/27/2015. This showed ([id number]) invasive ductal carcinoma, grade 2, estrogen receptor 100% positive, progesterone receptor 100% positive, both with strong staining intensity, with an MIB-1 of 20%, and HER-2 amplified with a signals ratio of 2.53, the number per cell being 2.65. 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  On 03/25/2015 the patient underwent bilateral breast MRI, which showed the breast density to be category B. this showed, in the left breast, an area of non-masslike enhancement measuring 7.8 cm. Associated with this were 2 adjacent irregular masses measuring 1.7 and 1.4 cm. The masses were 5 cm anterior and superior to the biopsy clip. There was a cortically thickened left axillary lymph node  noted.  Also in the right breast there was a 7 mm irregular enhancing mass at the 6:30 o'clock position.  Her subsequent history is as detailed below    INTERVAL HISTORY: Isabella Cook returns today for follow-up of her left-sided breast cancer, accompanied by her husband Isabella Cook.. Today is day 1 cycle 3 of 12 planned weekly doses of paclitaxel and trastuzumab.  Since her last visit here her dog is being evaluated for possible leptospirosis.  REVIEW OF SYSTEMS: [name] tolerated her first 2 cycles remarkably well, and has not even had any significant hair loss so far. She has had no nausea or vomiting, no peripheral neuropathy, and only mild constipation which she is taking care of with mild laxatives. Note that she has had no fever or rash. A detailed review of systems today was otherwise noncontributory  PAST MEDICAL HISTORY: Past Medical History  Diagnosis Date  . GERD (gastroesophageal reflux disease)   . Arthritis     wrists and knees  . Cancer ([place]) 03-2015    left breast  . Multiple food allergies   . Urinary tract infection   . History of hiatal hernia   . History of jaundice as a child     PAST SURGICAL HISTORY: Past Surgical History  Procedure Laterality Date  . Abdominal hysterectomy    . Cholecystectomy    . Appendectomy    . Tubal ligation    . Back surgery      lumbar  . Hernia repair      UHR  . Cervical disc surgery  2016  . Portacath placement Right 04/22/2015    Procedure: INSERTION PORT-A-CATH WITH [place];  Surgeon: [name], MD;  Location: [facility];  Service: General;  Laterality: Right;    FAMILY HISTORY No family history on file. The patient's  father had a history of pseudo-bulbar pulse 74 and died from a stroke at age 28. The patient's mother died at age 106 from complications of emphysema. The patient had no brothers, 2 sisters. There is no history of cancer in the family and specifically no history of breast or ovarian cancer.  GYNECOLOGIC HISTORY:  No LMP  recorded. Patient has had a hysterectomy. Menarche age 35, first live birth age 75. She is GX P2. She underwent a hysterectomy with left salpingo-oophorectomy in 1984. She received hormone replacement for approximately 15 years, until 2005.  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 Her husband Isabella Cook is a retired Social research officer, government. Daughter Isabella Cook lives in [place] where she is a housewife, homeschooling her children. Son Isabella Cook lives in [place] and works in Chartered certified accountant. The patient has 2 grandchildren. She is not a church attender    ADVANCED DIRECTIVES: In place   HEALTH MAINTENANCE: Social History  Substance Use Topics  . Smoking status: Never Smoker   . Smokeless tobacco: Not on file  . Alcohol Use: Yes     Comment: occ 1 every 2-3 months     Colonoscopy:2014  PAP:  Bone density: 2017 [facility]//osteopenia   Lipid panel:  Allergies  Allergen Reactions  . Dairy Aid [Lactase] Anaphylaxis     Takes claritin every day and benadryl, if needed, to prevent anaphylaxis   . Eggs Or Egg-Derived Products Anaphylaxis    Nasal stuffiness, Takes claritin every day and benadryl, if needed, to prevent anaphylaxis  . Gelatin (Bovine) [Beef Extract] Anaphylaxis    Muscle pain,  Takes claritin every day and benadryl, if needed, to prevent anaphylaxis   . Gluten Meal Anaphylaxis and Diarrhea    Takes claritin every day and benadryl, if needed, to prevent anaphylaxis   . Lambs Quarters Anaphylaxis    ALL "mammal" MEAT per pt -- Muscle pain  Can eat chicken, fish, [place]  . Milk-Related Compounds Anaphylaxis    Nasal stuffiness Cheese (mozzarella, swiss, cheddar, cottage)  . Pork Allergy Anaphylaxis    ALL "mammal" MEAT per pt -- Muscle pain  Can eat chicken, fish, [place]  . Wheat Bran Anaphylaxis and Diarrhea     Takes claritin every day and benadryl, if needed, to prevent  anaphylaxis   . Whey Anaphylaxis and Diarrhea     Takes claritin every day and benadryl, if needed, to prevent anaphylaxis   . Yeast Anaphylaxis     Takes claritin every day and benadryl, if needed, to prevent anaphylaxis  . Soy Allergy Diarrhea  . Almond Oil Nausea And Vomiting    Current Outpatient Prescriptions  Medication Sig Dispense Refill  . calcium carbonate (TUMS - DOSED IN MG ELEMENTAL CALCIUM) 500 MG chewable tablet Chew 500 mg by mouth daily as needed for indigestion or heartburn.     . diphenhydrAMINE (SOMINEX) 25 MG tablet Take 25 mg by mouth at bedtime as needed.    . famotidine (PEPCID) 20 MG tablet Take 20 mg by mouth daily.     Marland Kitchen lidocaine-prilocaine (EMLA) cream Apply to affected area once 30 g 3  . loratadine (CLARITIN) 10 MG tablet Take 10 mg by mouth daily.    Marland Kitchen LORazepam (ATIVAN) 0.5 MG tablet Take 1 tablet (0.5 mg total) by mouth at bedtime as needed (Nausea or vomiting). 15 tablet 0  . Magnesium Citrate 100 MG TABS Take 200 mg by mouth daily.    Marland Kitchen  Methylcellulose, Laxative, (CITRUCEL) 500 MG TABS Take 2,000 mg by mouth 2 (two) times daily.    . naproxen sodium (ANAPROX) 220 MG tablet Take 440 mg by mouth 3 (three) times daily with meals.    . ondansetron (ZOFRAN) 8 MG tablet Take 1 tablet (8 mg total) by mouth 2 (two) times daily as needed (Nausea or vomiting). 30 tablet 1  . Polyethyl Glycol-Propyl Glycol (SYSTANE ULTRA) 0.4-0.3 % SOLN Place 1 drop into both eyes daily as needed (dry eyes).    . Probiotic Product (ALIGN PO) Take 1 tablet by mouth daily.     . prochlorperazine (COMPAZINE) 10 MG tablet Take 1 tablet (10 mg total) by mouth every 6 (six) hours as needed (Nausea or vomiting). 30 tablet 1  . VITAMIN D-VITAMIN K PO Take 1 capsule by mouth daily. Reported on 04/15/2015     No current facility-administered medications for this visit.    OBJECTIVE: Middle-aged white woman who appears well Filed Vitals:   05/13/15 1213  BP: 135/69  Pulse: 77  Temp:  97.8 F (36.6 C)  Resp: 18     Body mass index is 30 kg/(m^2).    ECOG FS:0 - Asymptomatic  Sclerae unicteric, pupils round and equal Oropharynx clear and moist-- no thrush or other lesions No cervical or supraclavicular adenopathy Lungs no rales or rhonchi Heart regular rate and rhythm Abd soft, nontender, positive bowel sounds MSK no focal spinal tenderness, no upper extremity lymphedema Neuro: nonfocal, well oriented, appropriate affect Breasts: the right breast is unremarkable. I do not palpate any mass in the left breast and there is no skin or nipple changes of concern. The left axilla is benign.   LAB RESULTS:  CMP     Component Value Date/Time   NA 142 05/06/2015 0915   K 4.4 05/06/2015 0915   CO2 27 05/06/2015 0915   GLUCOSE 115 05/06/2015 0915   BUN 10.8 05/06/2015 0915   CREATININE 1.0 05/06/2015 0915   CALCIUM 10.1 05/06/2015 0915   PROT 6.9 05/06/2015 0915   ALBUMIN 3.7 05/06/2015 0915   AST 14 05/06/2015 0915   ALT 20 05/06/2015 0915   ALKPHOS 56 05/06/2015 0915   BILITOT 0.35 05/06/2015 0915    INo results found for: SPEP, UPEP  Lab Results  Component Value Date   WBC 3.6* 05/13/2015   NEUTROABS 1.9 05/13/2015   HGB 12.6 05/13/2015   HCT 38.8 05/13/2015   MCV 88.3 05/13/2015   PLT 249 05/13/2015      Chemistry      Component Value Date/Time   NA 142 05/06/2015 0915   K 4.4 05/06/2015 0915   CO2 27 05/06/2015 0915   BUN 10.8 05/06/2015 0915   CREATININE 1.0 05/06/2015 0915      Component Value Date/Time   CALCIUM 10.1 05/06/2015 0915   ALKPHOS 56 05/06/2015 0915   AST 14 05/06/2015 0915   ALT 20 05/06/2015 0915   BILITOT 0.35 05/06/2015 0915       No results found for: LABCA2  No components found for: LABCA125  No results for input(s): INR in the last 168 hours.  Urinalysis No results found for: COLORURINE, APPEARANCEUR, LABSPEC, PHURINE, GLUCOSEU, HGBUR, BILIRUBINUR, KETONESUR, PROTEINUR, UROBILINOGEN, NITRITE,  LEUKOCYTESUR    ELIGIBLE FOR AVAILABLE RESEARCH PROTOCOL: no  STUDIES: Dg Chest Port 1 View  04/22/2015  CLINICAL DATA:  Status post right Port-A-Cath placement today. Initial encounter. EXAM: PORTABLE CHEST 1 VIEW COMPARISON:  None. FINDINGS: Right IJ approach Port-A-Cath is in place  with the tip projecting at the mid to lower superior vena cava. No pneumothorax is identified. Mild left basilar atelectasis is seen. The lungs are otherwise clear. Heart size is normal. No focal bony abnormality. IMPRESSION: Tip of Port-A-Cath projects at the mid to lower superior vena cava. Exact position can be confirmed with a lateral view. Negative for pneumothorax. Electronically Signed   By: Inge Rise M.D.   On: 04/22/2015 15:03   Dg Fluoro Guide Cv Line-no Report  04/22/2015  CLINICAL DATA:  FLOURO GUIDE CV LINE Fluoroscopy was utilized by the requesting physician.  No radiographic interpretation.    ASSESSMENT: 75 y.o. [facility] woman  status post left breast biopsy 02/27/2015 for a clinical mT1-3 N1 invasive ductal carcinoma, grade 2, strongly estrogen and progesterone receptor positive, HER-2 amplified, with an MIB-1 of 20%.   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  (b) right breast lower outer quadrant: a clinical T1b N0, stage IA invasive ductal carcinoma, estrogen and progesterone receptor positive, HER-2 not amplified, with an Mib-1 of 4%  (2)  neoadjuvant chemotherapy consisting of weekly paclitaxel 12 together with weekly trastuzumab 12, started 04/29/2015  (3) trastuzumab will be continued every 3 weeks for an  additional 9 months after completion of chemotherapy  (a) echo 04/25/2015 shows an ejection fraction of 55-60% strong yes doing well K okay okay C city would want to get an elderly age he it never hurts to get a couple of tumor markers so like a CEA might not her does she have a lot of stuff in the abdomen or like that she have carcinomatosis or result S lymph nodes IC I would also the LDH. Beta 2 microglobulin and then I would consider a referral to Dr. Loney Laurence you could do that before or C-outpatient either she is the one who does her lymphomas yes take care of  (4) definitive surgery to follow chemotherapy  (5) adjuvant radiation therapy to follow surgery  (6) tamoxifen to start at the completion of local treatment  (a) the patient has been asked to discontinue Estring as of 05/05/2015, to be resumed when tamoxifen is started   PLAN: Anisa continues to tolerate the chemotherapy remarkably well. She will receive her third of 12 doses today. She has not yet started to lose any hair and she might be able to keep it.  I gave her information on leptospirosis. In general her dog needs to be quarantine until it is either diagnosed as not having leptospirosis or is treated and is no longer shedding.  Chiara is going to call me if she develops any fever or rash.  We also discussed diet issues and multivitamins. In general I discouraged megadoses of vitamins during chemotherapy since there is data that very high doses of vitamins can "rescue" cancer cells from chemotherapy. Ordinary doses of vitamins are fine.  Otherwise she will continue to see [place] on a weekly basis chiefly to make sure she does not develop neuropathy. Of course we will troubleshoot any other problems that may develop  [name], MD   05/13/2015 12:25 PM Medical Oncology and Hematology [facility] [address]  [place], [place] [address] Tel. [phone]    Fax. [phone]

## 2015-05-20 ENCOUNTER — Ambulatory Visit (HOSPITAL_BASED_OUTPATIENT_CLINIC_OR_DEPARTMENT_OTHER): Payer: Medicare Other

## 2015-05-20 ENCOUNTER — Encounter: Payer: Self-pay | Admitting: *Deleted

## 2015-05-20 ENCOUNTER — Other Ambulatory Visit (HOSPITAL_BASED_OUTPATIENT_CLINIC_OR_DEPARTMENT_OTHER): Payer: Medicare Other

## 2015-05-20 ENCOUNTER — Encounter: Payer: Self-pay | Admitting: Nurse Practitioner

## 2015-05-20 ENCOUNTER — Telehealth: Payer: Self-pay | Admitting: Oncology

## 2015-05-20 ENCOUNTER — Ambulatory Visit (HOSPITAL_BASED_OUTPATIENT_CLINIC_OR_DEPARTMENT_OTHER): Payer: Medicare Other | Admitting: Nurse Practitioner

## 2015-05-20 VITALS — BP 126/80 | HR 90 | Temp 98.4°F | Resp 18 | Ht 63.2 in | Wt 172.3 lb

## 2015-05-20 DIAGNOSIS — C50412 Malignant neoplasm of upper-outer quadrant of left female breast: Secondary | ICD-10-CM

## 2015-05-20 DIAGNOSIS — C50511 Malignant neoplasm of lower-outer quadrant of right female breast: Secondary | ICD-10-CM

## 2015-05-20 DIAGNOSIS — Z5112 Encounter for antineoplastic immunotherapy: Secondary | ICD-10-CM | POA: Diagnosis present

## 2015-05-20 DIAGNOSIS — M8580 Other specified disorders of bone density and structure, unspecified site: Secondary | ICD-10-CM | POA: Diagnosis not present

## 2015-05-20 DIAGNOSIS — Z5111 Encounter for antineoplastic chemotherapy: Secondary | ICD-10-CM

## 2015-05-20 LAB — CBC WITH DIFFERENTIAL/PLATELET
BASO%: 1.8 % (ref 0.0–2.0)
Basophils Absolute: 0.1 10*3/uL (ref 0.0–0.1)
EOS%: 6 % (ref 0.0–7.0)
Eosinophils Absolute: 0.2 10*3/uL (ref 0.0–0.5)
HCT: 37.2 % (ref 34.8–46.6)
HEMOGLOBIN: 12.4 g/dL (ref 11.6–15.9)
LYMPH%: 37.9 % (ref 14.0–49.7)
MCH: 29.5 pg (ref 25.1–34.0)
MCHC: 33.3 g/dL (ref 31.5–36.0)
MCV: 88.6 fL (ref 79.5–101.0)
MONO#: 0.3 10*3/uL (ref 0.1–0.9)
MONO%: 9.9 % (ref 0.0–14.0)
NEUT%: 44.4 % (ref 38.4–76.8)
NEUTROS ABS: 1.5 10*3/uL (ref 1.5–6.5)
PLATELETS: 234 10*3/uL (ref 145–400)
RBC: 4.2 10*6/uL (ref 3.70–5.45)
RDW: 14.2 % (ref 11.2–14.5)
WBC: 3.4 10*3/uL — AB (ref 3.9–10.3)
lymph#: 1.3 10*3/uL (ref 0.9–3.3)

## 2015-05-20 MED ORDER — SODIUM CHLORIDE 0.9% FLUSH
10.0000 mL | INTRAVENOUS | Status: DC | PRN
  Administered 2015-05-20: 10 mL
  Filled 2015-05-20: qty 10

## 2015-05-20 MED ORDER — DIPHENHYDRAMINE HCL 50 MG/ML IJ SOLN
INTRAMUSCULAR | Status: AC
  Filled 2015-05-20: qty 1

## 2015-05-20 MED ORDER — SODIUM CHLORIDE 0.9 % IV SOLN
80.0000 mg/m2 | Freq: Once | INTRAVENOUS | Status: AC
  Administered 2015-05-20: 150 mg via INTRAVENOUS
  Filled 2015-05-20: qty 25

## 2015-05-20 MED ORDER — SODIUM CHLORIDE 0.9 % IV SOLN
Freq: Once | INTRAVENOUS | Status: AC
  Administered 2015-05-20: 12:00:00 via INTRAVENOUS

## 2015-05-20 MED ORDER — ACETAMINOPHEN 325 MG PO TABS
650.0000 mg | ORAL_TABLET | Freq: Once | ORAL | Status: AC
Start: 2015-05-20 — End: 2015-05-20
  Administered 2015-05-20: 650 mg via ORAL

## 2015-05-20 MED ORDER — SODIUM CHLORIDE 0.9 % IV SOLN
4.0000 mg | Freq: Once | INTRAVENOUS | Status: AC
  Administered 2015-05-20: 4 mg via INTRAVENOUS
  Filled 2015-05-20: qty 0.4

## 2015-05-20 MED ORDER — ACETAMINOPHEN 325 MG PO TABS
ORAL_TABLET | ORAL | Status: AC
  Filled 2015-05-20: qty 2

## 2015-05-20 MED ORDER — FAMOTIDINE IN NACL 20-0.9 MG/50ML-% IV SOLN
20.0000 mg | Freq: Once | INTRAVENOUS | Status: AC
  Administered 2015-05-20: 20 mg via INTRAVENOUS

## 2015-05-20 MED ORDER — FAMOTIDINE IN NACL 20-0.9 MG/50ML-% IV SOLN
INTRAVENOUS | Status: AC
  Filled 2015-05-20: qty 50

## 2015-05-20 MED ORDER — DIPHENHYDRAMINE HCL 50 MG/ML IJ SOLN
25.0000 mg | Freq: Once | INTRAMUSCULAR | Status: AC
  Administered 2015-05-20: 25 mg via INTRAVENOUS

## 2015-05-20 MED ORDER — HEPARIN SOD (PORK) LOCK FLUSH 100 UNIT/ML IV SOLN
500.0000 [IU] | Freq: Once | INTRAVENOUS | Status: AC | PRN
  Administered 2015-05-20: 500 [IU]
  Filled 2015-05-20: qty 5

## 2015-05-20 MED ORDER — TRASTUZUMAB CHEMO INJECTION 440 MG
2.0000 mg/kg | Freq: Once | INTRAVENOUS | Status: AC
  Administered 2015-05-20: 168 mg via INTRAVENOUS
  Filled 2015-05-20: qty 8

## 2015-05-20 NOTE — Patient Instructions (Signed)
[facility] Discharge Instructions for Patients Receiving Chemotherapy  Today you received the following chemotherapy agents:  Taxol and Herceptin  To help prevent nausea and vomiting after your treatment, we encourage you to take your nausea medication as ordered per MD.   If you develop nausea and vomiting that is not controlled by your nausea medication, call the clinic.   BELOW ARE SYMPTOMS THAT SHOULD BE REPORTED IMMEDIATELY:  *FEVER GREATER THAN 100.5 F  *CHILLS WITH OR WITHOUT FEVER  NAUSEA AND VOMITING THAT IS NOT CONTROLLED WITH YOUR NAUSEA MEDICATION  *UNUSUAL SHORTNESS OF BREATH  *UNUSUAL BRUISING OR BLEEDING  TENDERNESS IN MOUTH AND THROAT WITH OR WITHOUT PRESENCE OF ULCERS  *URINARY PROBLEMS  *BOWEL PROBLEMS  UNUSUAL RASH Items with * indicate a potential emergency and should be followed up as soon as possible.  Feel free to call the clinic you have any questions or concerns. The clinic phone number is [phone].  Please show the [facility] at check-in to the Emergency Department and triage nurse.

## 2015-05-20 NOTE — Progress Notes (Signed)
[place]  [phone] [phone]   ID: Isabella Cook DOB: 1940/03/24  MR#: [id number]  BTC#:[id number]  Patient Care Team: [name], FNP as PCP - General (Family Medicine) [name], MD as Consulting Physician (Oncology) [name], MD as Consulting Physician (General Surgery) [name], NP as Nurse Practitioner (Hematology and Oncology) [name], MD as Consulting Physician (Oncology) Thea Silversmith, MD as Consulting Physician (Radiation Oncology) [name], MD as Consulting Physician (Neurosurgery) [name], MD as Referring Physician (Otolaryngology) [name], MD as Consulting Physician (Cardiology) PCP: [name], FNP OTHER MD:  CHIEF COMPLAINT: HER-2 positive breast cancer, bilateral beast cancers  CURRENT TREATMENT: Paclitaxel, trastuzumab  BREAST CANCER HISTORY:   From the original intake note:  Isabella Cook had routine screening mammography at [facility] suggesting an area of indeterminate microcalcifications in the upper outer quadrant of the left breast measuring 8 mm. She was referred for left breast stereotactic biopsy performed at the [facility] 02/27/2015. This showed ([id number]) invasive ductal carcinoma, grade 2, estrogen receptor 100% positive, progesterone receptor 100% positive, both with strong staining intensity, with an MIB-1 of 20%, and HER-2 amplified with a signals ratio of 2.53, the number per cell being 2.65. 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  On 03/25/2015 the patient underwent bilateral breast MRI, which showed the breast density to be category B. this showed, in the left breast, an area of non-masslike enhancement measuring 7.8 cm. Associated with this were 2 adjacent irregular masses measuring 1.7 and 1.4 cm. The masses were 5 cm anterior and superior to the biopsy clip. There was a cortically thickened left axillary lymph node  noted.  Also in the right breast there was a 7 mm irregular enhancing mass at the 6:30 o'clock position.  Her subsequent history is as detailed below    INTERVAL HISTORY: Isabella Cook returns today for follow-up of her left-sided breast cancer, accompanied by her husband Isabella Cook. Today is day 1, cycle 4 of 12 planned weekly doses of paclitaxel and trastuzumab.  REVIEW OF SYSTEMS: Ranika has few complaints today. Her hair has started to thin, so she did go ahead an purchase a wig. She denies fevers, chills, nausea, or vomiting. She takes pepcid daily for indigestion. She has mild diarrhea that was managed well with imodium. She denies mouth sores, rashes, or neuropath symptoms. She has decent energy during the day and sleeps well at night. She was recently prescribed a gentamicin/mupirocin nasal spray for a MARCoNS infection. A detailed review of systems is otherwise stable.  PAST MEDICAL HISTORY: Past Medical History  Diagnosis Date  . GERD (gastroesophageal reflux disease)   . Arthritis     wrists and knees  . Cancer ([place]) 03-2015    left breast  . Multiple food allergies   . Urinary tract infection   . History of hiatal hernia   . History of jaundice as a child     PAST SURGICAL HISTORY: Past Surgical History  Procedure Laterality Date  . Abdominal hysterectomy    . Cholecystectomy    . Appendectomy    . Tubal ligation    . Back surgery      lumbar  . Hernia repair      UHR  . Cervical disc surgery  2016  . Portacath placement Right 04/22/2015    Procedure: INSERTION PORT-A-CATH WITH [place];  Surgeon: [name], MD;  Location: [place];  Service: General;  Laterality: Right;    FAMILY HISTORY No  family history on file. The patient's father had a history of pseudo-bulbar pulse 54 and died from a stroke at age 23. The patient's mother died at age 41 from complications of emphysema. The patient had no brothers, 2 sisters. There is no history of cancer in the family and specifically no  history of breast or ovarian cancer.  GYNECOLOGIC HISTORY:  No LMP recorded. Patient has had a hysterectomy. Menarche age 75, first live birth age 53. She is GX P2. She underwent a hysterectomy with left salpingo-oophorectomy in 1984. She received hormone replacement for approximately 15 years, until 2005.  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 Her husband Isabella Cook is a retired Social research officer, government. Daughter Isabella Cook lives in [place] where she is a housewife, homeschooling her children. Son Isabella Cook lives in [place] and works in Chartered certified accountant. The patient has 2 grandchildren. She is not a church attender    ADVANCED DIRECTIVES: In place   HEALTH MAINTENANCE: Social History  Substance Use Topics  . Smoking status: Never Smoker   . Smokeless tobacco: Not on file  . Alcohol Use: Yes     Comment: occ 1 every 2-3 months     Colonoscopy:2014  PAP:  Bone density: 2017 [facility]//osteopenia   Lipid panel:  Allergies  Allergen Reactions  . Dairy Aid [Lactase] Anaphylaxis     Takes claritin every day and benadryl, if needed, to prevent anaphylaxis   . Eggs Or Egg-Derived Products Anaphylaxis    Nasal stuffiness, Takes claritin every day and benadryl, if needed, to prevent anaphylaxis  . Gelatin (Bovine) [Beef Extract] Anaphylaxis    Muscle pain,  Takes claritin every day and benadryl, if needed, to prevent anaphylaxis   . Gluten Meal Anaphylaxis and Diarrhea    Takes claritin every day and benadryl, if needed, to prevent anaphylaxis   . Lambs Quarters Anaphylaxis    ALL "mammal" MEAT per pt -- Muscle pain  Can eat chicken, fish, [place]  . Milk-Related Compounds Anaphylaxis    Nasal stuffiness Cheese (mozzarella, swiss, cheddar, cottage)  . Pork Allergy Anaphylaxis    ALL "mammal" MEAT per pt -- Muscle pain  Can eat chicken, fish, [place]  . Wheat Bran Anaphylaxis and Diarrhea      Takes claritin every day and benadryl, if needed, to prevent anaphylaxis   . Whey Anaphylaxis and Diarrhea     Takes claritin every day and benadryl, if needed, to prevent anaphylaxis   . Yeast Anaphylaxis     Takes claritin every day and benadryl, if needed, to prevent anaphylaxis  . Soy Allergy Diarrhea  . Almond Oil Nausea And Vomiting    Current Outpatient Prescriptions  Medication Sig Dispense Refill  . amoxicillin (AMOXIL) 250 MG capsule Take 250 mg by mouth 2 (two) times daily.  0  . calcium carbonate (TUMS - DOSED IN MG ELEMENTAL CALCIUM) 500 MG chewable tablet Chew 500 mg by mouth daily as needed for indigestion or heartburn.     . famotidine (PEPCID) 20 MG tablet Take 20 mg by mouth daily.     Marland Kitchen lidocaine-prilocaine (EMLA) cream Apply to affected area once 30 g 3  . loratadine (CLARITIN) 10 MG tablet Take 10 mg by mouth daily.    Marland Kitchen LORazepam (ATIVAN) 0.5 MG tablet Take 1 tablet (0.5 mg total) by mouth at bedtime as needed (Nausea or vomiting). 15 tablet 0  . Magnesium Citrate 100 MG TABS Take  200 mg by mouth daily.    . Methylcellulose, Laxative, (CITRUCEL) 500 MG TABS Take 2,000 mg by mouth 2 (two) times daily.    . naproxen sodium (ANAPROX) 220 MG tablet Take 440 mg by mouth 3 (three) times daily with meals.    . ondansetron (ZOFRAN) 8 MG tablet Take 1 tablet (8 mg total) by mouth 2 (two) times daily as needed (Nausea or vomiting). 30 tablet 1  . Polyethyl Glycol-Propyl Glycol (SYSTANE ULTRA) 0.4-0.3 % SOLN Place 1 drop into both eyes daily as needed (dry eyes).    . Probiotic Product (ALIGN PO) Take 1 tablet by mouth daily.     Marland Kitchen UNABLE TO FIND Place 2 Squirts into the nose 4 (four) times daily. Gentamicin 0.025% Mupirocin 0.2%    . VITAMIN D-VITAMIN K PO Take 1 capsule by mouth daily. Reported on 04/15/2015    . diphenhydrAMINE (SOMINEX) 25 MG tablet Take 25 mg by mouth at bedtime as needed. Reported on 05/20/2015    . prochlorperazine (COMPAZINE) 10 MG tablet Take 1 tablet  (10 mg total) by mouth every 6 (six) hours as needed (Nausea or vomiting). (Patient not taking: Reported on 05/20/2015) 30 tablet 1   No current facility-administered medications for this visit.    OBJECTIVE: Middle-aged white woman who appears well Filed Vitals:   05/20/15 1032  BP: 126/80  Pulse: 90  Temp: 98.4 F (36.9 C)  Resp: 18     Body mass index is 30.34 kg/(m^2).    ECOG FS:0 - Asymptomatic  Skin: warm, dry  HEENT: sclerae anicteric, conjunctivae pink, oropharynx clear. No thrush or mucositis.  Lymph Nodes: No cervical or supraclavicular lymphadenopathy  Lungs: clear to auscultation bilaterally, no rales, wheezes, or rhonci  Heart: regular rate and rhythm  Abdomen: round, soft, non tender, positive bowel sounds  Musculoskeletal: No focal spinal tenderness, no peripheral edema  Neuro: non focal, well oriented, positive affect  Breasts: deferred   LAB RESULTS:  CMP     Component Value Date/Time   NA 142 05/13/2015 1148   K 4.1 05/13/2015 1148   CO2 26 05/13/2015 1148   GLUCOSE 110 05/13/2015 1148   BUN 12.1 05/13/2015 1148   CREATININE 1.0 05/13/2015 1148   CALCIUM 10.2 05/13/2015 1148   PROT 6.9 05/13/2015 1148   ALBUMIN 3.8 05/13/2015 1148   AST 14 05/13/2015 1148   ALT 19 05/13/2015 1148   ALKPHOS 54 05/13/2015 1148   BILITOT 0.30 05/13/2015 1148    INo results found for: SPEP, UPEP  Lab Results  Component Value Date   WBC 3.4* 05/20/2015   NEUTROABS 1.5 05/20/2015   HGB 12.4 05/20/2015   HCT 37.2 05/20/2015   MCV 88.6 05/20/2015   PLT 234 05/20/2015      Chemistry      Component Value Date/Time   NA 142 05/13/2015 1148   K 4.1 05/13/2015 1148   CO2 26 05/13/2015 1148   BUN 12.1 05/13/2015 1148   CREATININE 1.0 05/13/2015 1148      Component Value Date/Time   CALCIUM 10.2 05/13/2015 1148   ALKPHOS 54 05/13/2015 1148   AST 14 05/13/2015 1148   ALT 19 05/13/2015 1148   BILITOT 0.30 05/13/2015 1148       No results found for:  LABCA2  No components found for: LABCA125  No results for input(s): INR in the last 168 hours.  Urinalysis No results found for: COLORURINE, APPEARANCEUR, [place], [place], [place], [place], [place], [place], [place], [place], NITRITE, LEUKOCYTESUR  ELIGIBLE FOR AVAILABLE RESEARCH PROTOCOL: no  STUDIES: Dg Chest Port 1 View  04/22/2015  CLINICAL DATA:  Status post right Port-A-Cath placement today. Initial encounter. EXAM: PORTABLE CHEST 1 VIEW COMPARISON:  None. FINDINGS: Right IJ approach Port-A-Cath is in place with the tip projecting at the mid to lower superior vena cava. No pneumothorax is identified. Mild left basilar atelectasis is seen. The lungs are otherwise clear. Heart size is normal. No focal bony abnormality. IMPRESSION: Tip of Port-A-Cath projects at the mid to lower superior vena cava. Exact position can be confirmed with a lateral view. Negative for pneumothorax. Electronically Signed   By: Inge Rise M.D.   On: 04/22/2015 15:03   Dg Fluoro Guide Cv Line-no Report  04/22/2015  CLINICAL DATA:  FLOURO GUIDE CV LINE Fluoroscopy was utilized by the requesting physician.  No radiographic interpretation.    ASSESSMENT: 75 y.o. [facility] woman  status post left breast biopsy 02/27/2015 for a clinical mT1-3 N1 invasive ductal carcinoma, grade 2, strongly estrogen and progesterone receptor positive, HER-2 amplified, with an MIB-1 of 20%.   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  (b) right breast lower outer quadrant: a clinical T1b N0, stage IA invasive ductal carcinoma, estrogen and  progesterone receptor positive, HER-2 not amplified, with an Mib-1 of 4%  (2)  neoadjuvant chemotherapy consisting of weekly paclitaxel 12 together with weekly trastuzumab 12, started 04/29/2015  (3) trastuzumab will be continued every 3 weeks for an additional 9 months after completion of chemotherapy  (a) echo 04/25/2015 shows an ejection fraction of 55-60%   (4) definitive surgery to follow chemotherapy  (5) adjuvant radiation therapy to follow surgery  (6) tamoxifen to start at the completion of local treatment  (a) the patient has been asked to discontinue Estring as of 05/05/2015, to be resumed when tamoxifen is started   PLAN: Jose continues to tolerate treatment remarkably well. The labs were reviewed in detail and were entirely stable. She will proceed with cycle 4 of paclitaxel and trastuzumab as planned today.   Caelie will return in 1 week for cycle 5 of treatment, she understands and agrees with this plan. She knows the goal of treatment in her case is cure. She has been encouraged to call with any issues that might arise before her next visit here.   Laurie Panda, NP   05/20/2015 11:14 AM

## 2015-05-20 NOTE — Telephone Encounter (Signed)
Added appt

## 2015-05-27 ENCOUNTER — Other Ambulatory Visit (HOSPITAL_BASED_OUTPATIENT_CLINIC_OR_DEPARTMENT_OTHER): Payer: Medicare Other

## 2015-05-27 ENCOUNTER — Ambulatory Visit (HOSPITAL_BASED_OUTPATIENT_CLINIC_OR_DEPARTMENT_OTHER): Payer: Medicare Other

## 2015-05-27 ENCOUNTER — Encounter: Payer: Self-pay | Admitting: Nurse Practitioner

## 2015-05-27 ENCOUNTER — Ambulatory Visit (HOSPITAL_BASED_OUTPATIENT_CLINIC_OR_DEPARTMENT_OTHER): Payer: Medicare Other | Admitting: Nurse Practitioner

## 2015-05-27 VITALS — BP 134/76 | HR 90 | Temp 98.4°F | Resp 18 | Ht 63.2 in | Wt 174.3 lb

## 2015-05-27 DIAGNOSIS — M8580 Other specified disorders of bone density and structure, unspecified site: Secondary | ICD-10-CM | POA: Diagnosis not present

## 2015-05-27 DIAGNOSIS — C50412 Malignant neoplasm of upper-outer quadrant of left female breast: Secondary | ICD-10-CM

## 2015-05-27 DIAGNOSIS — T451X5A Adverse effect of antineoplastic and immunosuppressive drugs, initial encounter: Secondary | ICD-10-CM | POA: Insufficient documentation

## 2015-05-27 DIAGNOSIS — R200 Anesthesia of skin: Secondary | ICD-10-CM | POA: Diagnosis not present

## 2015-05-27 DIAGNOSIS — C50511 Malignant neoplasm of lower-outer quadrant of right female breast: Secondary | ICD-10-CM

## 2015-05-27 DIAGNOSIS — G620 Drug-induced polyneuropathy: Secondary | ICD-10-CM

## 2015-05-27 DIAGNOSIS — R197 Diarrhea, unspecified: Secondary | ICD-10-CM | POA: Diagnosis not present

## 2015-05-27 DIAGNOSIS — Z5111 Encounter for antineoplastic chemotherapy: Secondary | ICD-10-CM

## 2015-05-27 LAB — CBC WITH DIFFERENTIAL/PLATELET
BASO%: 1.2 % (ref 0.0–2.0)
Basophils Absolute: 0 10*3/uL (ref 0.0–0.1)
EOS%: 8.9 % — AB (ref 0.0–7.0)
Eosinophils Absolute: 0.3 10*3/uL (ref 0.0–0.5)
HEMATOCRIT: 36.6 % (ref 34.8–46.6)
HGB: 12.3 g/dL (ref 11.6–15.9)
LYMPH#: 1.2 10*3/uL (ref 0.9–3.3)
LYMPH%: 34.1 % (ref 14.0–49.7)
MCH: 29.8 pg (ref 25.1–34.0)
MCHC: 33.5 g/dL (ref 31.5–36.0)
MCV: 88.8 fL (ref 79.5–101.0)
MONO#: 0.2 10*3/uL (ref 0.1–0.9)
MONO%: 6.3 % (ref 0.0–14.0)
NEUT%: 49.5 % (ref 38.4–76.8)
NEUTROS ABS: 1.7 10*3/uL (ref 1.5–6.5)
Platelets: 232 10*3/uL (ref 145–400)
RBC: 4.12 10*6/uL (ref 3.70–5.45)
RDW: 14.4 % (ref 11.2–14.5)
WBC: 3.5 10*3/uL — AB (ref 3.9–10.3)

## 2015-05-27 MED ORDER — DIPHENHYDRAMINE HCL 50 MG/ML IJ SOLN
INTRAMUSCULAR | Status: AC
  Filled 2015-05-27: qty 1

## 2015-05-27 MED ORDER — ACETAMINOPHEN 325 MG PO TABS
650.0000 mg | ORAL_TABLET | Freq: Once | ORAL | Status: AC
  Administered 2015-05-27: 650 mg via ORAL

## 2015-05-27 MED ORDER — TRASTUZUMAB CHEMO INJECTION 440 MG
2.0000 mg/kg | Freq: Once | INTRAVENOUS | Status: AC
  Administered 2015-05-27: 168 mg via INTRAVENOUS
  Filled 2015-05-27: qty 8

## 2015-05-27 MED ORDER — SODIUM CHLORIDE 0.9 % IV SOLN
Freq: Once | INTRAVENOUS | Status: AC
  Administered 2015-05-27: 13:00:00 via INTRAVENOUS

## 2015-05-27 MED ORDER — SODIUM CHLORIDE 0.9% FLUSH
10.0000 mL | INTRAVENOUS | Status: DC | PRN
  Filled 2015-05-27: qty 10

## 2015-05-27 MED ORDER — DIPHENHYDRAMINE HCL 50 MG/ML IJ SOLN
25.0000 mg | Freq: Once | INTRAMUSCULAR | Status: AC
  Administered 2015-05-27: 25 mg via INTRAVENOUS

## 2015-05-27 MED ORDER — ACETAMINOPHEN 325 MG PO TABS
ORAL_TABLET | ORAL | Status: AC
  Filled 2015-05-27: qty 2

## 2015-05-27 NOTE — Progress Notes (Signed)
[place]  [phone]) [phone] [phone]   ID: Khyli Swaim DOB: 1940/07/18  MR#: [id number]  NTI#:[id number]  Patient Care Team: [name], FNP as PCP - General (Family Medicine) [name], MD as Consulting Physician (Oncology) [name], MD as Consulting Physician (General Surgery) [name], NP as Nurse Practitioner (Hematology and Oncology) [name], MD as Consulting Physician (Oncology) Thea Silversmith, MD as Consulting Physician (Radiation Oncology) [name], MD as Consulting Physician (Neurosurgery) [name], MD as Referring Physician (Otolaryngology) [name], MD as Consulting Physician (Cardiology) PCP: [name], FNP OTHER MD:  CHIEF COMPLAINT: HER-2 positive breast cancer, bilateral beast cancers  CURRENT TREATMENT: Paclitaxel, trastuzumab  BREAST CANCER HISTORY:   From the original intake note:  Jazmine had routine screening mammography at [facility] suggesting an area of indeterminate microcalcifications in the upper outer quadrant of the left breast measuring 8 mm. She was referred for left breast stereotactic biopsy performed at the [facility] 02/27/2015. This showed ([id number]) invasive ductal carcinoma, grade 2, estrogen receptor 100% positive, progesterone receptor 100% positive, both with strong staining intensity, with an MIB-1 of 20%, and HER-2 amplified with a signals ratio of 2.53, the number per cell being 2.65. 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  On 03/25/2015 the patient underwent bilateral breast MRI, which showed the breast density to be category B. this showed, in the left breast, an area of non-masslike enhancement measuring 7.8 cm. Associated with this were 2 adjacent irregular masses measuring 1.7 and 1.4 cm. The masses were 5 cm anterior and superior to the biopsy clip. There was a cortically thickened left axillary lymph node  noted.  Also in the right breast there was a 7 mm irregular enhancing mass at the 6:30 o'clock position.  Her subsequent history is as detailed below    INTERVAL HISTORY: Magdalina returns today for follow-up of her left-sided breast cancer, accompanied by her husband [name]. Today is day 1, cycle 5 of 12 planned weekly doses of paclitaxel and trastuzumab. The interval history is remarkable for being informed that her well water supply tested positive for a coliform bacterial colony. This bacteria is commonly found in fecal matter and can cause a host of GI symptoms. The patient has diarrhea, but no more than usual and its controlled by imodium, so this is likely chemotherapy related. She continues on amoxicillin BID as treatment for a previous tick bite.   REVIEW OF SYSTEMS: New this week is numbness to her bilateral toes and the balls of her feet. This started 2 days ago and is fairly consistent. She can still walk and wear sandals without issues. She denies fevers, chills, nausea, or vomiting. She continues on pepcid daily PRN for indigestion. She denies mouth sores or rashes. She has fair energy during the day and sleeps well at night. A detailed review of systems is otherwise stable.  PAST MEDICAL HISTORY: Past Medical History  Diagnosis Date  . GERD (gastroesophageal reflux disease)   . Arthritis     wrists and knees  . Cancer ([facility]) 03-2015    left breast  . Multiple food allergies   . Urinary tract infection   . History of hiatal hernia   . History of jaundice as a child     PAST SURGICAL HISTORY: Past Surgical History  Procedure Laterality Date  . Abdominal hysterectomy    . Cholecystectomy    . Appendectomy    . Tubal ligation    .  Back surgery      lumbar  . Hernia repair      UHR  . Cervical disc surgery  2016  . Portacath placement Right 04/22/2015    Procedure: INSERTION PORT-A-CATH WITH [place];  Surgeon: [name], MD;  Location: [facility];  Service: General;  Laterality:  Right;    FAMILY HISTORY No family history on file. The patient's father had a history of pseudo-bulbar pulse 3 and died from a stroke at age 72. The patient's mother died at age 30 from complications of emphysema. The patient had no brothers, 2 sisters. There is no history of cancer in the family and specifically no history of breast or ovarian cancer.  GYNECOLOGIC HISTORY:  No LMP recorded. Patient has had a hysterectomy. Menarche age 71, first live birth age 83. She is GX P2. She underwent a hysterectomy with left salpingo-oophorectomy in 1984. She received hormone replacement for approximately 15 years, until 2005.  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 Her husband [name] is a retired Social research officer, government. Daughter [name] lives in [place] where she is a housewife, homeschooling her children. Son Gisselle Galvis lives in [place] and works in Chartered certified accountant. The patient has 2 grandchildren. She is not a church attender    ADVANCED DIRECTIVES: In place   HEALTH MAINTENANCE: Social History  Substance Use Topics  . Smoking status: Never Smoker   . Smokeless tobacco: Not on file  . Alcohol Use: Yes     Comment: occ 1 every 2-3 months     Colonoscopy:2014  PAP:  Bone density: 2017 [facility]//osteopenia   Lipid panel:  Allergies  Allergen Reactions  . Dairy Aid [Lactase] Anaphylaxis     Takes claritin every day and benadryl, if needed, to prevent anaphylaxis   . Eggs Or Egg-Derived Products Anaphylaxis    Nasal stuffiness, Takes claritin every day and benadryl, if needed, to prevent anaphylaxis  . Gelatin (Bovine) [Beef Extract] Anaphylaxis    Muscle pain,  Takes claritin every day and benadryl, if needed, to prevent anaphylaxis   . Gluten Meal Anaphylaxis and Diarrhea    Takes claritin every day and benadryl, if needed, to prevent anaphylaxis   . Lambs Quarters Anaphylaxis     ALL "mammal" MEAT per pt -- Muscle pain  Can eat chicken, fish, [place]  . Milk-Related Compounds Anaphylaxis    Nasal stuffiness Cheese (mozzarella, swiss, cheddar, cottage)  . Pork Allergy Anaphylaxis    ALL "mammal" MEAT per pt -- Muscle pain  Can eat chicken, fish, [place]  . Wheat Bran Anaphylaxis and Diarrhea     Takes claritin every day and benadryl, if needed, to prevent anaphylaxis   . Whey Anaphylaxis and Diarrhea     Takes claritin every day and benadryl, if needed, to prevent anaphylaxis   . Yeast Anaphylaxis     Takes claritin every day and benadryl, if needed, to prevent anaphylaxis  . Soy Allergy Diarrhea  . Almond Oil Nausea And Vomiting    Current Outpatient Prescriptions  Medication Sig Dispense Refill  . amoxicillin (AMOXIL) 250 MG capsule Take 250 mg by mouth 2 (two) times daily.  0  . calcium carbonate (TUMS - DOSED IN MG ELEMENTAL CALCIUM) 500 MG chewable tablet Chew 500 mg by mouth daily as needed for indigestion or heartburn.     . diphenhydrAMINE (SOMINEX) 25 MG tablet Take 25 mg by mouth at bedtime as needed. Reported  on 05/20/2015    . famotidine (PEPCID) 20 MG tablet Take 20 mg by mouth daily.     Marland Kitchen lidocaine-prilocaine (EMLA) cream Apply to affected area once 30 g 3  . loratadine (CLARITIN) 10 MG tablet Take 10 mg by mouth daily.    Marland Kitchen LORazepam (ATIVAN) 0.5 MG tablet Take 1 tablet (0.5 mg total) by mouth at bedtime as needed (Nausea or vomiting). 15 tablet 0  . Magnesium Citrate 100 MG TABS Take 200 mg by mouth daily.    . Methylcellulose, Laxative, (CITRUCEL) 500 MG TABS Take 2,000 mg by mouth 2 (two) times daily.    . naproxen sodium (ANAPROX) 220 MG tablet Take 440 mg by mouth 3 (three) times daily with meals.    . ondansetron (ZOFRAN) 8 MG tablet Take 1 tablet (8 mg total) by mouth 2 (two) times daily as needed (Nausea or vomiting). 30 tablet 1  . Polyethyl Glycol-Propyl Glycol (SYSTANE ULTRA) 0.4-0.3 % SOLN Place 1 drop into both eyes daily as needed (dry  eyes).    . Probiotic Product (ALIGN PO) Take 1 tablet by mouth daily.     . prochlorperazine (COMPAZINE) 10 MG tablet Take 1 tablet (10 mg total) by mouth every 6 (six) hours as needed (Nausea or vomiting). (Patient not taking: Reported on 05/20/2015) 30 tablet 1  . UNABLE TO FIND Place 2 Squirts into the nose 4 (four) times daily. Gentamicin 0.025% Mupirocin 0.2%    . VITAMIN D-VITAMIN K PO Take 1 capsule by mouth daily. Reported on 04/15/2015     No current facility-administered medications for this visit.    OBJECTIVE: Middle-aged white Cook who appears well Filed Vitals:   05/27/15 1046  BP: 134/76  Pulse: 90  Temp: 98.4 F (36.9 C)  Resp: 18     Body mass index is 30.69 kg/(m^2).    ECOG FS:0 - Asymptomatic  Skin: warm, dry  HEENT: sclerae anicteric, conjunctivae pink, oropharynx clear. No thrush or mucositis.  Lymph Nodes: No cervical or supraclavicular lymphadenopathy  Lungs: clear to auscultation bilaterally, no rales, wheezes, or rhonci  Heart: regular rate and rhythm  Abdomen: round, soft, non tender, positive bowel sounds  Musculoskeletal: No focal spinal tenderness, no peripheral edema  Neuro: non focal, well oriented, positive affect  Breasts: deferred   LAB RESULTS:  CMP     Component Value Date/Time   NA 142 05/13/2015 1148   K 4.1 05/13/2015 1148   CO2 26 05/13/2015 1148   GLUCOSE 110 05/13/2015 1148   BUN 12.1 05/13/2015 1148   CREATININE 1.0 05/13/2015 1148   CALCIUM 10.2 05/13/2015 1148   PROT 6.9 05/13/2015 1148   ALBUMIN 3.8 05/13/2015 1148   AST 14 05/13/2015 1148   ALT 19 05/13/2015 1148   ALKPHOS 54 05/13/2015 1148   BILITOT 0.30 05/13/2015 1148    INo results found for: SPEP, UPEP  Lab Results  Component Value Date   WBC 3.5* 05/27/2015   NEUTROABS 1.7 05/27/2015   HGB 12.3 05/27/2015   HCT 36.6 05/27/2015   MCV 88.8 05/27/2015   PLT 232 05/27/2015      Chemistry      Component Value Date/Time   NA 142 05/13/2015 1148   K 4.1  05/13/2015 1148   CO2 26 05/13/2015 1148   BUN 12.1 05/13/2015 1148   CREATININE 1.0 05/13/2015 1148      Component Value Date/Time   CALCIUM 10.2 05/13/2015 1148   ALKPHOS 54 05/13/2015 1148   AST 14 05/13/2015 1148  ALT 19 05/13/2015 1148   BILITOT 0.30 05/13/2015 1148       No results found for: LABCA2  No components found for: LABCA125  No results for input(s): INR in the last 168 hours.  Urinalysis No results found for: COLORURINE, APPEARANCEUR, LABSPEC, PHURINE, GLUCOSEU, HGBUR, BILIRUBINUR, KETONESUR, PROTEINUR, UROBILINOGEN, NITRITE, LEUKOCYTESUR   ELIGIBLE FOR AVAILABLE RESEARCH PROTOCOL: no  STUDIES: No results found.  ASSESSMENT: 75 y.o. Isabella Cook  status post left breast biopsy 02/27/2015 for a clinical mT1-3 N1 invasive ductal carcinoma, grade 2, strongly estrogen and progesterone receptor positive, HER-2 amplified, with an MIB-1 of 20%.   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  (b) right breast lower outer quadrant: a clinical T1b N0, stage IA invasive ductal carcinoma, estrogen and progesterone receptor positive, HER-2 not amplified, with an Mib-1 of 4%  (2)  neoadjuvant chemotherapy consisting of weekly paclitaxel 12 together with weekly trastuzumab 12, started 04/29/2015  (3) trastuzumab will be continued every 3 weeks for an additional 9 months after completion of chemotherapy  (a) echo 04/25/2015 shows an ejection fraction of 55-60%   (4) definitive surgery to follow chemotherapy  (5) adjuvant radiation therapy to follow surgery  (6) tamoxifen to start at the  completion of local treatment  (a) the patient has been asked to discontinue Estring as of 05/05/2015, to be resumed when tamoxifen is started   PLAN: I reviewed Chloris's neuropathy symptoms with Dr. [name]. He suggests holding the paclitaxel this week, and proceeding with trastuzumab alone. If she has improved we may be able to restart the paclitaxel. If not, she may be switched to gemcitabine and carboplatin.   As far as the coliform contamination to her water supply, as long as she doe not have overt GI symptoms, she does not require treatment.  Bedie will return in 1 week for follow up with Dr. [name]. 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 She understands and agrees with this plan. She knows the goal of treatment in her case is cure. She has been encouraged to call with any issues that might arise before her next visit here.   Laurie Panda, NP   05/27/2015 1:10 PM

## 2015-05-27 NOTE — Patient Instructions (Signed)
Cancer Center Discharge Instructions for Patients Receiving Chemotherapy  Today you received the following chemotherapy agents:  Herceptin  To help prevent nausea and vomiting after your treatment, we encourage you to take your nausea medication as prescribed.   If you develop nausea and vomiting that is not controlled by your nausea medication, call the clinic.   BELOW ARE SYMPTOMS THAT SHOULD BE REPORTED IMMEDIATELY:  *FEVER GREATER THAN 100.5 F  *CHILLS WITH OR WITHOUT FEVER  NAUSEA AND VOMITING THAT IS NOT CONTROLLED WITH YOUR NAUSEA MEDICATION  *UNUSUAL SHORTNESS OF BREATH  *UNUSUAL BRUISING OR BLEEDING  TENDERNESS IN MOUTH AND THROAT WITH OR WITHOUT PRESENCE OF ULCERS  *URINARY PROBLEMS  *BOWEL PROBLEMS  UNUSUAL RASH Items with * indicate a potential emergency and should be followed up as soon as possible.  Feel free to call the clinic you have any questions or concerns. The clinic phone number is [phone].  Please show the CHEMO ALERT CARD at check-in to the Emergency Department and triage nurse.   

## 2015-05-27 NOTE — Progress Notes (Signed)
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 Taxol held today, to be re-evaluated next week.

## 2015-05-28 ENCOUNTER — Other Ambulatory Visit: Payer: Self-pay | Admitting: Nurse Practitioner

## 2015-06-03 ENCOUNTER — Telehealth: Payer: Self-pay | Admitting: Nurse Practitioner

## 2015-06-03 ENCOUNTER — Other Ambulatory Visit: Payer: Self-pay | Admitting: Nurse Practitioner

## 2015-06-03 ENCOUNTER — Ambulatory Visit (HOSPITAL_BASED_OUTPATIENT_CLINIC_OR_DEPARTMENT_OTHER): Payer: Medicare Other

## 2015-06-03 ENCOUNTER — Ambulatory Visit (HOSPITAL_BASED_OUTPATIENT_CLINIC_OR_DEPARTMENT_OTHER): Payer: Medicare Other | Admitting: Oncology

## 2015-06-03 ENCOUNTER — Other Ambulatory Visit: Payer: Self-pay | Admitting: Oncology

## 2015-06-03 ENCOUNTER — Other Ambulatory Visit (HOSPITAL_BASED_OUTPATIENT_CLINIC_OR_DEPARTMENT_OTHER): Payer: Medicare Other

## 2015-06-03 VITALS — BP 135/76 | HR 89 | Temp 98.9°F | Resp 18 | Ht 63.2 in | Wt 173.1 lb

## 2015-06-03 DIAGNOSIS — G629 Polyneuropathy, unspecified: Secondary | ICD-10-CM | POA: Diagnosis not present

## 2015-06-03 DIAGNOSIS — C50412 Malignant neoplasm of upper-outer quadrant of left female breast: Secondary | ICD-10-CM | POA: Diagnosis present

## 2015-06-03 DIAGNOSIS — R197 Diarrhea, unspecified: Secondary | ICD-10-CM

## 2015-06-03 DIAGNOSIS — C50511 Malignant neoplasm of lower-outer quadrant of right female breast: Secondary | ICD-10-CM

## 2015-06-03 DIAGNOSIS — Z5112 Encounter for antineoplastic immunotherapy: Secondary | ICD-10-CM | POA: Diagnosis present

## 2015-06-03 DIAGNOSIS — M545 Low back pain: Secondary | ICD-10-CM

## 2015-06-03 DIAGNOSIS — J90 Pleural effusion, not elsewhere classified: Secondary | ICD-10-CM

## 2015-06-03 DIAGNOSIS — M8580 Other specified disorders of bone density and structure, unspecified site: Secondary | ICD-10-CM | POA: Diagnosis not present

## 2015-06-03 LAB — CBC WITH DIFFERENTIAL/PLATELET
BASO%: 1.7 % (ref 0.0–2.0)
BASOS ABS: 0.1 10*3/uL (ref 0.0–0.1)
EOS ABS: 0.3 10*3/uL (ref 0.0–0.5)
EOS%: 6 % (ref 0.0–7.0)
HEMATOCRIT: 37.5 % (ref 34.8–46.6)
HGB: 12.5 g/dL (ref 11.6–15.9)
LYMPH#: 1.3 10*3/uL (ref 0.9–3.3)
LYMPH%: 29.8 % (ref 14.0–49.7)
MCH: 29.9 pg (ref 25.1–34.0)
MCHC: 33.3 g/dL (ref 31.5–36.0)
MCV: 89.7 fL (ref 79.5–101.0)
MONO#: 0.7 10*3/uL (ref 0.1–0.9)
MONO%: 16.9 % — ABNORMAL HIGH (ref 0.0–14.0)
NEUT#: 1.9 10*3/uL (ref 1.5–6.5)
NEUT%: 45.6 % (ref 38.4–76.8)
PLATELETS: 217 10*3/uL (ref 145–400)
RBC: 4.18 10*6/uL (ref 3.70–5.45)
RDW: 15.1 % — ABNORMAL HIGH (ref 11.2–14.5)
WBC: 4.2 10*3/uL (ref 3.9–10.3)

## 2015-06-03 LAB — COMPREHENSIVE METABOLIC PANEL
ALT: 21 U/L (ref 0–55)
ANION GAP: 10 meq/L (ref 3–11)
AST: 16 U/L (ref 5–34)
Albumin: 3.8 g/dL (ref 3.5–5.0)
Alkaline Phosphatase: 64 U/L (ref 40–150)
BILIRUBIN TOTAL: 0.33 mg/dL (ref 0.20–1.20)
BUN: 9.4 mg/dL (ref 7.0–26.0)
CALCIUM: 10.3 mg/dL (ref 8.4–10.4)
CHLORIDE: 108 meq/L (ref 98–109)
CO2: 24 mEq/L (ref 22–29)
CREATININE: 1.1 mg/dL (ref 0.6–1.1)
EGFR: 50 mL/min/{1.73_m2} — ABNORMAL LOW (ref >90)
Glucose: 101 mg/dl (ref 70–140)
Potassium: 4.3 mEq/L (ref 3.5–5.1)
Sodium: 142 mEq/L (ref 136–145)
Total Protein: 7.2 g/dL (ref 6.4–8.3)

## 2015-06-03 MED ORDER — SODIUM CHLORIDE 0.9% FLUSH
10.0000 mL | INTRAVENOUS | Status: DC | PRN
  Administered 2015-06-03: 10 mL
  Filled 2015-06-03: qty 10

## 2015-06-03 MED ORDER — TRASTUZUMAB CHEMO INJECTION 440 MG
2.0000 mg/kg | Freq: Once | INTRAVENOUS | Status: AC
  Administered 2015-06-03: 168 mg via INTRAVENOUS
  Filled 2015-06-03: qty 8

## 2015-06-03 MED ORDER — DIPHENHYDRAMINE HCL 50 MG/ML IJ SOLN
INTRAMUSCULAR | Status: AC
  Filled 2015-06-03: qty 1

## 2015-06-03 MED ORDER — ACETAMINOPHEN 325 MG PO TABS
ORAL_TABLET | ORAL | Status: AC
  Filled 2015-06-03: qty 2

## 2015-06-03 MED ORDER — HEPARIN SOD (PORK) LOCK FLUSH 100 UNIT/ML IV SOLN
500.0000 [IU] | Freq: Once | INTRAVENOUS | Status: AC | PRN
  Administered 2015-06-03: 500 [IU]
  Filled 2015-06-03: qty 5

## 2015-06-03 MED ORDER — DIPHENHYDRAMINE HCL 50 MG/ML IJ SOLN
25.0000 mg | Freq: Once | INTRAMUSCULAR | Status: AC
  Administered 2015-06-03: 25 mg via INTRAVENOUS

## 2015-06-03 MED ORDER — ACETAMINOPHEN 325 MG PO TABS
650.0000 mg | ORAL_TABLET | Freq: Once | ORAL | Status: AC
  Administered 2015-06-03: 650 mg via ORAL

## 2015-06-03 MED ORDER — SODIUM CHLORIDE 0.9 % IV SOLN
Freq: Once | INTRAVENOUS | Status: AC
  Administered 2015-06-03: 11:00:00 via INTRAVENOUS

## 2015-06-03 NOTE — Progress Notes (Signed)
[place]  [phone] Fax:(336) [phone]   ID: Isabella Isabella DOB: April 12, 1940  MR#: [id number]  OAC#:[id number]  Patient Care Team: [name], FNP as PCP - General (Family Medicine) [name], MD as Consulting Physician (Oncology) [name], MD as Consulting Physician (General Surgery) [name], NP as Nurse Practitioner (Hematology and Oncology) [name], MD as Consulting Physician (Oncology) Thea Silversmith, MD as Consulting Physician (Radiation Oncology) [name], MD as Consulting Physician (Neurosurgery) [name], MD as Referring Physician (Otolaryngology) [name], MD as Consulting Physician (Cardiology) PCP: [name], FNP OTHER MD:  CHIEF COMPLAINT: Isabella Isabella positive breast cancer, bilateral beast cancers  CURRENT TREATMENT: Paclitaxel, trastuzumab  BREAST CANCER HISTORY:   From the original intake note:  Isabella Isabella had routine screening mammography at [facility] suggesting an area of indeterminate microcalcifications in the upper outer quadrant of the left breast measuring 8 mm. She was referred for left breast stereotactic biopsy performed at the [facility] 02/27/2015. This showed ([id number]) invasive ductal carcinoma, grade 2, estrogen receptor 100% positive, progesterone receptor 100% positive, both with strong staining intensity, with an MIB-1 of 20%, and Isabella Isabella amplified with a signals ratio of 2.53, the number per cell being 2.65. 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  On 03/25/2015 the patient underwent bilateral breast MRI, which showed the breast density to be category B. this showed, in the left breast, an area of non-masslike enhancement measuring 7.8 cm. Associated with this were 2 adjacent irregular masses measuring 1.7 and 1.4 cm. The masses were 5 cm anterior and superior to the biopsy clip. There was a cortically thickened left axillary lymph node  noted.  Also in the right breast there was a 7 mm irregular enhancing mass at the 6:30 o'clock position.  Isabella subsequent history is as detailed below    INTERVAL HISTORY: Isabella Isabella returns today for follow-up of Isabella Isabella Isabella positive breast cancer, accompanied by Isabella Isabella. We held Isabella paclitaxel last week because she was having neuropathy in both feet, right greater than left. The neuropathy persists. It is lateral in the feet rather than being on the toes and front of the foot, which is unusual. Nevertheless it has not resolved and it was not present before chemotherapy.Accordingly we are going to have to change treatments   REVIEW OF SYSTEMS: Isabella Isabella is having some loose bowel movements, but she is on amoxicillin currently. They found some coliform bacteria on Isabella well water and that is being taken care of. He feels very tired. She likes the steroids because a give Isabella extra energy. She feels a little bit irritable. She has some sinus symptoms. She has back pain which is not a new problem or a more persistent or intense problem than before.she tells me she "Isabella Isabella steroids", which give Isabella extra energy of course.  Detailed review of systems today was otherwise noncontributory   PAST MEDICAL HISTORY: Past Medical History  Diagnosis Date  . GERD (gastroesophageal reflux disease)   . Arthritis     wrists and knees  . Cancer ([facility]) 03-2015    left breast  . Multiple food allergies   . Urinary tract infection   . History of hiatal hernia   . History of jaundice as a child     PAST SURGICAL HISTORY: Past Surgical History  Procedure Laterality Date  . Abdominal hysterectomy    . Cholecystectomy    . Appendectomy    . Tubal ligation    .  Back surgery      lumbar  . Hernia repair      UHR  . Cervical disc surgery  2016  . Portacath placement Right 04/22/2015    Procedure: INSERTION PORT-A-CATH WITH [place];  Surgeon: [name], MD;  Location: [facility];  Service: General;  Laterality:  Right;    FAMILY HISTORY No family history on file. The patient's father had a history of pseudo-bulbar pulse 26 and died from a stroke at age 38. The patient's mother died at age 72 from complications of emphysema. The patient had no brothers, 2 sisters. There is no history of cancer in the family and specifically no history of breast or ovarian cancer.  GYNECOLOGIC HISTORY:  No LMP recorded. Patient has had a hysterectomy. Menarche age 70, first live birth age 64. She is GX P2. She underwent a hysterectomy with left salpingo-oophorectomy in 1984. She received hormone replacement for approximately 15 years, until 2005.  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 Daughter Isabella Isabella lives in [place] where she is a housewife, homeschooling Isabella children. Son Isabella Isabella lives in [place] and works in Chartered certified accountant. The patient has 2 grandchildren. She is not a church attender    ADVANCED DIRECTIVES: In place   HEALTH MAINTENANCE: Social History  Substance Use Topics  . Smoking status: Never Smoker   . Smokeless tobacco: Not on file  . Alcohol Use: Yes     Comment: occ 1 every 2-3 months     Colonoscopy:2014  PAP:  Bone density: 2017 [facility]//osteopenia   Lipid panel:  Allergies  Allergen Reactions  . Dairy Aid [Lactase] Anaphylaxis     Takes claritin every day and benadryl, if needed, to prevent anaphylaxis   . Eggs Or Egg-Derived Products Anaphylaxis    Nasal stuffiness, Takes claritin every day and benadryl, if needed, to prevent anaphylaxis  . Gelatin (Bovine) [Beef Extract] Anaphylaxis    Muscle pain,  Takes claritin every day and benadryl, if needed, to prevent anaphylaxis   . Gluten Meal Anaphylaxis and Diarrhea    Takes claritin every day and benadryl, if needed, to prevent anaphylaxis   . Lambs Quarters Anaphylaxis     ALL "mammal" MEAT per pt -- Muscle pain  Can eat chicken, fish, [place]  . Milk-Related Compounds Anaphylaxis    Nasal stuffiness Cheese (mozzarella, swiss, cheddar, cottage)  . Pork Allergy Anaphylaxis    ALL "mammal" MEAT per pt -- Muscle pain  Can eat chicken, fish, [place]  . Wheat Bran Anaphylaxis and Diarrhea     Takes claritin every day and benadryl, if needed, to prevent anaphylaxis   . Whey Anaphylaxis and Diarrhea     Takes claritin every day and benadryl, if needed, to prevent anaphylaxis   . Yeast Anaphylaxis     Takes claritin every day and benadryl, if needed, to prevent anaphylaxis  . Soy Allergy Diarrhea  . Almond Oil Nausea And Vomiting    Current Outpatient Prescriptions  Medication Sig Dispense Refill  . prochlorperazine (COMPAZINE) 10 MG tablet Take 1 tablet (10 mg total) by mouth every 6 (six) hours as needed (Nausea or vomiting). 30 tablet 1  . amoxicillin (AMOXIL) 250 MG capsule Take 250 mg by mouth 2 (two) times daily.  0  . calcium carbonate (TUMS - DOSED IN MG ELEMENTAL CALCIUM) 500 MG chewable tablet Chew 500 mg by mouth  daily as needed for indigestion or heartburn.     . diphenhydrAMINE (SOMINEX) 25 MG tablet Take 25 mg by mouth at bedtime as needed. Reported on 05/20/2015    . famotidine (PEPCID) 20 MG tablet Take 20 mg by mouth daily.     Marland Kitchen lidocaine-prilocaine (EMLA) cream Apply to affected area once 30 g 3  . loratadine (CLARITIN) 10 MG tablet Take 10 mg by mouth daily.    Marland Kitchen LORazepam (ATIVAN) 0.5 MG tablet Take 1 tablet (0.5 mg total) by mouth at bedtime as needed (Nausea or vomiting). 15 tablet 0  . Magnesium Citrate 100 MG TABS Take 200 mg by mouth daily.    . Methylcellulose, Laxative, (CITRUCEL) 500 MG TABS Take 2,000 mg by mouth 2 (two) times daily.    . naproxen sodium (ANAPROX) 220 MG tablet Take 440 mg by mouth 3 (three) times daily with meals.    . ondansetron (ZOFRAN) 8 MG tablet Take 1 tablet (8 mg total) by mouth 2 (two) times daily as needed  (Nausea or vomiting). 30 tablet 1  . Polyethyl Glycol-Propyl Glycol (SYSTANE ULTRA) 0.4-0.3 % SOLN Place 1 drop into both eyes daily as needed (dry eyes).    . Probiotic Product (ALIGN PO) Take 1 tablet by mouth daily.     Marland Kitchen UNABLE TO FIND Place 2 Squirts into the nose 4 (four) times daily. Gentamicin 0.025% Mupirocin 0.2%    . VITAMIN D-VITAMIN K PO Take 1 capsule by mouth daily. Reported on 04/15/2015     No current facility-administered medications for this visit.    OBJECTIVE: Middle-aged white woman in no acute distress Filed Vitals:   06/03/15 0933  BP: 135/76  Pulse: 89  Temp: 98.9 F (37.2 C)  Resp: 18     Body mass index is 30.48 kg/(m^2).    ECOG FS:1 - Symptomatic but completely ambulatory  Sclerae unicteric, pupils round and equal Oropharynx clear and moist-- no thrush or other lesions No cervical or supraclavicular adenopathy Lungs no rales or rhonchi Heart regular rate and rhythm Abd soft, nontender, positive bowel sounds MSK no focal spinal tenderness, no upper extremity lymphedema Neuro: nonfocal, well oriented, appropriate affect Breasts: I do not palpate a suspicious mass in either breast. Both axillae are benign.    LAB RESULTS:  CMP     Component Value Date/Time   NA 142 06/03/2015 0913   K 4.3 06/03/2015 0913   CO2 24 06/03/2015 0913   GLUCOSE 101 06/03/2015 0913   BUN 9.4 06/03/2015 0913   CREATININE 1.1 06/03/2015 0913   CALCIUM 10.3 06/03/2015 0913   PROT 7.2 06/03/2015 0913   ALBUMIN 3.8 06/03/2015 0913   AST 16 06/03/2015 0913   ALT 21 06/03/2015 0913   ALKPHOS 64 06/03/2015 0913   BILITOT 0.33 06/03/2015 0913    INo results found for: SPEP, UPEP  Lab Results  Component Value Date   WBC 4.2 06/03/2015   NEUTROABS 1.9 06/03/2015   HGB 12.5 06/03/2015   HCT 37.5 06/03/2015   MCV 89.7 06/03/2015   PLT 217 06/03/2015      Chemistry      Component Value Date/Time   NA 142 06/03/2015 0913   K 4.3 06/03/2015 0913   CO2 24  06/03/2015 0913   BUN 9.4 06/03/2015 0913   CREATININE 1.1 06/03/2015 0913      Component Value Date/Time   CALCIUM 10.3 06/03/2015 0913   ALKPHOS 64 06/03/2015 0913   AST 16 06/03/2015 0913   ALT 21  06/03/2015 0913   BILITOT 0.33 06/03/2015 0913       No results found for: LABCA2  No components found for: ZOXWR604  No results for input(s): INR in the last 168 hours.  Urinalysis No results found for: COLORURINE, APPEARANCEUR, LABSPEC, PHURINE, GLUCOSEU, HGBUR, BILIRUBINUR, KETONESUR, PROTEINUR, UROBILINOGEN, NITRITE, LEUKOCYTESUR   ELIGIBLE FOR AVAILABLE RESEARCH PROTOCOL: no  STUDIES: No results found.  ASSESSMENT: 75 y.o. [facility] woman  status post left breast biopsy 02/27/2015 for a clinical mT1-3 N1 invasive ductal carcinoma, grade 2, strongly estrogen and progesterone receptor positive, Isabella Isabella amplified, with an MIB-1 of 20%.   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  (b) right breast lower outer quadrant: a clinical T1b N0, stage IA invasive ductal carcinoma, estrogen and progesterone receptor positive, Isabella Isabella not amplified, with an Mib-1 of 4%  (2)  neoadjuvant chemotherapy consisting of weekly paclitaxel 12 together with weekly trastuzumab started 04/29/2015  (a) aclitaxel discontinued after 4 cycles because of neuropathy  (b) carboplatin/gemcitabine substituted, starting 06/10/2015  (3) trastuzumab will be continued every 3 weeks for an additional 9 months after completion of chemotherapy  (a) echo 04/25/2015 shows an ejection fraction of 55-60%   (4) definitive surgery to follow  chemotherapy  (5) adjuvant radiation therapy to follow surgery  (6) tamoxifen to start at the completion of local treatment  (a) the patient has been asked to discontinue Estring as of 05/05/2015, to be resumed when tamoxifen is started   PLAN: Even though the neuropathy Isabella Isabella is experiencing is atypical, it certainly was not present before we started chemotherapy. It is not resolving off treatment. Accordingly we are stopping the paclitaxel.  We are going to substitute carboplatin and gemcitabine. We discussed the possible toxicities, side effects and complications of these agents. She understands she may have more nausea and more fatigued and she will take Zofran days 2 through 5 and prochlorperazine on an as-needed basis. She will receive the treatments on days 1 and 8beginning next week been off the third week. She will have 2 cycles so a total of 4 doses of carboplatin and Gemzar. After that she can proceed to surgery  Isabella Isabella is having some loose bowel movements which I think you're going to be due to Isabella amoxicillin and not to the coliform bacteria in Isabella well, but that problem does need to be taken care of. They are having their water retested. She had a variety of tests sent by Isabella primary care physician we discussed the difference between IgG and IgM positivitybut they will discuss that when she sees Isabella next, which will be sometime next week  Isabella Isabella call for any problems that may develop before Isabella next visit here.  [name], MD   06/03/2015 10:11 AM

## 2015-06-03 NOTE — Telephone Encounter (Signed)
appt made and avs printed °

## 2015-06-03 NOTE — Patient Instructions (Signed)
[facility] Discharge Instructions for Patients Receiving Chemotherapy  Today you received the following chemotherapy agents:  Herceptin  To help prevent nausea and vomiting after your treatment, we encourage you to take your nausea medication as prescribed.   If you develop nausea and vomiting that is not controlled by your nausea medication, call the clinic.   BELOW ARE SYMPTOMS THAT SHOULD BE REPORTED IMMEDIATELY:  *FEVER GREATER THAN 100.5 F  *CHILLS WITH OR WITHOUT FEVER  NAUSEA AND VOMITING THAT IS NOT CONTROLLED WITH YOUR NAUSEA MEDICATION  *UNUSUAL SHORTNESS OF BREATH  *UNUSUAL BRUISING OR BLEEDING  TENDERNESS IN MOUTH AND THROAT WITH OR WITHOUT PRESENCE OF ULCERS  *URINARY PROBLEMS  *BOWEL PROBLEMS  UNUSUAL RASH Items with * indicate a potential emergency and should be followed up as soon as possible.  Feel free to call the clinic you have any questions or concerns. The clinic phone number is [phone].  Please show the CHEMO ALERT CARD at check-in to the Emergency Department and triage nurse.   

## 2015-06-10 ENCOUNTER — Other Ambulatory Visit (HOSPITAL_BASED_OUTPATIENT_CLINIC_OR_DEPARTMENT_OTHER): Payer: Medicare Other

## 2015-06-10 ENCOUNTER — Ambulatory Visit (HOSPITAL_BASED_OUTPATIENT_CLINIC_OR_DEPARTMENT_OTHER): Payer: Medicare Other | Admitting: Nurse Practitioner

## 2015-06-10 ENCOUNTER — Encounter: Payer: Self-pay | Admitting: Nurse Practitioner

## 2015-06-10 ENCOUNTER — Ambulatory Visit (HOSPITAL_BASED_OUTPATIENT_CLINIC_OR_DEPARTMENT_OTHER): Payer: Medicare Other

## 2015-06-10 VITALS — BP 136/75 | HR 90 | Temp 98.4°F | Resp 18 | Ht 63.2 in | Wt 171.4 lb

## 2015-06-10 DIAGNOSIS — M545 Low back pain: Secondary | ICD-10-CM | POA: Diagnosis not present

## 2015-06-10 DIAGNOSIS — M8580 Other specified disorders of bone density and structure, unspecified site: Secondary | ICD-10-CM

## 2015-06-10 DIAGNOSIS — C50511 Malignant neoplasm of lower-outer quadrant of right female breast: Secondary | ICD-10-CM

## 2015-06-10 DIAGNOSIS — G629 Polyneuropathy, unspecified: Secondary | ICD-10-CM

## 2015-06-10 DIAGNOSIS — Z5112 Encounter for antineoplastic immunotherapy: Secondary | ICD-10-CM

## 2015-06-10 DIAGNOSIS — Z5111 Encounter for antineoplastic chemotherapy: Secondary | ICD-10-CM | POA: Diagnosis not present

## 2015-06-10 DIAGNOSIS — C50412 Malignant neoplasm of upper-outer quadrant of left female breast: Secondary | ICD-10-CM

## 2015-06-10 LAB — COMPREHENSIVE METABOLIC PANEL
ALBUMIN: 4 g/dL (ref 3.5–5.0)
ALK PHOS: 69 U/L (ref 40–150)
ALT: 13 U/L (ref 0–55)
ANION GAP: 8 meq/L (ref 3–11)
AST: 13 U/L (ref 5–34)
BUN: 10 mg/dL (ref 7.0–26.0)
CALCIUM: 10.3 mg/dL (ref 8.4–10.4)
CO2: 27 mEq/L (ref 22–29)
Chloride: 107 mEq/L (ref 98–109)
Creatinine: 1.1 mg/dL (ref 0.6–1.1)
EGFR: 48 mL/min/{1.73_m2} — AB (ref >90)
Glucose: 123 mg/dl (ref 70–140)
POTASSIUM: 4.5 meq/L (ref 3.5–5.1)
Sodium: 142 mEq/L (ref 136–145)
Total Bilirubin: 0.33 mg/dL (ref 0.20–1.20)
Total Protein: 7.7 g/dL (ref 6.4–8.3)

## 2015-06-10 LAB — CBC WITH DIFFERENTIAL/PLATELET
BASO%: 3.2 % — ABNORMAL HIGH (ref 0.0–2.0)
BASOS ABS: 0.2 10*3/uL — AB (ref 0.0–0.1)
EOS ABS: 0.3 10*3/uL (ref 0.0–0.5)
EOS%: 4.5 % (ref 0.0–7.0)
HEMATOCRIT: 39.7 % (ref 34.8–46.6)
HEMOGLOBIN: 13.1 g/dL (ref 11.6–15.9)
LYMPH#: 1.2 10*3/uL (ref 0.9–3.3)
LYMPH%: 17.9 % (ref 14.0–49.7)
MCH: 29.1 pg (ref 25.1–34.0)
MCHC: 32.9 g/dL (ref 31.5–36.0)
MCV: 88.6 fL (ref 79.5–101.0)
MONO#: 0.7 10*3/uL (ref 0.1–0.9)
MONO%: 10.2 % (ref 0.0–14.0)
NEUT#: 4.4 10*3/uL (ref 1.5–6.5)
NEUT%: 64.2 % (ref 38.4–76.8)
PLATELETS: 255 10*3/uL (ref 145–400)
RBC: 4.48 10*6/uL (ref 3.70–5.45)
RDW: 15.5 % — AB (ref 11.2–14.5)
WBC: 6.9 10*3/uL (ref 3.9–10.3)

## 2015-06-10 MED ORDER — PALONOSETRON HCL INJECTION 0.25 MG/5ML
INTRAVENOUS | Status: AC
  Filled 2015-06-10: qty 5

## 2015-06-10 MED ORDER — HEPARIN SOD (PORK) LOCK FLUSH 100 UNIT/ML IV SOLN
500.0000 [IU] | Freq: Once | INTRAVENOUS | Status: AC | PRN
  Administered 2015-06-10: 500 [IU]
  Filled 2015-06-10: qty 5

## 2015-06-10 MED ORDER — ACETAMINOPHEN 325 MG PO TABS
650.0000 mg | ORAL_TABLET | Freq: Once | ORAL | Status: AC
  Administered 2015-06-10: 650 mg via ORAL

## 2015-06-10 MED ORDER — SODIUM CHLORIDE 0.9 % IV SOLN
162.8000 mg | Freq: Once | INTRAVENOUS | Status: AC
  Administered 2015-06-10: 160 mg via INTRAVENOUS
  Filled 2015-06-10: qty 16

## 2015-06-10 MED ORDER — SODIUM CHLORIDE 0.9 % IV SOLN: Freq: Once | INTRAVENOUS | Status: DC

## 2015-06-10 MED ORDER — SODIUM CHLORIDE 0.9 % IV SOLN
1000.0000 mg/m2 | Freq: Once | INTRAVENOUS | Status: AC
  Administered 2015-06-10: 1862 mg via INTRAVENOUS
  Filled 2015-06-10: qty 49

## 2015-06-10 MED ORDER — SODIUM CHLORIDE 0.9 % IV SOLN
10.0000 mg | Freq: Once | INTRAVENOUS | Status: AC
  Administered 2015-06-10: 10 mg via INTRAVENOUS
  Filled 2015-06-10: qty 1

## 2015-06-10 MED ORDER — DIPHENHYDRAMINE HCL 50 MG/ML IJ SOLN
INTRAMUSCULAR | Status: AC
  Filled 2015-06-10: qty 1

## 2015-06-10 MED ORDER — SODIUM CHLORIDE 0.9 % IV SOLN
Freq: Once | INTRAVENOUS | Status: AC
  Administered 2015-06-10: 12:00:00 via INTRAVENOUS

## 2015-06-10 MED ORDER — ACETAMINOPHEN 325 MG PO TABS
ORAL_TABLET | ORAL | Status: AC
  Filled 2015-06-10: qty 2

## 2015-06-10 MED ORDER — DIPHENHYDRAMINE HCL 50 MG/ML IJ SOLN
25.0000 mg | Freq: Once | INTRAMUSCULAR | Status: AC
  Administered 2015-06-10: 25 mg via INTRAVENOUS

## 2015-06-10 MED ORDER — TRASTUZUMAB CHEMO INJECTION 440 MG
2.0000 mg/kg | Freq: Once | INTRAVENOUS | Status: AC
  Administered 2015-06-10: 168 mg via INTRAVENOUS
  Filled 2015-06-10: qty 8

## 2015-06-10 MED ORDER — SODIUM CHLORIDE 0.9% FLUSH
10.0000 mL | INTRAVENOUS | Status: DC | PRN
  Administered 2015-06-10: 10 mL
  Filled 2015-06-10: qty 10

## 2015-06-10 MED ORDER — PALONOSETRON HCL INJECTION 0.25 MG/5ML
0.2500 mg | Freq: Once | INTRAVENOUS | Status: AC
  Administered 2015-06-10: 0.25 mg via INTRAVENOUS

## 2015-06-10 NOTE — Patient Instructions (Signed)
[facility] Discharge Instructions for Patients Receiving Chemotherapy  Today you received the following chemotherapy agents gemzar, carboplatin  To help prevent nausea and vomiting after your treatment, we encourage you to take your nausea medication as directed  If you develop nausea and vomiting that is not controlled by your nausea medication, call the clinic.   BELOW ARE SYMPTOMS THAT SHOULD BE REPORTED IMMEDIATELY:  *FEVER GREATER THAN 100.5 F  *CHILLS WITH OR WITHOUT FEVER  NAUSEA AND VOMITING THAT IS NOT CONTROLLED WITH YOUR NAUSEA MEDICATION  *UNUSUAL SHORTNESS OF BREATH  *UNUSUAL BRUISING OR BLEEDING  TENDERNESS IN MOUTH AND THROAT WITH OR WITHOUT PRESENCE OF ULCERS  *URINARY PROBLEMS  *BOWEL PROBLEMS  UNUSUAL RASH Items with * indicate a potential emergency and should be followed up as soon as possible.  Feel free to call the clinic you have any questions or concerns. The clinic phone number is [phone].  Please show the [facility] at check-in to the Emergency Department and triage nurse.    Carboplatin injection What is this medicine? CARBOPLATIN (KAR boe pla tin) is a chemotherapy drug. It targets fast dividing cells, like cancer cells, and causes these cells to die. This medicine is used to treat ovarian cancer and many other cancers. This medicine may be used for other purposes; ask your health care provider or pharmacist if you have questions. What should I tell my health care provider before I take this medicine? They need to know if you have any of these conditions: -blood disorders -hearing problems -kidney disease -recent or ongoing radiation therapy -an unusual or allergic reaction to carboplatin, cisplatin, other chemotherapy, other medicines, foods, dyes, or preservatives -pregnant or trying to get pregnant -breast-feeding How should I use this medicine? This drug is usually given as an infusion into a vein. It is  administered in a hospital or clinic by a specially trained health care professional. Talk to your pediatrician regarding the use of this medicine in children. Special care may be needed. Overdosage: If you think you have taken too much of this medicine contact a poison control center or emergency room at once. NOTE: This medicine is only for you. Do not share this medicine with others. What if I miss a dose? It is important not to miss a dose. Call your doctor or health care professional if you are unable to keep an appointment. What may interact with this medicine? -medicines for seizures -medicines to increase blood counts like filgrastim, pegfilgrastim, sargramostim -some antibiotics like amikacin, gentamicin, neomycin, streptomycin, tobramycin -vaccines Talk to your doctor or health care professional before taking any of these medicines: -acetaminophen -aspirin -ibuprofen -ketoprofen -naproxen This list may not describe all possible interactions. Give your health care provider a list of all the medicines, herbs, non-prescription drugs, or dietary supplements you use. Also tell them if you smoke, drink alcohol, or use illegal drugs. Some items may interact with your medicine. What should I watch for while using this medicine? Your condition will be monitored carefully while you are receiving this medicine. You will need important blood work done while you are taking this medicine. This drug may make you feel generally unwell. This is not uncommon, as chemotherapy can affect healthy cells as well as cancer cells. Report any side effects. Continue your course of treatment even though you feel ill unless your doctor tells you to stop. In some cases, you may be given additional medicines to help with side effects. Follow all directions for their  use. Call your doctor or health care professional for advice if you get a fever, chills or sore throat, or other symptoms of a cold or flu. Do not  treat yourself. This drug decreases your body's ability to fight infections. Try to avoid being around people who are sick. This medicine may increase your risk to bruise or bleed. Call your doctor or health care professional if you notice any unusual bleeding. Be careful brushing and flossing your teeth or using a toothpick because you may get an infection or bleed more easily. If you have any dental work done, tell your dentist you are receiving this medicine. Avoid taking products that contain aspirin, acetaminophen, ibuprofen, naproxen, or ketoprofen unless instructed by your doctor. These medicines may hide a fever. Do not become pregnant while taking this medicine. Women should inform their doctor if they wish to become pregnant or think they might be pregnant. There is a potential for serious side effects to an unborn child. Talk to your health care professional or pharmacist for more information. Do not breast-feed an infant while taking this medicine. What side effects may I notice from receiving this medicine? Side effects that you should report to your doctor or health care professional as soon as possible: -allergic reactions like skin rash, itching or hives, swelling of the face, lips, or tongue -signs of infection - fever or chills, cough, sore throat, pain or difficulty passing urine -signs of decreased platelets or bleeding - bruising, pinpoint red spots on the skin, black, tarry stools, nosebleeds -signs of decreased red blood cells - unusually weak or tired, fainting spells, lightheadedness -breathing problems -changes in hearing -changes in vision -chest pain -high blood pressure -low blood counts - This drug may decrease the number of white blood cells, red blood cells and platelets. You may be at increased risk for infections and bleeding. -nausea and vomiting -pain, swelling, redness or irritation at the injection site -pain, tingling, numbness in the hands or feet -problems  with balance, talking, walking -trouble passing urine or change in the amount of urine Side effects that usually do not require medical attention (report to your doctor or health care professional if they continue or are bothersome): -hair loss -loss of appetite -metallic taste in the mouth or changes in taste This list may not describe all possible side effects. Call your doctor for medical advice about side effects. You may report side effects to FDA at 1-800-FDA-1088. Where should I keep my medicine? This drug is given in a hospital or clinic and will not be stored at home. NOTE: This sheet is a summary. It may not cover all possible information. If you have questions about this medicine, talk to your doctor, pharmacist, or health care provider.    2016, Elsevier/Gold Standard. (2007-03-28 14:38:05)   Gemcitabine injection What is this medicine? GEMCITABINE (jem SIT a been) is a chemotherapy drug. This medicine is used to treat many types of cancer like breast cancer, lung cancer, pancreatic cancer, and ovarian cancer. This medicine may be used for other purposes; ask your health care provider or pharmacist if you have questions. What should I tell my health care provider before I take this medicine? They need to know if you have any of these conditions: -blood disorders -infection -kidney disease -liver disease -recent or ongoing radiation therapy -an unusual or allergic reaction to gemcitabine, other chemotherapy, other medicines, foods, dyes, or preservatives -pregnant or trying to get pregnant -breast-feeding How should I use this medicine? This drug  is given as an infusion into a vein. It is administered in a hospital or clinic by a specially trained health care professional. Talk to your pediatrician regarding the use of this medicine in children. Special care may be needed. Overdosage: If you think you have taken too much of this medicine contact a poison control center or  emergency room at once. NOTE: This medicine is only for you. Do not share this medicine with others. What if I miss a dose? It is important not to miss your dose. Call your doctor or health care professional if you are unable to keep an appointment. What may interact with this medicine? -medicines to increase blood counts like filgrastim, pegfilgrastim, sargramostim -some other chemotherapy drugs like cisplatin -vaccines Talk to your doctor or health care professional before taking any of these medicines: -acetaminophen -aspirin -ibuprofen -ketoprofen -naproxen This list may not describe all possible interactions. Give your health care provider a list of all the medicines, herbs, non-prescription drugs, or dietary supplements you use. Also tell them if you smoke, drink alcohol, or use illegal drugs. Some items may interact with your medicine. What should I watch for while using this medicine? Visit your doctor for checks on your progress. This drug may make you feel generally unwell. This is not uncommon, as chemotherapy can affect healthy cells as well as cancer cells. Report any side effects. Continue your course of treatment even though you feel ill unless your doctor tells you to stop. In some cases, you may be given additional medicines to help with side effects. Follow all directions for their use. Call your doctor or health care professional for advice if you get a fever, chills or sore throat, or other symptoms of a cold or flu. Do not treat yourself. This drug decreases your body's ability to fight infections. Try to avoid being around people who are sick. This medicine may increase your risk to bruise or bleed. Call your doctor or health care professional if you notice any unusual bleeding. Be careful brushing and flossing your teeth or using a toothpick because you may get an infection or bleed more easily. If you have any dental work done, tell your dentist you are receiving this  medicine. Avoid taking products that contain aspirin, acetaminophen, ibuprofen, naproxen, or ketoprofen unless instructed by your doctor. These medicines may hide a fever. Women should inform their doctor if they wish to become pregnant or think they might be pregnant. There is a potential for serious side effects to an unborn child. Talk to your health care professional or pharmacist for more information. Do not breast-feed an infant while taking this medicine. What side effects may I notice from receiving this medicine? Side effects that you should report to your doctor or health care professional as soon as possible: -allergic reactions like skin rash, itching or hives, swelling of the face, lips, or tongue -low blood counts - this medicine may decrease the number of white blood cells, red blood cells and platelets. You may be at increased risk for infections and bleeding. -signs of infection - fever or chills, cough, sore throat, pain or difficulty passing urine -signs of decreased platelets or bleeding - bruising, pinpoint red spots on the skin, black, tarry stools, blood in the urine -signs of decreased red blood cells - unusually weak or tired, fainting spells, lightheadedness -breathing problems -chest pain -mouth sores -nausea and vomiting -pain, swelling, redness at site where injected -pain, tingling, numbness in the hands or feet -stomach pain -  swelling of ankles, feet, hands -unusual bleeding Side effects that usually do not require medical attention (report to your doctor or health care professional if they continue or are bothersome): -constipation -diarrhea -hair loss -loss of appetite -stomach upset This list may not describe all possible side effects. Call your doctor for medical advice about side effects. You may report side effects to FDA at 1-800-FDA-1088. Where should I keep my medicine? This drug is given in a hospital or clinic and will not be stored at home. NOTE:  This sheet is a summary. It may not cover all possible information. If you have questions about this medicine, talk to your doctor, pharmacist, or health care provider.    2016, Elsevier/Gold Standard. (2007-05-02 18:45:54)

## 2015-06-10 NOTE — Progress Notes (Signed)
[facility]  [phone] [phone]   ID: Isabella Cook DOB: 07-Jun-1940  MR#: [id number]  JYN#:[id number]  Patient Care Team: [name], FNP as PCP - General (Family Medicine) [name], MD as Consulting Physician (Oncology) [name], MD as Consulting Physician (General Surgery) [name], NP as Nurse Practitioner (Hematology and Oncology) [name], MD as Consulting Physician (Oncology) Thea Silversmith, MD as Consulting Physician (Radiation Oncology) [name], MD as Consulting Physician (Neurosurgery) [name], MD as Referring Physician (Otolaryngology) [name], MD as Consulting Physician (Cardiology) PCP: [name], FNP OTHER MD:  CHIEF COMPLAINT: HER-2 positive breast cancer, bilateral beast cancers  CURRENT TREATMENT: Paclitaxel, trastuzumab  BREAST CANCER HISTORY:   From the original intake note:  Isabella Cook had routine screening mammography at [facility] suggesting an area of indeterminate microcalcifications in the upper outer quadrant of the left breast measuring 8 mm. She was referred for left breast stereotactic biopsy performed at the [facility] 02/27/2015. This showed ([id number]) invasive ductal carcinoma, grade 2, estrogen receptor 100% positive, progesterone receptor 100% positive, both with strong staining intensity, with an MIB-1 of 20%, and HER-2 amplified with a signals ratio of 2.53, the number per cell being 2.65. 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  On 03/25/2015 the patient underwent bilateral breast MRI, which showed the breast density to be category B. this showed, in the left breast, an area of non-masslike enhancement measuring 7.8 cm. Associated with this were 2 adjacent irregular masses measuring 1.7 and 1.4 cm. The masses were 5 cm anterior and superior to the biopsy clip. There was a cortically thickened left axillary lymph node  noted.  Also in the right breast there was a 7 mm irregular enhancing mass at the 6:30 o'clock position.  Her subsequent history is as detailed below    INTERVAL HISTORY: Isabella Cook returns today for follow-up of her HER-2 positive breast cancer, accompanied by her husband Isabella Cook. She is due for her next dose of weekly trastuzumab as well as day 1, cycle 1 of carboplatin and gemcitabine.   REVIEW OF SYSTEMS: Isabella Cook denies fevers, chills, nausea, or vomiting. She still has loose bowel movements, but the amoxicillin was finished yesterday. Her appetite is decent. She is fatigued, but this is mild. She has continued back pain that is no worse. 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 She denies mouth sores or rashes. A detailed review of systems is otherwise stable.  PAST MEDICAL HISTORY: Past Medical History  Diagnosis Date  . GERD (gastroesophageal reflux disease)   . Arthritis     wrists and knees  . Cancer ([place] [facility]) 03-2015    left breast  . Multiple food allergies   . Urinary tract infection   . History of hiatal hernia   . History of jaundice as a child     PAST SURGICAL HISTORY: Past Surgical History  Procedure Laterality Date  . Abdominal hysterectomy    . Cholecystectomy    . Appendectomy    . Tubal ligation    . Back surgery      lumbar  . Hernia repair      UHR  . Cervical disc surgery  2016  . Portacath placement Right 04/22/2015    Procedure: INSERTION PORT-A-CATH WITH [place];  Surgeon: [name], MD;  Location: [facility];  Service: General;  Laterality: Right;    FAMILY HISTORY No family history on file. The patient's father had a history of pseudo-bulbar  pulse 80 and died from a stroke at age 18. The patient's mother died at age 78 from complications of emphysema. The patient had no brothers, 2 sisters. There is no history of cancer in the family and specifically no history of breast or ovarian cancer.  GYNECOLOGIC HISTORY:  No LMP recorded. Patient has  had a hysterectomy. Menarche age 21, first live birth age 32. She is GX P2. She underwent a hysterectomy with left salpingo-oophorectomy in 1984. She received hormone replacement for approximately 15 years, until 2005.  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 Her husband Isabella Cook is a retired Social research officer, government. Isabella Cook lives in [place] where she is a housewife, homeschooling her children. Son Isabella Cook lives in [place] and works in Chartered certified accountant. The patient has 2 grandchildren. She is not a church attender    ADVANCED DIRECTIVES: In place   HEALTH MAINTENANCE: Social History  Substance Use Topics  . Smoking status: Never Smoker   . Smokeless tobacco: Not on file  . Alcohol Use: Yes     Comment: occ 1 every 2-3 months     Colonoscopy:2014  PAP:  Bone density: 2017 [facility]//osteopenia   Lipid panel:  Allergies  Allergen Reactions  . Dairy Aid [Lactase] Anaphylaxis     Takes claritin every day and benadryl, if needed, to prevent anaphylaxis   . Eggs Or Egg-Derived Products Anaphylaxis    Nasal stuffiness, Takes claritin every day and benadryl, if needed, to prevent anaphylaxis  . Gelatin (Bovine) [Beef Extract] Anaphylaxis    Muscle pain,  Takes claritin every day and benadryl, if needed, to prevent anaphylaxis   . Gluten Meal Anaphylaxis and Diarrhea    Takes claritin every day and benadryl, if needed, to prevent anaphylaxis   . Lambs Quarters Anaphylaxis    ALL "mammal" MEAT per pt -- Muscle pain  Can eat chicken, fish, [place]  . Milk-Related Compounds Anaphylaxis    Nasal stuffiness Cheese (mozzarella, swiss, cheddar, cottage)  . Pork Allergy Anaphylaxis    ALL "mammal" MEAT per pt -- Muscle pain  Can eat chicken, fish, [place]  . Wheat Bran Anaphylaxis and Diarrhea     Takes claritin every day and benadryl, if needed, to prevent anaphylaxis   . Whey  Anaphylaxis and Diarrhea     Takes claritin every day and benadryl, if needed, to prevent anaphylaxis   . Yeast Anaphylaxis     Takes claritin every day and benadryl, if needed, to prevent anaphylaxis  . Soy Allergy Diarrhea  . Almond Oil Nausea And Vomiting    Current Outpatient Prescriptions  Medication Sig Dispense Refill  . amoxicillin (AMOXIL) 250 MG capsule Take 250 mg by mouth 2 (two) times daily.  0  . calcium carbonate (TUMS - DOSED IN MG ELEMENTAL CALCIUM) 500 MG chewable tablet Chew 500 mg by mouth daily as needed for indigestion or heartburn.     . diphenhydrAMINE (SOMINEX) 25 MG tablet Take 25 mg by mouth at bedtime as needed. Reported on 05/20/2015    . famotidine (PEPCID) 20 MG tablet Take 20 mg by mouth daily.     Marland Kitchen loratadine (CLARITIN) 10 MG tablet Take 10 mg by mouth daily.    . Magnesium Citrate 100 MG TABS Take 200 mg by mouth daily.    . Methylcellulose, Laxative, (CITRUCEL) 500 MG TABS Take 2,000 mg by mouth 2 (two) times daily.    . naproxen  sodium (ANAPROX) 220 MG tablet Take 440 mg by mouth 3 (three) times daily with meals.    Vladimir Faster Glycol-Propyl Glycol (SYSTANE ULTRA) 0.4-0.3 % SOLN Place 1 drop into both eyes daily as needed (dry eyes).    . Probiotic Product (ALIGN PO) Take 1 tablet by mouth daily.     Marland Kitchen UNABLE TO FIND Place 2 Squirts into the nose 4 (four) times daily. Gentamicin 0.025% Mupirocin 0.2%    . VITAMIN D-VITAMIN K PO Take 1 capsule by mouth daily. Reported on 04/15/2015     No current facility-administered medications for this visit.    OBJECTIVE: Middle-aged white woman in no acute distress Filed Vitals:   06/10/15 1055  BP: 136/75  Pulse: 90  Temp: 98.4 F (36.9 C)  Resp: 18     Body mass index is 30.18 kg/(m^2).    ECOG FS:1 - Symptomatic but completely ambulatory  Sclerae unicteric, pupils round and equal Oropharynx clear and moist-- no thrush or other lesions No cervical or supraclavicular adenopathy Lungs no rales or  rhonchi Heart regular rate and rhythm Abd soft, nontender, positive bowel sounds MSK no focal spinal tenderness, no upper extremity lymphedema Neuro: nonfocal, well oriented, appropriate affect Breasts: deferred  LAB RESULTS:  CMP     Component Value Date/Time   NA 142 06/03/2015 0913   K 4.3 06/03/2015 0913   CO2 24 06/03/2015 0913   GLUCOSE 101 06/03/2015 0913   BUN 9.4 06/03/2015 0913   CREATININE 1.1 06/03/2015 0913   CALCIUM 10.3 06/03/2015 0913   PROT 7.2 06/03/2015 0913   ALBUMIN 3.8 06/03/2015 0913   AST 16 06/03/2015 0913   ALT 21 06/03/2015 0913   ALKPHOS 64 06/03/2015 0913   BILITOT 0.33 06/03/2015 0913    INo results found for: SPEP, UPEP  Lab Results  Component Value Date   WBC 6.9 06/10/2015   NEUTROABS 4.4 06/10/2015   HGB 13.1 06/10/2015   HCT 39.7 06/10/2015   MCV 88.6 06/10/2015   PLT 255 06/10/2015      Chemistry      Component Value Date/Time   NA 142 06/03/2015 0913   K 4.3 06/03/2015 0913   CO2 24 06/03/2015 0913   BUN 9.4 06/03/2015 0913   CREATININE 1.1 06/03/2015 0913      Component Value Date/Time   CALCIUM 10.3 06/03/2015 0913   ALKPHOS 64 06/03/2015 0913   AST 16 06/03/2015 0913   ALT 21 06/03/2015 0913   BILITOT 0.33 06/03/2015 0913       No results found for: LABCA2  No components found for: LABCA125  No results for input(s): INR in the last 168 hours.  Urinalysis No results found for: COLORURINE, APPEARANCEUR, LABSPEC, PHURINE, GLUCOSEU, HGBUR, BILIRUBINUR, KETONESUR, PROTEINUR, UROBILINOGEN, NITRITE, LEUKOCYTESUR   ELIGIBLE FOR AVAILABLE RESEARCH PROTOCOL: no  STUDIES: No results found.  ASSESSMENT: 75 y.o. [facility] woman  status post left breast biopsy 02/27/2015 for a clinical mT1-3 N1 invasive ductal carcinoma, grade 2, strongly estrogen and progesterone receptor positive, HER-2 amplified, with an MIB-1 of 20%.   (a) follow-up ultrasound of the left axilla 04/04/2015 found no abnormal lymph node to  biopsy  (1) bilateral breast MRI 03/25/2015 shows, in addition to a large area of non-masslike enhancement in the left breast, a 0.7 cm mass in the lower outer quadrant of the contralateral, right breast; biopsy of both these areas was performed 04/02/2015, showing  (a) left breast upper outer quadrant: atypical ductal hyperplasia  (b) right breast lower  outer quadrant: a clinical T1b N0, stage IA invasive ductal carcinoma, estrogen and progesterone receptor positive, HER-2 not amplified, with an Mib-1 of 4%  (2)  neoadjuvant chemotherapy consisting of weekly paclitaxel 12 together with weekly trastuzumab started 04/29/2015  (a) aclitaxel discontinued after 4 cycles because of neuropathy  (b) carboplatin/gemcitabine substituted, starting 06/10/2015  (3) trastuzumab will be continued every 3 weeks for an additional 9 months after completion of chemotherapy  (a) echo 04/25/2015 shows an ejection fraction of 55-60%   (4) definitive surgery to follow chemotherapy  (5) adjuvant radiation therapy to follow surgery  (6) tamoxifen to start at the completion of local treatment  (a) the patient has been asked to discontinue Estring as of 05/05/2015, to be resumed when tamoxifen is started   PLAN: Isabella Cook looks and feels well today. The labs were reviewed in detail and were stable. She will proceed with day 1, cycle 1 of carboplatin and gemcitabine as well as her next dose of trastuzumab as planned. We briefly reviewed the antiemetic schedule, and she understands how to utilize the zofran and compazine.   Adisen will return in 1 week for day 8 of treatment and her next cycle of trastuzumab. She understands and agrees with this plan. She knows the goal of treatment in her case is cure. She has been encouraged to call with any issues that might arise before her next visit here.   Laurie Panda, NP   06/10/2015 11:17 AM

## 2015-06-13 ENCOUNTER — Ambulatory Visit (HOSPITAL_COMMUNITY): Payer: Medicare Other

## 2015-06-17 ENCOUNTER — Ambulatory Visit (HOSPITAL_BASED_OUTPATIENT_CLINIC_OR_DEPARTMENT_OTHER): Payer: Medicare Other | Admitting: Nurse Practitioner

## 2015-06-17 ENCOUNTER — Other Ambulatory Visit (HOSPITAL_BASED_OUTPATIENT_CLINIC_OR_DEPARTMENT_OTHER): Payer: Medicare Other

## 2015-06-17 ENCOUNTER — Encounter: Payer: Self-pay | Admitting: *Deleted

## 2015-06-17 ENCOUNTER — Encounter: Payer: Self-pay | Admitting: Nurse Practitioner

## 2015-06-17 ENCOUNTER — Telehealth: Payer: Self-pay | Admitting: Nurse Practitioner

## 2015-06-17 ENCOUNTER — Ambulatory Visit (HOSPITAL_BASED_OUTPATIENT_CLINIC_OR_DEPARTMENT_OTHER): Payer: Medicare Other

## 2015-06-17 VITALS — BP 136/72 | HR 82 | Temp 98.1°F | Resp 18 | Ht 63.2 in | Wt 169.7 lb

## 2015-06-17 DIAGNOSIS — C50511 Malignant neoplasm of lower-outer quadrant of right female breast: Secondary | ICD-10-CM | POA: Diagnosis not present

## 2015-06-17 DIAGNOSIS — Z5112 Encounter for antineoplastic immunotherapy: Secondary | ICD-10-CM | POA: Diagnosis present

## 2015-06-17 DIAGNOSIS — C50412 Malignant neoplasm of upper-outer quadrant of left female breast: Secondary | ICD-10-CM

## 2015-06-17 DIAGNOSIS — K5900 Constipation, unspecified: Secondary | ICD-10-CM | POA: Diagnosis not present

## 2015-06-17 DIAGNOSIS — M8580 Other specified disorders of bone density and structure, unspecified site: Secondary | ICD-10-CM | POA: Diagnosis not present

## 2015-06-17 DIAGNOSIS — Z5111 Encounter for antineoplastic chemotherapy: Secondary | ICD-10-CM | POA: Diagnosis not present

## 2015-06-17 DIAGNOSIS — K5909 Other constipation: Secondary | ICD-10-CM

## 2015-06-17 LAB — CBC WITH DIFFERENTIAL/PLATELET
BASO%: 0.9 % (ref 0.0–2.0)
Basophils Absolute: 0 10*3/uL (ref 0.0–0.1)
EOS%: 1.8 % (ref 0.0–7.0)
Eosinophils Absolute: 0.1 10*3/uL (ref 0.0–0.5)
HCT: 36 % (ref 34.8–46.6)
HGB: 12 g/dL (ref 11.6–15.9)
LYMPH%: 31.7 % (ref 14.0–49.7)
MCH: 29.3 pg (ref 25.1–34.0)
MCHC: 33.2 g/dL (ref 31.5–36.0)
MCV: 88.3 fL (ref 79.5–101.0)
MONO#: 0.2 10*3/uL (ref 0.1–0.9)
MONO%: 5.5 % (ref 0.0–14.0)
NEUT#: 2.2 10*3/uL (ref 1.5–6.5)
NEUT%: 60.1 % (ref 38.4–76.8)
Platelets: 169 10*3/uL (ref 145–400)
RBC: 4.08 10*6/uL (ref 3.70–5.45)
RDW: 14.9 % — ABNORMAL HIGH (ref 11.2–14.5)
WBC: 3.7 10*3/uL — ABNORMAL LOW (ref 3.9–10.3)
lymph#: 1.2 10*3/uL (ref 0.9–3.3)

## 2015-06-17 LAB — COMPREHENSIVE METABOLIC PANEL
ALT: 18 U/L (ref 0–55)
ANION GAP: 7 meq/L (ref 3–11)
AST: 15 U/L (ref 5–34)
Albumin: 3.8 g/dL (ref 3.5–5.0)
Alkaline Phosphatase: 65 U/L (ref 40–150)
BUN: 14 mg/dL (ref 7.0–26.0)
CHLORIDE: 106 meq/L (ref 98–109)
CO2: 27 meq/L (ref 22–29)
CREATININE: 0.9 mg/dL (ref 0.6–1.1)
Calcium: 9.9 mg/dL (ref 8.4–10.4)
EGFR: 61 mL/min/{1.73_m2} — ABNORMAL LOW (ref >90)
Glucose: 96 mg/dl (ref 70–140)
Potassium: 4.5 mEq/L (ref 3.5–5.1)
Sodium: 140 mEq/L (ref 136–145)
Total Bilirubin: 0.36 mg/dL (ref 0.20–1.20)
Total Protein: 7.3 g/dL (ref 6.4–8.3)

## 2015-06-17 MED ORDER — DIPHENHYDRAMINE HCL 25 MG PO CAPS
ORAL_CAPSULE | ORAL | Status: AC
  Filled 2015-06-17: qty 1

## 2015-06-17 MED ORDER — SODIUM CHLORIDE 0.9% FLUSH
10.0000 mL | INTRAVENOUS | Status: DC | PRN
  Administered 2015-06-17: 10 mL
  Filled 2015-06-17: qty 10

## 2015-06-17 MED ORDER — PALONOSETRON HCL INJECTION 0.25 MG/5ML
0.2500 mg | Freq: Once | INTRAVENOUS | Status: AC
Start: 2015-06-17 — End: 2015-06-17
  Administered 2015-06-17: 0.25 mg via INTRAVENOUS

## 2015-06-17 MED ORDER — TRASTUZUMAB CHEMO INJECTION 440 MG
2.0000 mg/kg | Freq: Once | INTRAVENOUS | Status: AC
  Administered 2015-06-17: 168 mg via INTRAVENOUS
  Filled 2015-06-17: qty 8

## 2015-06-17 MED ORDER — PALONOSETRON HCL INJECTION 0.25 MG/5ML
INTRAVENOUS | Status: AC
  Filled 2015-06-17: qty 5

## 2015-06-17 MED ORDER — HEPARIN SOD (PORK) LOCK FLUSH 100 UNIT/ML IV SOLN
500.0000 [IU] | Freq: Once | INTRAVENOUS | Status: AC | PRN
  Administered 2015-06-17: 500 [IU]
  Filled 2015-06-17: qty 5

## 2015-06-17 MED ORDER — ACETAMINOPHEN 325 MG PO TABS
650.0000 mg | ORAL_TABLET | Freq: Once | ORAL | Status: AC
  Administered 2015-06-17: 650 mg via ORAL

## 2015-06-17 MED ORDER — SODIUM CHLORIDE 0.9 % IV SOLN
Freq: Once | INTRAVENOUS | Status: AC
  Administered 2015-06-17: 12:00:00 via INTRAVENOUS

## 2015-06-17 MED ORDER — DIPHENHYDRAMINE HCL 50 MG/ML IJ SOLN
INTRAMUSCULAR | Status: AC
  Filled 2015-06-17: qty 1

## 2015-06-17 MED ORDER — CARBOPLATIN CHEMO INJECTION 450 MG/45ML
174.2000 mg | Freq: Once | INTRAVENOUS | Status: AC
  Administered 2015-06-17: 170 mg via INTRAVENOUS
  Filled 2015-06-17: qty 17

## 2015-06-17 MED ORDER — ACETAMINOPHEN 325 MG PO TABS
ORAL_TABLET | ORAL | Status: AC
  Filled 2015-06-17: qty 2

## 2015-06-17 MED ORDER — DIPHENHYDRAMINE HCL 50 MG/ML IJ SOLN
25.0000 mg | Freq: Once | INTRAMUSCULAR | Status: AC
  Administered 2015-06-17: 25 mg via INTRAVENOUS

## 2015-06-17 MED ORDER — SODIUM CHLORIDE 0.9 % IV SOLN
10.0000 mg | Freq: Once | INTRAVENOUS | Status: AC
  Administered 2015-06-17: 10 mg via INTRAVENOUS
  Filled 2015-06-17: qty 1

## 2015-06-17 MED ORDER — SODIUM CHLORIDE 0.9 % IV SOLN
1000.0000 mg/m2 | Freq: Once | INTRAVENOUS | Status: AC
  Administered 2015-06-17: 1862 mg via INTRAVENOUS
  Filled 2015-06-17: qty 48.97

## 2015-06-17 NOTE — Telephone Encounter (Signed)
appt made and avs printed °

## 2015-06-17 NOTE — Progress Notes (Signed)
Isabella Cook  [phone]) [phone] Fax:(336) [phone]   ID: Etheline Geppert DOB: 25-Sep-1940  MR#: [id number]  STM#:[id number]  Patient Care Team: [name], FNP as PCP - General (Family Medicine) [name], MD as Consulting Physician (Oncology) [name], MD as Consulting Physician (General Surgery) [name], NP as Nurse Practitioner (Hematology and Oncology) [name], MD as Consulting Physician (Oncology) Thea Silversmith, MD as Consulting Physician (Radiation Oncology) [name], MD as Consulting Physician (Neurosurgery) [name], MD as Referring Physician (Otolaryngology) [name], MD as Consulting Physician (Cardiology) PCP: [name], FNP OTHER MD:  CHIEF COMPLAINT: HER-2 positive breast cancer, bilateral beast cancers  CURRENT TREATMENT: Paclitaxel, trastuzumab  BREAST CANCER HISTORY:   From the original intake note:  Belia had routine screening mammography at [facility] suggesting an area of indeterminate microcalcifications in the upper outer quadrant of the left breast measuring 8 mm. She was referred for left breast stereotactic biopsy performed at the [facility] 02/27/2015. This showed ([id number]) invasive ductal carcinoma, grade 2, estrogen receptor 100% positive, progesterone receptor 100% positive, both with strong staining intensity, with an MIB-1 of 20%, and HER-2 amplified with a signals ratio of 2.53, the number per cell being 2.65. 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  On 03/25/2015 the patient underwent bilateral breast MRI, which showed the breast density to be category B. this showed, in the left breast, an area of non-masslike enhancement measuring 7.8 cm. Associated with this were 2 adjacent irregular masses measuring 1.7 and 1.4 cm. The masses were 5 cm anterior and superior to the biopsy clip. There was a cortically thickened left axillary lymph node  noted.  Also in the right breast there was a 7 mm irregular enhancing mass at the 6:30 o'clock position.  Her subsequent history is as detailed below    INTERVAL HISTORY: [name] returns today for follow-up of her HER-2 positive breast cancer, accompanied by her husband [name]. She is due for her next dose of weekly trastuzumab as well as day 8, cycle 1 of carboplatin and gemcitabine.   REVIEW OF SYSTEMS: Han had some joint pain this past week, but didn't take anything because she wanted to check with [place] first. She will use NSAIDs in the future. She struggled with moderate constipation, despite stool softeners. She ended up having to use magnesium citrate and another laxative to have a good bowel movement. Her appetite is good but she has lost a couple pounds. She denies mouth sores or rashes. 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 She denies fevers, chills, nausea, or vomiting. A detailed review of systems is otherwise stable.  PAST MEDICAL HISTORY: Past Medical History  Diagnosis Date  . GERD (gastroesophageal reflux disease)   . Arthritis     wrists and knees  . Cancer ([place]) 03-2015    left breast  . Multiple food allergies   . Urinary tract infection   . History of hiatal hernia   . History of jaundice as a child     PAST SURGICAL HISTORY: Past Surgical History  Procedure Laterality Date  . Abdominal hysterectomy    . Cholecystectomy    . Appendectomy    . Tubal ligation    . Back surgery      lumbar  . Hernia repair      UHR  . Cervical disc surgery  2016  . Portacath placement Right 04/22/2015    Procedure: INSERTION PORT-A-CATH WITH [place];  Surgeon: [name], MD;  Location: [facility] OR;  Service: General;  Laterality: Right;    FAMILY HISTORY No family history on file. The patient's father had a history of pseudo-bulbar pulse 11 and died from a stroke at age 77. The patient's mother died at age 71 from complications of emphysema. The patient had no brothers, 2  sisters. There is no history of cancer in the family and specifically no history of breast or ovarian cancer.  GYNECOLOGIC HISTORY:  No LMP recorded. Patient has had a hysterectomy. Menarche age 24, first live birth age 41. She is GX P2. She underwent a hysterectomy with left salpingo-oophorectomy in 1984. She received hormone replacement for approximately 15 years, until 2005.  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 Her husband [name] is a retired Social research officer, government. Daughter [name] lives in [place] where she is a housewife, homeschooling her children. Son Tayllor Breitenstein lives in [place] and works in Chartered certified accountant. The patient has 2 grandchildren. She is not a church attender    ADVANCED DIRECTIVES: In place   HEALTH MAINTENANCE: Social History  Substance Use Topics  . Smoking status: Never Smoker   . Smokeless tobacco: Not on file  . Alcohol Use: Yes     Comment: occ 1 every 2-3 months     Colonoscopy:2014  PAP:  Bone density: 2017 [facility]//osteopenia   Lipid panel:  Allergies  Allergen Reactions  . Dairy Aid [Lactase] Anaphylaxis     Takes claritin every day and benadryl, if needed, to prevent anaphylaxis   . Eggs Or Egg-Derived Products Anaphylaxis    Nasal stuffiness, Takes claritin every day and benadryl, if needed, to prevent anaphylaxis  . Gelatin (Bovine) [Beef Extract] Anaphylaxis    Muscle pain,  Takes claritin every day and benadryl, if needed, to prevent anaphylaxis   . Gluten Meal Anaphylaxis and Diarrhea    Takes claritin every day and benadryl, if needed, to prevent anaphylaxis   . Lambs Quarters Anaphylaxis    ALL "mammal" MEAT per pt -- Muscle pain  Can eat chicken, fish, [place]  . Milk-Related Compounds Anaphylaxis    Nasal stuffiness Cheese (mozzarella, swiss, cheddar, cottage)  . Pork Allergy Anaphylaxis    ALL "mammal" MEAT per pt -- Muscle pain   Can eat chicken, fish, [place]  . Wheat Bran Anaphylaxis and Diarrhea     Takes claritin every day and benadryl, if needed, to prevent anaphylaxis   . Whey Anaphylaxis and Diarrhea     Takes claritin every day and benadryl, if needed, to prevent anaphylaxis   . Yeast Anaphylaxis     Takes claritin every day and benadryl, if needed, to prevent anaphylaxis  . Soy Allergy Diarrhea  . Almond Oil Nausea And Vomiting    Current Outpatient Prescriptions  Medication Sig Dispense Refill  . doxycycline (VIBRAMYCIN) 100 MG capsule Take 100 mg by mouth 2 (two) times daily.    Marland Kitchen liothyronine (CYTOMEL) 5 MCG tablet Take 5 mcg by mouth daily.    Marland Kitchen amoxicillin (AMOXIL) 250 MG capsule Take 250 mg by mouth 2 (two) times daily.  0  . calcium carbonate (TUMS - DOSED IN MG ELEMENTAL CALCIUM) 500 MG chewable tablet Chew 500 mg by mouth daily as needed for indigestion or heartburn.     . diphenhydrAMINE (SOMINEX) 25 MG tablet Take 25 mg by mouth at bedtime as needed. Reported on 05/20/2015    . famotidine (PEPCID) 20 MG tablet Take  20 mg by mouth daily.     Marland Kitchen loratadine (CLARITIN) 10 MG tablet Take 10 mg by mouth daily.    . Magnesium Citrate 100 MG TABS Take 200 mg by mouth daily.    . Methylcellulose, Laxative, (CITRUCEL) 500 MG TABS Take 2,000 mg by mouth 2 (two) times daily.    . naproxen sodium (ANAPROX) 220 MG tablet Take 440 mg by mouth 3 (three) times daily with meals.    Vladimir Faster Glycol-Propyl Glycol (SYSTANE ULTRA) 0.4-0.3 % SOLN Place 1 drop into both eyes daily as needed (dry eyes).    . Probiotic Product (ALIGN PO) Take 1 tablet by mouth daily.     Marland Kitchen UNABLE TO FIND Place 2 Squirts into the nose 4 (four) times daily. Gentamicin 0.025% Mupirocin 0.2%    . VITAMIN D-VITAMIN K PO Take 1 capsule by mouth daily. Reported on 04/15/2015     No current facility-administered medications for this visit.   Facility-Administered Medications Ordered in Other Visits  Medication Dose Route Frequency Provider  Last Rate Last Dose  . CARBOplatin (PARAPLATIN) 170 mg in sodium chloride 0.9 % 100 mL chemo infusion  170 mg Intravenous Once [name], MD      . dexamethasone (DECADRON) 10 mg in sodium chloride 0.9 % 50 mL IVPB  10 mg Intravenous Once [name], MD   10 mg at 06/17/15 1248  . gemcitabine (GEMZAR) 1,862 mg in sodium chloride 0.9 % 100 mL chemo infusion  1,000 mg/m2 (Treatment Plan Actual) Intravenous Once [name], MD      . heparin lock flush 100 unit/mL  500 Units Intracatheter Once PRN [name], MD      . sodium chloride flush (NS) 0.9 % injection 10 mL  10 mL Intracatheter PRN [name], MD      . trastuzumab (HERCEPTIN) 168 mg in sodium chloride 0.9 % 250 mL chemo infusion  2 mg/kg (Treatment Plan Actual) Intravenous Once [name], MD        OBJECTIVE: Middle-aged white woman in no acute distress Filed Vitals:   06/17/15 1136  BP: 136/72  Pulse: 82  Temp: 98.1 F (36.7 C)  Resp: 18     Body mass index is 29.88 kg/(m^2).    ECOG FS:1 - Symptomatic but completely ambulatory  Skin: warm, dry  HEENT: sclerae anicteric, conjunctivae pink, oropharynx clear. No thrush or mucositis.  Lymph Nodes: No cervical or supraclavicular lymphadenopathy  Lungs: clear to auscultation bilaterally, no rales, wheezes, or rhonci  Heart: regular rate and rhythm  Abdomen: round, soft, non tender, positive bowel sounds  Musculoskeletal: No focal spinal tenderness, no peripheral edema  Neuro: non focal, well oriented, positive affect  Breasts: deferred  LAB RESULTS:  CMP     Component Value Date/Time   NA 140 06/17/2015 1112   K 4.5 06/17/2015 1112   CO2 27 06/17/2015 1112   GLUCOSE 96 06/17/2015 1112   BUN 14.0 06/17/2015 1112   CREATININE 0.9 06/17/2015 1112   CALCIUM 9.9 06/17/2015 1112   PROT 7.3 06/17/2015 1112   ALBUMIN 3.8 06/17/2015 1112   AST 15 06/17/2015 1112   ALT 18 06/17/2015 1112   ALKPHOS 65 06/17/2015 1112   BILITOT 0.36  06/17/2015 1112    INo results found for: SPEP, UPEP  Lab Results  Component Value Date   WBC 3.7* 06/17/2015   NEUTROABS 2.2 06/17/2015   HGB 12.0 06/17/2015   HCT 36.0 06/17/2015   MCV 88.3  06/17/2015   PLT 169 06/17/2015      Chemistry      Component Value Date/Time   NA 140 06/17/2015 1112   K 4.5 06/17/2015 1112   CO2 27 06/17/2015 1112   BUN 14.0 06/17/2015 1112   CREATININE 0.9 06/17/2015 1112      Component Value Date/Time   CALCIUM 9.9 06/17/2015 1112   ALKPHOS 65 06/17/2015 1112   AST 15 06/17/2015 1112   ALT 18 06/17/2015 1112   BILITOT 0.36 06/17/2015 1112       No results found for: LABCA2  No components found for: LABCA125  No results for input(s): INR in the last 168 hours.  Urinalysis No results found for: COLORURINE, APPEARANCEUR, LABSPEC, PHURINE, GLUCOSEU, HGBUR, BILIRUBINUR, KETONESUR, PROTEINUR, UROBILINOGEN, NITRITE, LEUKOCYTESUR   ELIGIBLE FOR AVAILABLE RESEARCH PROTOCOL: no  STUDIES: No results found.  ASSESSMENT: 75 y.o. [facility] woman  status post left breast biopsy 02/27/2015 for a clinical mT1-3 N1 invasive ductal carcinoma, grade 2, strongly estrogen and progesterone receptor positive, HER-2 amplified, with an MIB-1 of 20%.   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  (b) right breast lower outer quadrant: a clinical T1b N0, stage IA invasive ductal carcinoma, estrogen and progesterone receptor positive, HER-2 not amplified, with an Mib-1 of 4%  (2)  neoadjuvant chemotherapy consisting of weekly paclitaxel 12 together with weekly trastuzumab started  04/29/2015  (a) 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  (b) carboplatin/gemcitabine substituted, starting 06/10/2015 (total of 4 doses [2 D1, D8 cycles] to be given)  (3) trastuzumab will be continued every 3 weeks for an additional 9 months after completion of chemotherapy  (a) echo 04/25/2015 shows an ejection fraction of 55-60%   (4) definitive surgery to follow chemotherapy  (5) adjuvant radiation therapy to follow surgery  (6) tamoxifen to start at the completion of local treatment  (a) the patient has been asked to discontinue Estring as of 05/05/2015, to be resumed when tamoxifen is started   PLAN: Shanteria is going to start taking miralax daily in conjunction with her stool softeners to manage her constipation. Otherwise she is doing well. The labs were reviewed in detail and were stable. She will proceed with day 8, cycle 1 of carboplatin and gemcitabine as well as her next dose of weekly trastuzumab as planned.  I ordered the final breast MRI to be performed during the week of 7/3 after her last dose of chemo. She still has 1 cycle of carboplatin and gemcitabine to complete.  Angella will return in 1 week for weekly trastuzumab alone. She understands and agrees with this plan. She knows the goal of treatment in her case is cure. She has been encouraged to call with any issues that might arise before her next visit here.   Laurie Panda, NP   06/17/2015 12:56 PM

## 2015-06-17 NOTE — Patient Instructions (Signed)
[facility] Discharge Instructions for Patients Receiving Chemotherapy  Today you received the following chemotherapy agents: gemzar, carboplatin, herceptin  To help prevent nausea and vomiting after your treatment, we encourage you to take your nausea medication as directed  If you develop nausea and vomiting that is not controlled by your nausea medication, call the clinic.   BELOW ARE SYMPTOMS THAT SHOULD BE REPORTED IMMEDIATELY:  *FEVER GREATER THAN 100.5 F  *CHILLS WITH OR WITHOUT FEVER  NAUSEA AND VOMITING THAT IS NOT CONTROLLED WITH YOUR NAUSEA MEDICATION  *UNUSUAL SHORTNESS OF BREATH  *UNUSUAL BRUISING OR BLEEDING  TENDERNESS IN MOUTH AND THROAT WITH OR WITHOUT PRESENCE OF ULCERS  *URINARY PROBLEMS  *BOWEL PROBLEMS  UNUSUAL RASH Items with * indicate a potential emergency and should be followed up as soon as possible.  Feel free to call the clinic you have any questions or concerns. The clinic phone number is [phone].  Please show the [place] at check-in to the Emergency Department and triage nurse.    Carboplatin injection What is this medicine? CARBOPLATIN (KAR boe pla tin) is a chemotherapy drug. It targets fast dividing cells, like cancer cells, and causes these cells to die. This medicine is used to treat ovarian cancer and many other cancers. This medicine may be used for other purposes; ask your health care provider or pharmacist if you have questions. What should I tell my health care provider before I take this medicine? They need to know if you have any of these conditions: -blood disorders -hearing problems -kidney disease -recent or ongoing radiation therapy -an unusual or allergic reaction to carboplatin, cisplatin, other chemotherapy, other medicines, foods, dyes, or preservatives -pregnant or trying to get pregnant -breast-feeding How should I use this medicine? This drug is usually given as an infusion into a  vein. It is administered in a hospital or clinic by a specially trained health care professional. Talk to your pediatrician regarding the use of this medicine in children. Special care may be needed. Overdosage: If you think you have taken too much of this medicine contact a poison control center or emergency room at once. NOTE: This medicine is only for you. Do not share this medicine with others. What if I miss a dose? It is important not to miss a dose. Call your doctor or health care professional if you are unable to keep an appointment. What may interact with this medicine? -medicines for seizures -medicines to increase blood counts like filgrastim, pegfilgrastim, sargramostim -some antibiotics like amikacin, gentamicin, neomycin, streptomycin, tobramycin -vaccines Talk to your doctor or health care professional before taking any of these medicines: -acetaminophen -aspirin -ibuprofen -ketoprofen -naproxen This list may not describe all possible interactions. Give your health care provider a list of all the medicines, herbs, non-prescription drugs, or dietary supplements you use. Also tell them if you smoke, drink alcohol, or use illegal drugs. Some items may interact with your medicine. What should I watch for while using this medicine? Your condition will be monitored carefully while you are receiving this medicine. You will need important blood work done while you are taking this medicine. This drug may make you feel generally unwell. This is not uncommon, as chemotherapy can affect healthy cells as well as cancer cells. Report any side effects. Continue your course of treatment even though you feel ill unless your doctor tells you to stop. In some cases, you may be given additional medicines to help with side effects. Follow all directions for  their use. Call your doctor or health care professional for advice if you get a fever, chills or sore throat, or other symptoms of a cold or flu.  Do not treat yourself. This drug decreases your body's ability to fight infections. Try to avoid being around people who are sick. This medicine may increase your risk to bruise or bleed. Call your doctor or health care professional if you notice any unusual bleeding. Be careful brushing and flossing your teeth or using a toothpick because you may get an infection or bleed more easily. If you have any dental work done, tell your dentist you are receiving this medicine. Avoid taking products that contain aspirin, acetaminophen, ibuprofen, naproxen, or ketoprofen unless instructed by your doctor. These medicines may hide a fever. Do not become pregnant while taking this medicine. Women should inform their doctor if they wish to become pregnant or think they might be pregnant. There is a potential for serious side effects to an unborn child. Talk to your health care professional or pharmacist for more information. Do not breast-feed an infant while taking this medicine. What side effects may I notice from receiving this medicine? Side effects that you should report to your doctor or health care professional as soon as possible: -allergic reactions like skin rash, itching or hives, swelling of the face, lips, or tongue -signs of infection - fever or chills, cough, sore throat, pain or difficulty passing urine -signs of decreased platelets or bleeding - bruising, pinpoint red spots on the skin, black, tarry stools, nosebleeds -signs of decreased red blood cells - unusually weak or tired, fainting spells, lightheadedness -breathing problems -changes in hearing -changes in vision -chest pain -high blood pressure -low blood counts - This drug may decrease the number of white blood cells, red blood cells and platelets. You may be at increased risk for infections and bleeding. -nausea and vomiting -pain, swelling, redness or irritation at the injection site -pain, tingling, numbness in the hands or  feet -problems with balance, talking, walking -trouble passing urine or change in the amount of urine Side effects that usually do not require medical attention (report to your doctor or health care professional if they continue or are bothersome): -hair loss -loss of appetite -metallic taste in the mouth or changes in taste This list may not describe all possible side effects. Call your doctor for medical advice about side effects. You may report side effects to FDA at 1-800-FDA-1088. Where should I keep my medicine? This drug is given in a hospital or clinic and will not be stored at home. NOTE: This sheet is a summary. It may not cover all possible information. If you have questions about this medicine, talk to your doctor, pharmacist, or health care provider.    2016, [employer]. (2007-03-28 14:38:05)   Gemcitabine injection What is this medicine? GEMCITABINE (jem SIT a been) is a chemotherapy drug. This medicine is used to treat many types of cancer like breast cancer, lung cancer, pancreatic cancer, and ovarian cancer. This medicine may be used for other purposes; ask your health care provider or pharmacist if you have questions. What should I tell my health care provider before I take this medicine? They need to know if you have any of these conditions: -blood disorders -infection -kidney disease -liver disease -recent or ongoing radiation therapy -an unusual or allergic reaction to gemcitabine, other chemotherapy, other medicines, foods, dyes, or preservatives -pregnant or trying to get pregnant -breast-feeding How should I use this medicine? This  drug is given as an infusion into a vein. It is administered in a hospital or clinic by a specially trained health care professional. Talk to your pediatrician regarding the use of this medicine in children. Special care may be needed. Overdosage: If you think you have taken too much of this medicine contact a poison  control center or emergency room at once. NOTE: This medicine is only for you. Do not share this medicine with others. What if I miss a dose? It is important not to miss your dose. Call your doctor or health care professional if you are unable to keep an appointment. What may interact with this medicine? -medicines to increase blood counts like filgrastim, pegfilgrastim, sargramostim -some other chemotherapy drugs like cisplatin -vaccines Talk to your doctor or health care professional before taking any of these medicines: -acetaminophen -aspirin -ibuprofen -ketoprofen -naproxen This list may not describe all possible interactions. Give your health care provider a list of all the medicines, herbs, non-prescription drugs, or dietary supplements you use. Also tell them if you smoke, drink alcohol, or use illegal drugs. Some items may interact with your medicine. What should I watch for while using this medicine? Visit your doctor for checks on your progress. This drug may make you feel generally unwell. This is not uncommon, as chemotherapy can affect healthy cells as well as cancer cells. Report any side effects. Continue your course of treatment even though you feel ill unless your doctor tells you to stop. In some cases, you may be given additional medicines to help with side effects. Follow all directions for their use. Call your doctor or health care professional for advice if you get a fever, chills or sore throat, or other symptoms of a cold or flu. Do not treat yourself. This drug decreases your body's ability to fight infections. Try to avoid being around people who are sick. This medicine may increase your risk to bruise or bleed. Call your doctor or health care professional if you notice any unusual bleeding. Be careful brushing and flossing your teeth or using a toothpick because you may get an infection or bleed more easily. If you have any dental work done, tell your dentist you are  receiving this medicine. Avoid taking products that contain aspirin, acetaminophen, ibuprofen, naproxen, or ketoprofen unless instructed by your doctor. These medicines may hide a fever. Women should inform their doctor if they wish to become pregnant or think they might be pregnant. There is a potential for serious side effects to an unborn child. Talk to your health care professional or pharmacist for more information. Do not breast-feed an infant while taking this medicine. What side effects may I notice from receiving this medicine? Side effects that you should report to your doctor or health care professional as soon as possible: -allergic reactions like skin rash, itching or hives, swelling of the face, lips, or tongue -low blood counts - this medicine may decrease the number of white blood cells, red blood cells and platelets. You may be at increased risk for infections and bleeding. -signs of infection - fever or chills, cough, sore throat, pain or difficulty passing urine -signs of decreased platelets or bleeding - bruising, pinpoint red spots on the skin, black, tarry stools, blood in the urine -signs of decreased red blood cells - unusually weak or tired, fainting spells, lightheadedness -breathing problems -chest pain -mouth sores -nausea and vomiting -pain, swelling, redness at site where injected -pain, tingling, numbness in the hands or feet -stomach  pain -swelling of ankles, feet, hands -unusual bleeding Side effects that usually do not require medical attention (report to your doctor or health care professional if they continue or are bothersome): -constipation -diarrhea -hair loss -loss of appetite -stomach upset This list may not describe all possible side effects. Call your doctor for medical advice about side effects. You may report side effects to FDA at 1-800-FDA-1088. Where should I keep my medicine? This drug is given in a hospital or clinic and will not be stored  at home. NOTE: This sheet is a summary. It may not cover all possible information. If you have questions about this medicine, talk to your doctor, pharmacist, or health care provider.    2016, [employer]. (2007-05-02 18:45:54)

## 2015-06-24 ENCOUNTER — Other Ambulatory Visit: Payer: Medicare Other

## 2015-06-24 ENCOUNTER — Ambulatory Visit: Payer: Medicare Other | Admitting: Oncology

## 2015-06-24 ENCOUNTER — Ambulatory Visit: Payer: Medicare Other

## 2015-06-25 ENCOUNTER — Encounter: Payer: Self-pay | Admitting: Oncology

## 2015-06-25 ENCOUNTER — Ambulatory Visit (HOSPITAL_BASED_OUTPATIENT_CLINIC_OR_DEPARTMENT_OTHER): Payer: Medicare Other | Admitting: Oncology

## 2015-06-25 ENCOUNTER — Other Ambulatory Visit (HOSPITAL_BASED_OUTPATIENT_CLINIC_OR_DEPARTMENT_OTHER): Payer: Medicare Other

## 2015-06-25 ENCOUNTER — Ambulatory Visit (HOSPITAL_BASED_OUTPATIENT_CLINIC_OR_DEPARTMENT_OTHER): Payer: Medicare Other

## 2015-06-25 VITALS — BP 126/68 | HR 96 | Temp 98.2°F | Resp 17 | Ht 63.2 in | Wt 171.1 lb

## 2015-06-25 DIAGNOSIS — C50511 Malignant neoplasm of lower-outer quadrant of right female breast: Secondary | ICD-10-CM

## 2015-06-25 DIAGNOSIS — D709 Neutropenia, unspecified: Secondary | ICD-10-CM | POA: Diagnosis not present

## 2015-06-25 DIAGNOSIS — M8580 Other specified disorders of bone density and structure, unspecified site: Secondary | ICD-10-CM | POA: Diagnosis not present

## 2015-06-25 DIAGNOSIS — C50412 Malignant neoplasm of upper-outer quadrant of left female breast: Secondary | ICD-10-CM

## 2015-06-25 DIAGNOSIS — Z5112 Encounter for antineoplastic immunotherapy: Secondary | ICD-10-CM

## 2015-06-25 DIAGNOSIS — D696 Thrombocytopenia, unspecified: Secondary | ICD-10-CM

## 2015-06-25 LAB — CBC WITH DIFFERENTIAL/PLATELET
BASO%: 0 % (ref 0.0–2.0)
BASOS ABS: 0 10*3/uL (ref 0.0–0.1)
EOS%: 4.9 % (ref 0.0–7.0)
Eosinophils Absolute: 0.1 10*3/uL (ref 0.0–0.5)
HCT: 32.6 % — ABNORMAL LOW (ref 34.8–46.6)
HEMOGLOBIN: 10.9 g/dL — AB (ref 11.6–15.9)
LYMPH%: 56.1 % — AB (ref 14.0–49.7)
MCH: 29.6 pg (ref 25.1–34.0)
MCHC: 33.4 g/dL (ref 31.5–36.0)
MCV: 88.6 fL (ref 79.5–101.0)
MONO#: 0 10*3/uL — ABNORMAL LOW (ref 0.1–0.9)
MONO%: 1.6 % (ref 0.0–14.0)
NEUT#: 0.5 10*3/uL — CL (ref 1.5–6.5)
NEUT%: 37.4 % — AB (ref 38.4–76.8)
Platelets: 45 10*3/uL — ABNORMAL LOW (ref 145–400)
RBC: 3.68 10*6/uL — AB (ref 3.70–5.45)
RDW: 13.8 % (ref 11.2–14.5)
WBC: 1.2 10*3/uL — ABNORMAL LOW (ref 3.9–10.3)
lymph#: 0.7 10*3/uL — ABNORMAL LOW (ref 0.9–3.3)

## 2015-06-25 MED ORDER — DIPHENHYDRAMINE HCL 50 MG/ML IJ SOLN
INTRAMUSCULAR | Status: AC
  Filled 2015-06-25: qty 1

## 2015-06-25 MED ORDER — SODIUM CHLORIDE 0.9 % IV SOLN
Freq: Once | INTRAVENOUS | Status: AC
  Administered 2015-06-25: 12:00:00 via INTRAVENOUS

## 2015-06-25 MED ORDER — TRASTUZUMAB CHEMO INJECTION 440 MG
2.0000 mg/kg | Freq: Once | INTRAVENOUS | Status: AC
  Administered 2015-06-25: 168 mg via INTRAVENOUS
  Filled 2015-06-25: qty 8

## 2015-06-25 MED ORDER — SODIUM CHLORIDE 0.9% FLUSH
10.0000 mL | INTRAVENOUS | Status: DC | PRN
  Administered 2015-06-25: 10 mL
  Filled 2015-06-25: qty 10

## 2015-06-25 MED ORDER — ACETAMINOPHEN 325 MG PO TABS
ORAL_TABLET | ORAL | Status: AC
  Filled 2015-06-25: qty 2

## 2015-06-25 MED ORDER — ACETAMINOPHEN 325 MG PO TABS
650.0000 mg | ORAL_TABLET | Freq: Once | ORAL | Status: AC
  Administered 2015-06-25: 650 mg via ORAL

## 2015-06-25 MED ORDER — DIPHENHYDRAMINE HCL 50 MG/ML IJ SOLN
25.0000 mg | Freq: Once | INTRAMUSCULAR | Status: AC
  Administered 2015-06-25: 25 mg via INTRAVENOUS

## 2015-06-25 MED ORDER — HEPARIN SOD (PORK) LOCK FLUSH 100 UNIT/ML IV SOLN
500.0000 [IU] | Freq: Once | INTRAVENOUS | Status: AC | PRN
  Administered 2015-06-25: 500 [IU]
  Filled 2015-06-25: qty 5

## 2015-06-25 NOTE — Progress Notes (Signed)
[place]  [phone]) [phone] Fax:(336) [phone]   ID: Isabella Cook DOB: 1940/09/09  MR#: [id number]  UKG#:[id number]  Patient Care Team: [name], FNP as PCP - General (Family Medicine) [name], MD as Consulting Physician (Oncology) [name], MD as Consulting Physician (General Surgery) [name], NP as Nurse Practitioner (Hematology and Oncology) [name], MD as Consulting Physician (Oncology) Thea Silversmith, MD as Consulting Physician (Radiation Oncology) [name], MD as Consulting Physician (Neurosurgery) [name], MD as Referring Physician (Otolaryngology) [name], MD as Consulting Physician (Cardiology) PCP: [name], FNP OTHER MD:  CHIEF COMPLAINT: HER-2 positive breast cancer, bilateral beast cancers  CURRENT TREATMENT: Paclitaxel, trastuzumab  BREAST CANCER HISTORY:   From the original intake note:  Isabella Cook had routine screening mammography at [facility] suggesting an area of indeterminate microcalcifications in the upper outer quadrant of the left breast measuring 8 mm. She was referred for left breast stereotactic biopsy performed at the [place] 02/27/2015. This showed ([id number]) invasive ductal carcinoma, grade 2, estrogen receptor 100% positive, progesterone receptor 100% positive, both with strong staining intensity, with an MIB-1 of 20%, and HER-2 amplified with a signals ratio of 2.53, the number per cell being 2.65. 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  On 03/25/2015 the patient underwent bilateral breast MRI, which showed the breast density to be category B. this showed, in the left breast, an area of non-masslike enhancement measuring 7.8 cm. Associated with this were 2 adjacent irregular masses measuring 1.7 and 1.4 cm. The masses were 5 cm anterior and superior to the biopsy clip. There was a cortically thickened left axillary lymph node  noted.  Also in the right breast there was a 7 mm irregular enhancing mass at the 6:30 o'clock position.  Her subsequent history is as detailed below    INTERVAL HISTORY: Isabella Cook returns today for follow-up of her HER-2 positive breast cancer, accompanied by her sister. She is due for her next dose of weekly trastuzumab only today.   REVIEW OF SYSTEMS: Isabella Cook has been doing well over the past week. She does report some mild nausea due to the doxycycline that she is taking. She is not having any vomiting however. The patient denies fevers, chills, chest pain, shortness of breath, abdominal pain. The patient was having issues with constipation at her last visit and was advised to take MiraLAX. She developed loose stools without starting the MiraLAX and has not taken this at all. Bowels are better today and she is not having any significant diarrhea. Her appetite is good and weight stable. She denies mouth sores or rashes. 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 A detailed review of systems is otherwise stable.  PAST MEDICAL HISTORY: Past Medical History  Diagnosis Date  . GERD (gastroesophageal reflux disease)   . Arthritis     wrists and knees  . Cancer ([place]) 03-2015    left breast  . Multiple food allergies   . Urinary tract infection   . History of hiatal hernia   . History of jaundice as a child     PAST SURGICAL HISTORY: Past Surgical History  Procedure Laterality Date  . Abdominal hysterectomy    . Cholecystectomy    . Appendectomy    . Tubal ligation    . Back surgery      lumbar  . Hernia repair      UHR  . Cervical disc surgery  2016  . Portacath placement Right 04/22/2015  Procedure: INSERTION PORT-A-CATH WITH [place];  Surgeon: [name], MD;  Location: [place];  Service: General;  Laterality: Right;    FAMILY HISTORY History reviewed. No pertinent family history. The patient's father had a history of pseudo-bulbar pulse 46 and died from a stroke at age 52. The  patient's mother died at age 28 from complications of emphysema. The patient had no brothers, 2 sisters. There is no history of cancer in the family and specifically no history of breast or ovarian cancer.  GYNECOLOGIC HISTORY:  No LMP recorded. Patient has had a hysterectomy. Menarche age 30, first live birth age 59. She is GX P2. She underwent a hysterectomy with left salpingo-oophorectomy in 1984. She received hormone replacement for approximately 15 years, until 2005.  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 Her husband Isabella Cook is a retired Social research officer, government. Daughter Isabella Cook lives in [place] where she is a housewife, homeschooling her children. Son Isabella Cook lives in [place] and works in Chartered certified accountant. The patient has 2 grandchildren. She is not a church attender    ADVANCED DIRECTIVES: In place   HEALTH MAINTENANCE: Social History  Substance Use Topics  . Smoking status: Never Smoker   . Smokeless tobacco: None  . Alcohol Use: Yes     Comment: occ 1 every 2-3 months     Colonoscopy:2014  PAP:  Bone density: 2017 [facility]//osteopenia   Lipid panel:  Allergies  Allergen Reactions  . Dairy Aid [Lactase] Anaphylaxis     Takes claritin every day and benadryl, if needed, to prevent anaphylaxis   . Eggs Or Egg-Derived Products Anaphylaxis    Nasal stuffiness, Takes claritin every day and benadryl, if needed, to prevent anaphylaxis  . Gelatin (Bovine) [Beef Extract] Anaphylaxis    Muscle pain,  Takes claritin every day and benadryl, if needed, to prevent anaphylaxis   . Gluten Meal Anaphylaxis and Diarrhea    Takes claritin every day and benadryl, if needed, to prevent anaphylaxis   . Lambs Quarters Anaphylaxis    ALL "mammal" MEAT per pt -- Muscle pain  Can eat chicken, fish, [place]  . Milk-Related Compounds Anaphylaxis    Nasal stuffiness Cheese (mozzarella, swiss,  cheddar, cottage)  . Pork Allergy Anaphylaxis    ALL "mammal" MEAT per pt -- Muscle pain  Can eat chicken, fish, [place]  . Wheat Bran Anaphylaxis and Diarrhea     Takes claritin every day and benadryl, if needed, to prevent anaphylaxis   . Whey Anaphylaxis and Diarrhea     Takes claritin every day and benadryl, if needed, to prevent anaphylaxis   . Yeast Anaphylaxis     Takes claritin every day and benadryl, if needed, to prevent anaphylaxis  . Soy Allergy Diarrhea  . Almond Oil Nausea And Vomiting    Current Outpatient Prescriptions  Medication Sig Dispense Refill  . calcium carbonate (TUMS - DOSED IN MG ELEMENTAL CALCIUM) 500 MG chewable tablet Chew 500 mg by mouth daily as needed for indigestion or heartburn.     . diphenhydrAMINE (SOMINEX) 25 MG tablet Take 25 mg by mouth at bedtime as needed. Reported on 05/20/2015    . doxycycline (VIBRAMYCIN) 100 MG capsule Take 100 mg by mouth 2 (two) times daily.    . famotidine (PEPCID) 20 MG tablet Take 20 mg by mouth daily.     Marland Kitchen liothyronine (CYTOMEL) 5 MCG tablet Take 5 mcg by mouth daily.    Marland Kitchen  loratadine (CLARITIN) 10 MG tablet Take 10 mg by mouth daily.    . Magnesium Citrate 100 MG TABS Take 200 mg by mouth daily.    . Methylcellulose, Laxative, (CITRUCEL) 500 MG TABS Take 2,000 mg by mouth 2 (two) times daily.    . naproxen sodium (ANAPROX) 220 MG tablet Take 440 mg by mouth 3 (three) times daily with meals.    Vladimir Faster Glycol-Propyl Glycol (SYSTANE ULTRA) 0.4-0.3 % SOLN Place 1 drop into both eyes daily as needed (dry eyes).    . Probiotic Product (ALIGN PO) Take 1 tablet by mouth daily.     Marland Kitchen UNABLE TO FIND Place 2 Squirts into the nose 4 (four) times daily. Gentamicin 0.025% Mupirocin 0.2%    . VITAMIN D-VITAMIN K PO Take 1 capsule by mouth daily. Reported on 04/15/2015     No current facility-administered medications for this visit.   Facility-Administered Medications Ordered in Other Visits  Medication Dose Route Frequency  Provider Last Rate Last Dose  . sodium chloride flush (NS) 0.9 % injection 10 mL  10 mL Intracatheter PRN [name], MD   10 mL at 06/25/15 1253    OBJECTIVE: Middle-aged white woman in no acute distress Filed Vitals:   06/25/15 0942  BP: 126/68  Pulse: 96  Temp: 98.2 F (36.8 C)  Resp: 17     Body mass index is 30.13 kg/(m^2).    ECOG FS:1 - Symptomatic but completely ambulatory  Skin: warm, dry  HEENT: sclerae anicteric, conjunctivae pink, oropharynx clear. No thrush or mucositis.  Lymph Nodes: No cervical or supraclavicular lymphadenopathy  Lungs: clear to auscultation bilaterally, no rales, wheezes, or rhonci  Heart: regular rate and rhythm  Abdomen: round, soft, non tender, positive bowel sounds  Musculoskeletal: No focal spinal tenderness, no peripheral edema  Neuro: non focal, well oriented, positive affect  Breasts: deferred  LAB RESULTS:  CMP     Component Value Date/Time   NA 140 06/17/2015 1112   K 4.5 06/17/2015 1112   CO2 27 06/17/2015 1112   GLUCOSE 96 06/17/2015 1112   BUN 14.0 06/17/2015 1112   CREATININE 0.9 06/17/2015 1112   CALCIUM 9.9 06/17/2015 1112   PROT 7.3 06/17/2015 1112   ALBUMIN 3.8 06/17/2015 1112   AST 15 06/17/2015 1112   ALT 18 06/17/2015 1112   ALKPHOS 65 06/17/2015 1112   BILITOT 0.36 06/17/2015 1112    INo results found for: SPEP, UPEP  Lab Results  Component Value Date   WBC 1.2* 06/25/2015   NEUTROABS 0.5* 06/25/2015   HGB 10.9* 06/25/2015   HCT 32.6* 06/25/2015   MCV 88.6 06/25/2015   PLT 45* 06/25/2015      Chemistry      Component Value Date/Time   NA 140 06/17/2015 1112   K 4.5 06/17/2015 1112   CO2 27 06/17/2015 1112   BUN 14.0 06/17/2015 1112   CREATININE 0.9 06/17/2015 1112      Component Value Date/Time   CALCIUM 9.9 06/17/2015 1112   ALKPHOS 65 06/17/2015 1112   AST 15 06/17/2015 1112   ALT 18 06/17/2015 1112   BILITOT 0.36 06/17/2015 1112       No results found for: LABCA2  No  components found for: LABCA125  No results for input(s): INR in the last 168 hours.  Urinalysis No results found for: COLORURINE, APPEARANCEUR, LABSPEC, PHURINE, GLUCOSEU, HGBUR, BILIRUBINUR, KETONESUR, PROTEINUR, UROBILINOGEN, NITRITE, LEUKOCYTESUR   ELIGIBLE FOR AVAILABLE RESEARCH PROTOCOL: no  STUDIES: No results found.  ASSESSMENT: 75 y.o. [facility] woman  status post left breast biopsy 02/27/2015 for a clinical mT1-3 N1 invasive ductal carcinoma, grade 2, strongly estrogen and progesterone receptor positive, HER-2 amplified, with an MIB-1 of 20%.   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  (b) right breast lower outer quadrant: a clinical T1b N0, stage IA invasive ductal carcinoma, estrogen and progesterone receptor positive, HER-2 not amplified, with an Mib-1 of 4%  (2)  neoadjuvant chemotherapy consisting of weekly paclitaxel 12 together with weekly trastuzumab started 04/29/2015  (a) 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  (b) carboplatin/gemcitabine substituted, starting 06/10/2015 (total of 4 doses [2 D1, D8 cycles] to be given)  (3) trastuzumab will be continued every 3 weeks for an additional 9 months after completion of chemotherapy  (a) echo 04/25/2015 shows an ejection fraction of 55-60%   (4) definitive surgery to follow chemotherapy  (5) adjuvant radiation therapy to follow surgery  (6) tamoxifen to start at the completion of local treatment  (a) the patient has been asked to discontinue Estring as of 05/05/2015, to be resumed when tamoxifen is started   PLAN: Isabella Cook is  tolerated her last chemotherapy therapy well overall. Counts reviewed and she is noted to be neutropenic and thrombocytopenic. The patient is afebrile and has no evidence of bleeding. Counts reviewed with Dr. [name] and the patient will receive her weekly trastuzumab as planned. She was cautioned about neutropenic and bleeding precautions advised to call us if she develops any fevers 100.5 or higher or any bleeding.  The patient has her final breast MRI scheduled during the week of 7/3 after her last dose of chemo. She still has 1 cycle of carboplatin and gemcitabine to complete.  Isabella Cook will return in 1 week for weekly trastuzumab as well as carboplatin and gemcitabine. She understands and agrees with this plan. She knows the goal of treatment in her case is cure. She has been encouraged to call with any issues that might arise before her next visit here.   Mikey Bussing, NP   06/25/2015 3:36 PM

## 2015-06-25 NOTE — Patient Instructions (Signed)
Trastuzumab injection for infusion What is this medicine? TRASTUZUMAB (tras TOO zoo mab) is a monoclonal antibody. It is used to treat breast cancer and stomach cancer. This medicine may be used for other purposes; ask your health care provider or pharmacist if you have questions. What should I tell my health care provider before I take this medicine? They need to know if you have any of these conditions: -heart disease -heart failure -infection (especially a virus infection such as chickenpox, cold sores, or herpes) -lung or breathing disease, like asthma -recent or ongoing radiation therapy -an unusual or allergic reaction to trastuzumab, benzyl alcohol, or other medications, foods, dyes, or preservatives -pregnant or trying to get pregnant -breast-feeding How should I use this medicine? This drug is given as an infusion into a vein. It is administered in a hospital or clinic by a specially trained health care professional. Talk to your pediatrician regarding the use of this medicine in children. This medicine is not approved for use in children. Overdosage: If you think you have taken too much of this medicine contact a poison control center or emergency room at once. NOTE: This medicine is only for you. Do not share this medicine with others. What if I miss a dose? It is important not to miss a dose. Call your doctor or health care professional if you are unable to keep an appointment. What may interact with this medicine? -doxorubicin -warfarin This list may not describe all possible interactions. Give your health care provider a list of all the medicines, herbs, non-prescription drugs, or dietary supplements you use. Also tell them if you smoke, drink alcohol, or use illegal drugs. Some items may interact with your medicine. What should I watch for while using this medicine? Visit your doctor for checks on your progress. Report any side effects. Continue your course of treatment even  though you feel ill unless your doctor tells you to stop. Call your doctor or health care professional for advice if you get a fever, chills or sore throat, or other symptoms of a cold or flu. Do not treat yourself. Try to avoid being around people who are sick. You may experience fever, chills and shaking during your first infusion. These effects are usually mild and can be treated with other medicines. Report any side effects during the infusion to your health care professional. Fever and chills usually do not happen with later infusions. Do not become pregnant while taking this medicine or for 7 months after stopping it. Women should inform their doctor if they wish to become pregnant or think they might be pregnant. Women of child-bearing potential will need to have a negative pregnancy test before starting this medicine. There is a potential for serious side effects to an unborn child. Talk to your health care professional or pharmacist for more information. Do not breast-feed an infant while taking this medicine or for 7 months after stopping it. Women must use effective birth control with this medicine. What side effects may I notice from receiving this medicine? Side effects that you should report to your doctor or other health care professional as soon as possible: -breathing difficulties -chest pain or palpitations -cough -dizziness or fainting -fever or chills, sore throat -skin rash, itching or hives -swelling of the legs or ankles -unusually weak or tired Side effects that usually do not require medical attention (report to your doctor or other health care professional if they continue or are bothersome): -loss of appetite -headache -muscle aches -nausea This   list may not describe all possible side effects. Call your doctor for medical advice about side effects. You may report side effects to FDA at 1-800-FDA-1088. Where should I keep my medicine? This drug is given in a hospital  or clinic and will not be stored at home. NOTE: This sheet is a summary. It may not cover all possible information. If you have questions about this medicine, talk to your doctor, pharmacist, or health care provider.    2016, Elsevier/Gold Standard. (2014-03-29 11:49:32)   

## 2015-07-01 ENCOUNTER — Ambulatory Visit (HOSPITAL_BASED_OUTPATIENT_CLINIC_OR_DEPARTMENT_OTHER): Payer: Medicare Other

## 2015-07-01 ENCOUNTER — Other Ambulatory Visit (HOSPITAL_BASED_OUTPATIENT_CLINIC_OR_DEPARTMENT_OTHER): Payer: Medicare Other

## 2015-07-01 ENCOUNTER — Ambulatory Visit (HOSPITAL_BASED_OUTPATIENT_CLINIC_OR_DEPARTMENT_OTHER): Payer: Medicare Other | Admitting: Oncology

## 2015-07-01 VITALS — BP 136/70 | HR 79 | Temp 97.8°F | Resp 18 | Ht 63.2 in | Wt 172.4 lb

## 2015-07-01 DIAGNOSIS — C50511 Malignant neoplasm of lower-outer quadrant of right female breast: Secondary | ICD-10-CM

## 2015-07-01 DIAGNOSIS — K5900 Constipation, unspecified: Secondary | ICD-10-CM

## 2015-07-01 DIAGNOSIS — M8580 Other specified disorders of bone density and structure, unspecified site: Secondary | ICD-10-CM

## 2015-07-01 DIAGNOSIS — Z5111 Encounter for antineoplastic chemotherapy: Secondary | ICD-10-CM

## 2015-07-01 DIAGNOSIS — Z5112 Encounter for antineoplastic immunotherapy: Secondary | ICD-10-CM

## 2015-07-01 DIAGNOSIS — C50412 Malignant neoplasm of upper-outer quadrant of left female breast: Secondary | ICD-10-CM

## 2015-07-01 LAB — CBC WITH DIFFERENTIAL/PLATELET
BASO%: 1.2 % (ref 0.0–2.0)
Basophils Absolute: 0 10*3/uL (ref 0.0–0.1)
EOS%: 5.2 % (ref 0.0–7.0)
Eosinophils Absolute: 0.2 10*3/uL (ref 0.0–0.5)
HEMATOCRIT: 31.9 % — AB (ref 34.8–46.6)
HEMOGLOBIN: 10.7 g/dL — AB (ref 11.6–15.9)
LYMPH%: 38.4 % (ref 14.0–49.7)
MCH: 29.8 pg (ref 25.1–34.0)
MCHC: 33.7 g/dL (ref 31.5–36.0)
MCV: 88.4 fL (ref 79.5–101.0)
MONO#: 0.5 10*3/uL (ref 0.1–0.9)
MONO%: 15.7 % — AB (ref 0.0–14.0)
NEUT#: 1.2 10*3/uL — ABNORMAL LOW (ref 1.5–6.5)
NEUT%: 39.5 % (ref 38.4–76.8)
Platelets: 377 10*3/uL (ref 145–400)
RBC: 3.6 10*6/uL — ABNORMAL LOW (ref 3.70–5.45)
RDW: 14.7 % — AB (ref 11.2–14.5)
WBC: 3 10*3/uL — AB (ref 3.9–10.3)
lymph#: 1.2 10*3/uL (ref 0.9–3.3)

## 2015-07-01 MED ORDER — SODIUM CHLORIDE 0.9 % IV SOLN
2.0000 mg/kg | Freq: Once | INTRAVENOUS | Status: AC
  Administered 2015-07-01: 168 mg via INTRAVENOUS
  Filled 2015-07-01: qty 8

## 2015-07-01 MED ORDER — SODIUM CHLORIDE 0.9 % IV SOLN
10.0000 mg | Freq: Once | INTRAVENOUS | Status: AC
  Administered 2015-07-01: 10 mg via INTRAVENOUS
  Filled 2015-07-01: qty 1

## 2015-07-01 MED ORDER — PALONOSETRON HCL INJECTION 0.25 MG/5ML
INTRAVENOUS | Status: AC
  Filled 2015-07-01: qty 5

## 2015-07-01 MED ORDER — ACETAMINOPHEN 325 MG PO TABS
ORAL_TABLET | ORAL | Status: AC
  Filled 2015-07-01: qty 2

## 2015-07-01 MED ORDER — ACETAMINOPHEN 325 MG PO TABS
650.0000 mg | ORAL_TABLET | Freq: Once | ORAL | Status: AC
Start: 2015-07-01 — End: 2015-07-01
  Administered 2015-07-01: 650 mg via ORAL

## 2015-07-01 MED ORDER — DIPHENHYDRAMINE HCL 50 MG/ML IJ SOLN
25.0000 mg | Freq: Once | INTRAMUSCULAR | Status: AC
  Administered 2015-07-01: 25 mg via INTRAVENOUS

## 2015-07-01 MED ORDER — SODIUM CHLORIDE 0.9 % IV SOLN
1000.0000 mg/m2 | Freq: Once | INTRAVENOUS | Status: AC
  Administered 2015-07-01: 1862 mg via INTRAVENOUS
  Filled 2015-07-01: qty 48.97

## 2015-07-01 MED ORDER — HEPARIN SOD (PORK) LOCK FLUSH 100 UNIT/ML IV SOLN
500.0000 [IU] | Freq: Once | INTRAVENOUS | Status: AC | PRN
  Administered 2015-07-01: 500 [IU]
  Filled 2015-07-01: qty 5

## 2015-07-01 MED ORDER — SODIUM CHLORIDE 0.9 % IV SOLN
Freq: Once | INTRAVENOUS | Status: AC
  Administered 2015-07-01: 12:00:00 via INTRAVENOUS

## 2015-07-01 MED ORDER — DIPHENHYDRAMINE HCL 50 MG/ML IJ SOLN
INTRAMUSCULAR | Status: AC
  Filled 2015-07-01: qty 1

## 2015-07-01 MED ORDER — SODIUM CHLORIDE 0.9 % IV SOLN
174.2000 mg | Freq: Once | INTRAVENOUS | Status: AC
  Administered 2015-07-01: 170 mg via INTRAVENOUS
  Filled 2015-07-01: qty 17

## 2015-07-01 MED ORDER — SODIUM CHLORIDE 0.9% FLUSH
10.0000 mL | INTRAVENOUS | Status: DC | PRN
  Administered 2015-07-01: 10 mL
  Filled 2015-07-01: qty 10

## 2015-07-01 MED ORDER — PALONOSETRON HCL INJECTION 0.25 MG/5ML
0.2500 mg | Freq: Once | INTRAVENOUS | Status: AC
  Administered 2015-07-01: 0.25 mg via INTRAVENOUS

## 2015-07-01 MED ORDER — DIPHENHYDRAMINE HCL 25 MG PO CAPS
ORAL_CAPSULE | ORAL | Status: AC
  Filled 2015-07-01: qty 1

## 2015-07-01 NOTE — Progress Notes (Signed)
Ok to treat with ANC 1.2 today per Dr. [name].  Ok to treat without CMET per Dr. [name].

## 2015-07-01 NOTE — Patient Instructions (Signed)
[place] Discharge Instructions for Patients Receiving Chemotherapy  Today you received the following chemotherapy agents herceptin/carboplatin/gemzar  To help prevent nausea and vomiting after your treatment, we encourage you to take your nausea medication as directed   If you develop nausea and vomiting that is not controlled by your nausea medication, call the clinic.   BELOW ARE SYMPTOMS THAT SHOULD BE REPORTED IMMEDIATELY:  *FEVER GREATER THAN 100.5 F  *CHILLS WITH OR WITHOUT FEVER  NAUSEA AND VOMITING THAT IS NOT CONTROLLED WITH YOUR NAUSEA MEDICATION  *UNUSUAL SHORTNESS OF BREATH  *UNUSUAL BRUISING OR BLEEDING  TENDERNESS IN MOUTH AND THROAT WITH OR WITHOUT PRESENCE OF ULCERS  *URINARY PROBLEMS  *BOWEL PROBLEMS  UNUSUAL RASH Items with * indicate a potential emergency and should be followed up as soon as possible.  Feel free to call the clinic you have any questions or concerns. The clinic phone number is [phone].

## 2015-07-01 NOTE — Progress Notes (Signed)
[facility]  [phone]) [phone] [phone]) [phone]   ID: Isabella Cook DOB: Dec 18, 1940  MR#: [id number]  [id number]#:[id number]  Patient Care Team: Isabella Singh, FNP as PCP - General (Family Medicine) Isabella Cruel, MD as Consulting Physician (Oncology) Isabella Luna, MD as Consulting Physician (General Surgery) Isabella Cheese, NP as Nurse Practitioner (Hematology and Oncology) Isabella Cruel, MD as Consulting Physician (Oncology) Isabella Silversmith, MD as Consulting Physician (Radiation Oncology) Isabella Levine, MD as Consulting Physician (Neurosurgery) Isabella Booty, MD as Referring Physician (Otolaryngology) Isabella Artist, MD as Consulting Physician (Cardiology) PCP: Isabella Singh, FNP OTHER MD:  CHIEF COMPLAINT: HER-2 positive breast cancer, bilateral beast cancers  CURRENT TREATMENT: Paclitaxel, trastuzumab  BREAST CANCER HISTORY:   From the original intake note:  Isabella Cook had routine screening mammography at [facility] suggesting an area of indeterminate microcalcifications in the upper outer quadrant of the left breast measuring 8 mm. She was referred for left breast stereotactic biopsy performed at the [facility] 02/27/2015. This showed ([id number]) invasive ductal carcinoma, grade 2, estrogen receptor 100% positive, progesterone receptor 100% positive, both with strong staining intensity, with an MIB-1 of 20%, and HER-2 amplified with a signals ratio of 2.53, the number per cell being 2.65. 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  On 03/25/2015 the patient underwent bilateral breast MRI, which showed the breast density to be category B. this showed, in the left breast, an area of non-masslike enhancement measuring 7.8 cm. Associated with this were 2 adjacent irregular masses measuring 1.7 and 1.4 cm. The masses were 5 cm anterior and superior to the biopsy clip. There was a cortically thickened left axillary lymph node  noted.  Also in the right breast there was a 7 mm irregular enhancing mass at the 6:30 o'clock position.  Her subsequent history is as detailed below    INTERVAL HISTORY: Isabella Cook returns today for follow-up of her triple positive breast cancer, accompanied by her husband. Today is day 1 cycle 2 of her carboplatin and gemcitabine which she receives on day 1 and day 8 of each 21 day cycle. This is her final cycle of chemotherapy--next week's dose will be her final chemotherapy dose  She is also receiving trastuzumab on a weekly basis. We are going to change that to every 3 weeks beginning with next week's dose. She is tolerating that with no side effects that she is aware of   REVIEW OF SYSTEMS: Isabella Cook feels moderately tired, but she still vacuum's the house and takes walks. She likes to read and that's her main occupation right now. She has a little bit of a runny nose. Her stool habits vary from constipation to diarrhea. She is currently a little bit on the constipated side. Aside from these issues a detailed review of systems today was noncontributory  PAST MEDICAL HISTORY: Past Medical History  Diagnosis Date  . GERD (gastroesophageal reflux disease)   . Arthritis     wrists and knees  . Cancer ([place]) 03-2015    left breast  . Multiple food allergies   . Urinary tract infection   . History of hiatal hernia   . History of jaundice as a child     PAST SURGICAL HISTORY: Past Surgical History  Procedure Laterality Date  . Abdominal hysterectomy    . Cholecystectomy    . Appendectomy    . Tubal ligation    . Back surgery      lumbar  . Hernia repair  UHR  . Cervical disc surgery  2016  . Portacath placement Right 04/22/2015    Procedure: INSERTION PORT-A-CATH WITH [place];  Surgeon: Isabella Luna, MD;  Location: [facility];  Service: General;  Laterality: Right;    FAMILY HISTORY No family history on file. The patient's father had a history of pseudo-bulbar pulse 57 and died from a  stroke at age 17. The patient's mother died at age 47 from complications of emphysema. The patient had no brothers, 2 sisters. There is no history of cancer in the family and specifically no history of breast or ovarian cancer.  GYNECOLOGIC HISTORY:  No LMP recorded. Patient has had a hysterectomy. Menarche age 73, first live birth age 75. She is GX P2. She underwent a hysterectomy with left salpingo-oophorectomy in 1984. She received hormone replacement for approximately 15 years, until 2005.  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 Her husband Isabella Cook is a retired Social research officer, government. Daughter Isabella Cook lives in [place] where she is a housewife, homeschooling her children. Son Isabella Cook lives in [place] and works in Chartered certified accountant. The patient has 2 grandchildren. She is not a church attender    ADVANCED DIRECTIVES: In place   HEALTH MAINTENANCE: Social History  Substance Use Topics  . Smoking status: Never Smoker   . Smokeless tobacco: Not on file  . Alcohol Use: Yes     Comment: occ 1 every 2-3 months     Colonoscopy:2014  PAP:  Bone density: 2017 [facility]//osteopenia   Lipid panel:  Allergies  Allergen Reactions  . Dairy Aid [Lactase] Anaphylaxis     Takes claritin every day and benadryl, if needed, to prevent anaphylaxis   . Eggs Or Egg-Derived Products Anaphylaxis    Nasal stuffiness, Takes claritin every day and benadryl, if needed, to prevent anaphylaxis  . Gelatin (Bovine) [Beef Extract] Anaphylaxis    Muscle pain,  Takes claritin every day and benadryl, if needed, to prevent anaphylaxis   . Gluten Meal Anaphylaxis and Diarrhea    Takes claritin every day and benadryl, if needed, to prevent anaphylaxis   . Lambs Quarters Anaphylaxis    ALL "mammal" MEAT per pt -- Muscle pain  Can eat chicken, fish, [place]  . Milk-Related Compounds Anaphylaxis    Nasal  stuffiness Cook (mozzarella, swiss, cheddar, cottage)  . Pork Allergy Anaphylaxis    ALL "mammal" MEAT per pt -- Muscle pain  Can eat chicken, fish, [place]  . Wheat Bran Anaphylaxis and Diarrhea     Takes claritin every day and benadryl, if needed, to prevent anaphylaxis   . Whey Anaphylaxis and Diarrhea     Takes claritin every day and benadryl, if needed, to prevent anaphylaxis   . Yeast Anaphylaxis     Takes claritin every day and benadryl, if needed, to prevent anaphylaxis  . Soy Allergy Diarrhea  . Almond Oil Nausea And Vomiting    Current Outpatient Prescriptions  Medication Sig Dispense Refill  . calcium carbonate (TUMS - DOSED IN MG ELEMENTAL CALCIUM) 500 MG chewable tablet Chew 500 mg by mouth daily as needed for indigestion or heartburn.     . diphenhydrAMINE (SOMINEX) 25 MG tablet Take 25 mg by mouth at bedtime as needed. Reported on 05/20/2015    . doxycycline (VIBRAMYCIN) 100 MG capsule Take 100 mg by mouth 2 (two) times daily.    . famotidine (PEPCID) 20 MG tablet Take 20 mg by mouth daily.     Marland Kitchen  liothyronine (CYTOMEL) 5 MCG tablet Take 5 mcg by mouth daily.    Marland Kitchen loratadine (CLARITIN) 10 MG tablet Take 10 mg by mouth daily.    . Magnesium Citrate 100 MG TABS Take 200 mg by mouth daily.    . Methylcellulose, Laxative, (CITRUCEL) 500 MG TABS Take 2,000 mg by mouth 2 (two) times daily.    . naproxen sodium (ANAPROX) 220 MG tablet Take 440 mg by mouth 3 (three) times daily with meals.    Vladimir Faster Glycol-Propyl Glycol (SYSTANE ULTRA) 0.4-0.3 % SOLN Place 1 drop into both eyes daily as needed (dry eyes).    . Probiotic Product (ALIGN PO) Take 1 tablet by mouth daily.     Marland Kitchen UNABLE TO FIND Place 2 Squirts into the nose 4 (four) times daily. Gentamicin 0.025% Mupirocin 0.2%    . VITAMIN D-VITAMIN K PO Take 1 capsule by mouth daily. Reported on 04/15/2015     No current facility-administered medications for this visit.    OBJECTIVE: Middle-aged white woman Who appears stated  age 72 Vitals:   07/01/15 1044  BP: 136/70  Pulse: 79  Temp: 97.8 F (36.6 C)  Resp: 18     Body mass index is 30.36 kg/(m^2).    ECOG FS:1 - Symptomatic but completely ambulatory  Sclerae unicteric, EOMs intact Oropharynx clear and moist No cervical or supraclavicular adenopathy Lungs no rales or rhonchi Heart regular rate and rhythm Abd soft, nontender, positive bowel sounds MSK no focal spinal tenderness, no upper extremity lymphedema Neuro: nonfocal, well oriented, appropriate affect Breasts:  Deferred   LAB RESULTS:  CMP     Component Value Date/Time   NA 140 06/17/2015 1112   K 4.5 06/17/2015 1112   CO2 27 06/17/2015 1112   GLUCOSE 96 06/17/2015 1112   BUN 14.0 06/17/2015 1112   CREATININE 0.9 06/17/2015 1112   CALCIUM 9.9 06/17/2015 1112   PROT 7.3 06/17/2015 1112   ALBUMIN 3.8 06/17/2015 1112   AST 15 06/17/2015 1112   ALT 18 06/17/2015 1112   ALKPHOS 65 06/17/2015 1112   BILITOT 0.36 06/17/2015 1112    INo results found for: SPEP, UPEP  Lab Results  Component Value Date   WBC 3.0* 07/01/2015   NEUTROABS 1.2* 07/01/2015   HGB 10.7* 07/01/2015   HCT 31.9* 07/01/2015   MCV 88.4 07/01/2015   PLT 377 07/01/2015      Chemistry      Component Value Date/Time   NA 140 06/17/2015 1112   K 4.5 06/17/2015 1112   CO2 27 06/17/2015 1112   BUN 14.0 06/17/2015 1112   CREATININE 0.9 06/17/2015 1112      Component Value Date/Time   CALCIUM 9.9 06/17/2015 1112   ALKPHOS 65 06/17/2015 1112   AST 15 06/17/2015 1112   ALT 18 06/17/2015 1112   BILITOT 0.36 06/17/2015 1112       No results found for: LABCA2  No components found for: LABCA125  No results for input(s): INR in the last 168 hours.  Urinalysis No results found for: COLORURINE, APPEARANCEUR, LABSPEC, PHURINE, GLUCOSEU, HGBUR, BILIRUBINUR, KETONESUR, PROTEINUR, UROBILINOGEN, NITRITE, LEUKOCYTESUR   ELIGIBLE FOR AVAILABLE RESEARCH PROTOCOL: no  STUDIES: No results  found.  ASSESSMENT: 75 y.o. [facility] woman  status post left breastUpper outer quadrant biopsy 02/27/2015 for a clinical mT1-3 N1 invasive ductal carcinoma, grade 2, strongly estrogen and progesterone receptor positive, HER-2 amplified, with an MIB-1 of 20%.   (a) follow-up ultrasound of the left axilla 04/04/2015 found no abnormal  lymph node to biopsy  (1) bilateral breast MRI 03/25/2015 shows, in addition to a large area of non-masslike enhancement in the left breast, a 0.7 cm mass in the lower outer quadrant of the contralateral, right breast; biopsy of both these areas was performed 04/02/2015, showing  (a) left breast upper outer quadrant: atypical ductal hyperplasia  (b) right breast lower outer quadrant: a clinical T1b N0, stage IA invasive ductal carcinoma, estrogen and progesterone receptor positive, HER-2 not amplified, with an Mib-1 of 4%  (2)  neoadjuvant chemotherapy consisting of weekly paclitaxel 12 together with weekly trastuzumab started 04/29/2015  (a) 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  (b) carboplatin/gemcitabine substituted, starting 06/10/2015 (total of 4 doses [2 D1, D8 cycles] to be given)  (3) trastuzumab will be continued every 3 weeks for an additional 9 months after completion of chemotherapy  (a) echo 04/25/2015 shows an ejection fraction of 55-60%   (4) definitive surgery to follow chemotherapy  (5) adjuvant radiation therapy to follow surgery  (6) tamoxifen to start at the completion of local treatment  (a) the patient has been asked to discontinue Estring as of 05/05/2015, to be resumed when tamoxifen is started   PLAN: Alisson is ready to proceed with treatment today. However her counts are on the lowish side. I think she is not going to have a sufficiently high white cell count 07/07/2015 for her to receive her final chemotherapy cycle.  We discussed giving her Neupogen for the next 3 days but she really does not like that idea and  she just wants to "if I don't get the treatment it's okay with me" next week.  In any case I am changing next weeks Herceptin dose to 6 mg/kg so she can start receiving the Herceptin every 3 weeks.  She is already scheduled for her breast MRI on 07/10/2015. She will see Dr. [name] Stage shortly thereafter to discuss surgery. She will see me on July 11. At that time I will set her up for her remaining Herceptin doses and the next echocardiogram.  She has a good understanding of the overall plan. She agrees with it. She knows to call for any problems that may develop before her next visit here. Isabella Cruel, MD   07/01/2015 11:03 AM

## 2015-07-07 ENCOUNTER — Ambulatory Visit: Payer: Medicare Other | Admitting: Oncology

## 2015-07-07 ENCOUNTER — Encounter: Payer: Self-pay | Admitting: Oncology

## 2015-07-07 ENCOUNTER — Other Ambulatory Visit (HOSPITAL_BASED_OUTPATIENT_CLINIC_OR_DEPARTMENT_OTHER): Payer: Medicare Other

## 2015-07-07 ENCOUNTER — Other Ambulatory Visit: Payer: Self-pay | Admitting: Hematology

## 2015-07-07 ENCOUNTER — Ambulatory Visit (HOSPITAL_BASED_OUTPATIENT_CLINIC_OR_DEPARTMENT_OTHER): Payer: Medicare Other | Admitting: Oncology

## 2015-07-07 ENCOUNTER — Other Ambulatory Visit: Payer: Self-pay | Admitting: *Deleted

## 2015-07-07 ENCOUNTER — Ambulatory Visit (HOSPITAL_BASED_OUTPATIENT_CLINIC_OR_DEPARTMENT_OTHER): Payer: Medicare Other

## 2015-07-07 VITALS — BP 125/65 | HR 90 | Temp 98.1°F | Resp 18 | Wt 171.7 lb

## 2015-07-07 DIAGNOSIS — D709 Neutropenia, unspecified: Secondary | ICD-10-CM

## 2015-07-07 DIAGNOSIS — C50412 Malignant neoplasm of upper-outer quadrant of left female breast: Secondary | ICD-10-CM

## 2015-07-07 DIAGNOSIS — Z5112 Encounter for antineoplastic immunotherapy: Secondary | ICD-10-CM | POA: Diagnosis present

## 2015-07-07 DIAGNOSIS — C50511 Malignant neoplasm of lower-outer quadrant of right female breast: Secondary | ICD-10-CM

## 2015-07-07 LAB — COMPREHENSIVE METABOLIC PANEL
ALBUMIN: 3.8 g/dL (ref 3.5–5.0)
ALK PHOS: 61 U/L (ref 40–150)
ALT: 191 U/L — AB (ref 0–55)
AST: 78 U/L — AB (ref 5–34)
Anion Gap: 8 mEq/L (ref 3–11)
BUN: 10.7 mg/dL (ref 7.0–26.0)
CO2: 28 mEq/L (ref 22–29)
CREATININE: 0.9 mg/dL (ref 0.6–1.1)
Calcium: 10.1 mg/dL (ref 8.4–10.4)
Chloride: 105 mEq/L (ref 98–109)
EGFR: 61 mL/min/{1.73_m2} — ABNORMAL LOW (ref >90)
GLUCOSE: 98 mg/dL (ref 70–140)
POTASSIUM: 4.7 meq/L (ref 3.5–5.1)
SODIUM: 140 meq/L (ref 136–145)
TOTAL PROTEIN: 7 g/dL (ref 6.4–8.3)
Total Bilirubin: <0.3 mg/dL (ref 0.20–1.20)

## 2015-07-07 LAB — CBC WITH DIFFERENTIAL/PLATELET
BASO%: 2.5 % — AB (ref 0.0–2.0)
BASOS ABS: 0 10*3/uL (ref 0.0–0.1)
EOS ABS: 0 10*3/uL (ref 0.0–0.5)
EOS%: 3.4 % (ref 0.0–7.0)
HCT: 32 % — ABNORMAL LOW (ref 34.8–46.6)
HEMOGLOBIN: 10.8 g/dL — AB (ref 11.6–15.9)
LYMPH#: 0.9 10*3/uL (ref 0.9–3.3)
LYMPH%: 73.1 % — ABNORMAL HIGH (ref 14.0–49.7)
MCH: 29.9 pg (ref 25.1–34.0)
MCHC: 33.8 g/dL (ref 31.5–36.0)
MCV: 88.6 fL (ref 79.5–101.0)
MONO#: 0.1 10*3/uL (ref 0.1–0.9)
MONO%: 7.6 % (ref 0.0–14.0)
NEUT%: 13.4 % — ABNORMAL LOW (ref 38.4–76.8)
NEUTROS ABS: 0.2 10*3/uL — AB (ref 1.5–6.5)
NRBC: 0 % (ref 0–0)
Platelets: 480 10*3/uL — ABNORMAL HIGH (ref 145–400)
RBC: 3.61 10*6/uL — ABNORMAL LOW (ref 3.70–5.45)
RDW: 15 % — ABNORMAL HIGH (ref 11.2–14.5)
WBC: 1.2 10*3/uL — ABNORMAL LOW (ref 3.9–10.3)

## 2015-07-07 MED ORDER — DIPHENHYDRAMINE HCL 50 MG/ML IJ SOLN
25.0000 mg | Freq: Once | INTRAMUSCULAR | Status: AC
  Administered 2015-07-07: 25 mg via INTRAVENOUS

## 2015-07-07 MED ORDER — DIPHENHYDRAMINE HCL 50 MG/ML IJ SOLN
INTRAMUSCULAR | Status: AC
  Filled 2015-07-07: qty 1

## 2015-07-07 MED ORDER — HEPARIN SOD (PORK) LOCK FLUSH 100 UNIT/ML IV SOLN
500.0000 [IU] | Freq: Once | INTRAVENOUS | Status: AC | PRN
  Administered 2015-07-07: 500 [IU]
  Filled 2015-07-07: qty 5

## 2015-07-07 MED ORDER — SODIUM CHLORIDE 0.9% FLUSH
10.0000 mL | INTRAVENOUS | Status: DC | PRN
  Administered 2015-07-07: 10 mL
  Filled 2015-07-07: qty 10

## 2015-07-07 MED ORDER — ACETAMINOPHEN 325 MG PO TABS
ORAL_TABLET | ORAL | Status: AC
  Filled 2015-07-07: qty 2

## 2015-07-07 MED ORDER — TRASTUZUMAB CHEMO INJECTION 440 MG
6.0000 mg/kg | Freq: Once | INTRAVENOUS | Status: AC
  Administered 2015-07-07: 483 mg via INTRAVENOUS
  Filled 2015-07-07: qty 23

## 2015-07-07 MED ORDER — ACETAMINOPHEN 325 MG PO TABS
650.0000 mg | ORAL_TABLET | Freq: Once | ORAL | Status: AC
  Administered 2015-07-07: 650 mg via ORAL

## 2015-07-07 MED ORDER — LORAZEPAM 0.5 MG PO TABS: 0.5000 mg | ORAL_TABLET | Freq: Every day | ORAL | Status: DC

## 2015-07-07 MED ORDER — LORAZEPAM 0.5 MG PO TABS: ORAL_TABLET | ORAL | Status: DC

## 2015-07-07 MED ORDER — SODIUM CHLORIDE 0.9 % IV SOLN
Freq: Once | INTRAVENOUS | Status: AC
  Administered 2015-07-07: 13:00:00 via INTRAVENOUS

## 2015-07-07 NOTE — Patient Instructions (Signed)
[facility] Discharge Instructions for Patients Receiving Chemotherapy  Today you received the following chemotherapy agents: Herceptin   To help prevent nausea and vomiting after your treatment, we encourage you to take your nausea medication as directed.    If you develop nausea and vomiting that is not controlled by your nausea medication, call the clinic.   BELOW ARE SYMPTOMS THAT SHOULD BE REPORTED IMMEDIATELY:  *FEVER GREATER THAN 100.5 F  *CHILLS WITH OR WITHOUT FEVER  NAUSEA AND VOMITING THAT IS NOT CONTROLLED WITH YOUR NAUSEA MEDICATION  *UNUSUAL SHORTNESS OF BREATH  *UNUSUAL BRUISING OR BLEEDING  TENDERNESS IN MOUTH AND THROAT WITH OR WITHOUT PRESENCE OF ULCERS  *URINARY PROBLEMS  *BOWEL PROBLEMS  UNUSUAL RASH Items with * indicate a potential emergency and should be followed up as soon as possible.  Feel free to call the clinic you have any questions or concerns. The clinic phone number is [phone].  Please show the CHEMO ALERT CARD at check-in to the Emergency Department and triage nurse.   

## 2015-07-07 NOTE — Progress Notes (Signed)
[place]  Telephone:([phone]) [phone] Fax:(336) [phone]   ID: Isabella Cook DOB: [birth year]  MR#: [id number]  AYT#:[id number]  Patient Care Team: [name], FNP as PCP - General (Family Medicine) [name], MD as Consulting Physician (Oncology) [name], MD as Consulting Physician (General Surgery) [name], NP as Nurse Practitioner (Hematology and Oncology) [name], MD as Consulting Physician (Oncology) Thea Silversmith, MD as Consulting Physician (Radiation Oncology) [name], MD as Consulting Physician (Neurosurgery) [name], MD as Referring Physician (Otolaryngology) [name], MD as Consulting Physician (Cardiology) PCP: [name], FNP OTHER MD:  CHIEF COMPLAINT: HER-2 positive breast cancer, bilateral beast cancers  CURRENT TREATMENT: Paclitaxel, trastuzumab  BREAST CANCER HISTORY:   From the original intake note:  Isabella Cook had routine screening mammography at [facility] suggesting an area of indeterminate microcalcifications in the upper outer quadrant of the left breast measuring 8 mm. She was referred for left breast stereotactic biopsy performed at the [facility] 02/27/2015. This showed ([id number]) invasive ductal carcinoma, grade 2, estrogen receptor 100% positive, progesterone receptor 100% positive, both with strong staining intensity, with an MIB-1 of 20%, and HER-2 amplified with a signals ratio of 2.53, the number per cell being 2.65. 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  On 03/25/2015 the patient underwent bilateral breast MRI, which showed the breast density to be category B. this showed, in the left breast, an area of non-masslike enhancement measuring 7.8 cm. Associated with this were 2 adjacent irregular masses measuring 1.7 and 1.4 cm. The masses were 5 cm anterior and superior to the biopsy clip. There was a cortically thickened left axillary lymph node  noted.  Also in the right breast there was a 7 mm irregular enhancing mass at the 6:30 o'clock position.  Her subsequent history is as detailed below    INTERVAL HISTORY: Isabella Cook returns today for follow-up of her triple positive breast cancer, accompanied by her husband. Today is day 8 cycle 2 of her carboplatin and gemcitabine which she receives on day 1 and day 8 of each 21 day cycle. This is her final cycle of chemotherapy. Today is her last scheduled dose.   She has been receiving trastuzumab on a weekly basis and will switch to every 3 weeks dosing today. She is tolerating that with no side effects that she is aware of   REVIEW OF SYSTEMS: Isabella Cook feels moderately tired, but she still vacuum's the house and takes walks. Her stool habits vary from constipation to diarrhea. She is currently a little bit on the constipated side. Some difficulty sleeping and find that Benadryl or Ativan helps. Uses Ativan when she has nausea. Denies fevers. Aside from these issues a detailed review of systems today was noncontributory  PAST MEDICAL HISTORY: Past Medical History  Diagnosis Date  . GERD (gastroesophageal reflux disease)   . Arthritis     wrists and knees  . Cancer ([place]) 03-2015    left breast  . Multiple food allergies   . Urinary tract infection   . History of hiatal hernia   . History of jaundice as a child     PAST SURGICAL HISTORY: Past Surgical History  Procedure Laterality Date  . Abdominal hysterectomy    . Cholecystectomy    . Appendectomy    . Tubal ligation    . Back surgery      lumbar  . Hernia repair      UHR  . Cervical disc surgery  2016  .  Portacath placement Right 04/22/2015    Procedure: INSERTION PORT-A-CATH WITH [place];  Surgeon: [name], MD;  Location: [facility];  Service: General;  Laterality: Right;    FAMILY HISTORY History reviewed. No pertinent family history. The patient's father had a history of pseudo-bulbar pulse 60 and died from a stroke at age  66. The patient's mother died at age 49 from complications of emphysema. The patient had no brothers, 2 sisters. There is no history of cancer in the family and specifically no history of breast or ovarian cancer.  GYNECOLOGIC HISTORY:  No LMP recorded. Patient has had a hysterectomy. Menarche age 62, first live birth age 53. She is GX P2. She underwent a hysterectomy with left salpingo-oophorectomy in 1984. She received hormone replacement for approximately 15 years, until 2005.  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 Her husband Isabella Cook is a retired Social research officer, government. Daughter Isabella Cook lives in [place] where she is a housewife, homeschooling her children. Son Isabella Cook lives in [place] and works in Chartered certified accountant. The patient has 2 grandchildren. She is not a church attender    ADVANCED DIRECTIVES: In place   HEALTH MAINTENANCE: Social History  Substance Use Topics  . Smoking status: Never Smoker   . Smokeless tobacco: None  . Alcohol Use: Yes     Comment: occ 1 every 2-3 months     Colonoscopy:2014  PAP:  Bone density: 2017 [facility]//osteopenia   Lipid panel:  Allergies  Allergen Reactions  . Dairy Aid [Lactase] Anaphylaxis     Takes claritin every day and benadryl, if needed, to prevent anaphylaxis   . Eggs Or Egg-Derived Products Anaphylaxis    Nasal stuffiness, Takes claritin every day and benadryl, if needed, to prevent anaphylaxis  . Gelatin (Bovine) [Beef Extract] Anaphylaxis    Muscle pain,  Takes claritin every day and benadryl, if needed, to prevent anaphylaxis   . Gluten Meal Anaphylaxis and Diarrhea    Takes claritin every day and benadryl, if needed, to prevent anaphylaxis   . Lambs Quarters Anaphylaxis    ALL "mammal" MEAT per pt -- Muscle pain  Can eat chicken, fish, [place]  . Milk-Related Compounds Anaphylaxis    Nasal stuffiness Cheese (mozzarella,  swiss, cheddar, cottage)  . Pork Allergy Anaphylaxis    ALL "mammal" MEAT per pt -- Muscle pain  Can eat chicken, fish, [place]  . Wheat Bran Anaphylaxis and Diarrhea     Takes claritin every day and benadryl, if needed, to prevent anaphylaxis   . Whey Anaphylaxis and Diarrhea     Takes claritin every day and benadryl, if needed, to prevent anaphylaxis   . Yeast Anaphylaxis     Takes claritin every day and benadryl, if needed, to prevent anaphylaxis  . Soy Allergy Diarrhea  . Almond Oil Nausea And Vomiting    Current Outpatient Prescriptions  Medication Sig Dispense Refill  . calcium carbonate (TUMS - DOSED IN MG ELEMENTAL CALCIUM) 500 MG chewable tablet Chew 500 mg by mouth daily as needed for indigestion or heartburn.     . diphenhydrAMINE (SOMINEX) 25 MG tablet Take 25 mg by mouth at bedtime as needed. Reported on 05/20/2015    . doxycycline (VIBRAMYCIN) 100 MG capsule Take 100 mg by mouth 2 (two) times daily.    . famotidine (PEPCID) 20 MG tablet Take 20 mg by mouth daily.     Marland Kitchen liothyronine (CYTOMEL) 5 MCG tablet Take  5 mcg by mouth daily.    Marland Kitchen loratadine (CLARITIN) 10 MG tablet Take 10 mg by mouth daily.    . Magnesium Citrate 100 MG TABS Take 200 mg by mouth daily.    . Methylcellulose, Laxative, (CITRUCEL) 500 MG TABS Take 2,000 mg by mouth 2 (two) times daily.    . naproxen sodium (ANAPROX) 220 MG tablet Take 440 mg by mouth 3 (three) times daily with meals.    Vladimir Faster Glycol-Propyl Glycol (SYSTANE ULTRA) 0.4-0.3 % SOLN Place 1 drop into both eyes daily as needed (dry eyes).    . Probiotic Product (ALIGN PO) Take 1 tablet by mouth daily.     Marland Kitchen UNABLE TO FIND Place 2 Squirts into the nose 4 (four) times daily. Gentamicin 0.025% Mupirocin 0.2%    . VITAMIN D-VITAMIN K PO Take 1 capsule by mouth daily. Reported on 04/15/2015    . LORazepam (ATIVAN) 0.5 MG tablet Take one tablet by mouth at bedtime as needed for nausea or vomiting. 30 tablet 0   No current facility-administered  medications for this visit.    OBJECTIVE: Middle-aged white woman Who appears stated age 75 Vitals:   07/07/15 1057  BP: 125/65  Pulse: 90  Temp: 98.1 F (36.7 C)  Resp: 18     Body mass index is 30.23 kg/(m^2).    ECOG FS:1 - Symptomatic but completely ambulatory  Sclerae unicteric, EOMs intact Oropharynx clear and moist No cervical or supraclavicular adenopathy Lungs no rales or rhonchi Heart regular rate and rhythm Abd soft, nontender, positive bowel sounds MSK no focal spinal tenderness, no upper extremity lymphedema Neuro: nonfocal, well oriented, appropriate affect Breasts:  Deferred   LAB RESULTS:  CMP     Component Value Date/Time   NA 140 07/07/2015 1045   K 4.7 07/07/2015 1045   CO2 28 07/07/2015 1045   GLUCOSE 98 07/07/2015 1045   BUN 10.7 07/07/2015 1045   CREATININE 0.9 07/07/2015 1045   CALCIUM 10.1 07/07/2015 1045   PROT 7.0 07/07/2015 1045   ALBUMIN 3.8 07/07/2015 1045   AST 78* 07/07/2015 1045   ALT 191* 07/07/2015 1045   ALKPHOS 61 07/07/2015 1045   BILITOT <0.30 07/07/2015 1045    INo results found for: SPEP, UPEP  Lab Results  Component Value Date   WBC 1.2* 07/07/2015   NEUTROABS 0.2* 07/07/2015   HGB 10.8* 07/07/2015   HCT 32.0* 07/07/2015   MCV 88.6 07/07/2015   PLT 480* 07/07/2015      Chemistry      Component Value Date/Time   NA 140 07/07/2015 1045   K 4.7 07/07/2015 1045   CO2 28 07/07/2015 1045   BUN 10.7 07/07/2015 1045   CREATININE 0.9 07/07/2015 1045      Component Value Date/Time   CALCIUM 10.1 07/07/2015 1045   ALKPHOS 61 07/07/2015 1045   AST 78* 07/07/2015 1045   ALT 191* 07/07/2015 1045   BILITOT <0.30 07/07/2015 1045       No results found for: LABCA2  No components found for: HQION629  No results for input(s): INR in the last 168 hours.  Urinalysis No results found for: COLORURINE, APPEARANCEUR, LABSPEC, PHURINE, GLUCOSEU, HGBUR, BILIRUBINUR, KETONESUR, PROTEINUR, UROBILINOGEN, NITRITE,  LEUKOCYTESUR   ELIGIBLE FOR AVAILABLE RESEARCH PROTOCOL: no  STUDIES: No results found.  ASSESSMENT: 75 [facility].o. [facility] woman  status post left breastUpper outer quadrant biopsy 02/27/2015 for a clinical mT1-3 N1 invasive ductal carcinoma, grade 2, strongly estrogen and progesterone receptor positive, HER-2 amplified, with  an MIB-1 of 20%.   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  (b) right breast lower outer quadrant: a clinical T1b N0, stage IA invasive ductal carcinoma, estrogen and progesterone receptor positive, HER-2 not amplified, with an Mib-1 of 4%  (2)  neoadjuvant chemotherapy consisting of weekly paclitaxel 12 together with weekly trastuzumab started 04/29/2015  (a) 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 Will hold her chemotherapy, but proceed with trastuzumab. Discussed neutropenic precautions with her. She is to call for a fever greater than or equal to 100.5.  Herceptin dosing has been changed to 6  mg/kg so she can start receiving the Herceptin every 3 weeks.  She is already scheduled for her breast MRI on 07/10/2015. She will see Dr. [name] Stage shortly thereafter to discuss surgery. She will see Dr. [name] on July 11. At that time, she will be set her up for her remaining Herceptin doses and the next echocardiogram.  Ativan was refilled today.  She has a good understanding of the overall plan. She agrees with it. She knows to call for any problems that may develop before her next visit here.  Mikey Bussing, NP   07/07/2015 11:30 AM

## 2015-07-10 ENCOUNTER — Other Ambulatory Visit: Payer: Self-pay | Admitting: *Deleted

## 2015-07-10 ENCOUNTER — Other Ambulatory Visit: Payer: Self-pay | Admitting: Oncology

## 2015-07-10 ENCOUNTER — Telehealth: Payer: Self-pay | Admitting: *Deleted

## 2015-07-10 ENCOUNTER — Inpatient Hospital Stay: Admission: RE | Admit: 2015-07-10 | Payer: Medicare Other | Source: Ambulatory Visit

## 2015-07-10 ENCOUNTER — Ambulatory Visit
Admission: RE | Admit: 2015-07-10 | Discharge: 2015-07-10 | Disposition: A | Discharge status: Home / Self Care | Payer: Medicare Other | Source: Ambulatory Visit | Attending: Oncology | Admitting: Oncology

## 2015-07-10 DIAGNOSIS — C50412 Malignant neoplasm of upper-outer quadrant of left female breast: Secondary | ICD-10-CM

## 2015-07-10 DIAGNOSIS — C50511 Malignant neoplasm of lower-outer quadrant of right female breast: Secondary | ICD-10-CM

## 2015-07-10 MED ORDER — GADOBENATE DIMEGLUMINE 529 MG/ML IV SOLN
14.0000 mL | Freq: Once | INTRAVENOUS | Status: AC | PRN
  Administered 2015-07-10: 14 mL via INTRAVENOUS

## 2015-07-10 NOTE — Telephone Encounter (Signed)
Call report for today's MRI per [facility] with [facility].  "No abnormal enhancements at site of biopsy.  No significant changes.  There is a new 7 mm nodule to left lower lobe."  Next scheduled F/U 07-15-2015.

## 2015-07-10 NOTE — Telephone Encounter (Signed)
New order placed by this RN for MRI of breast - urgent request for MD to sign entered order.

## 2015-07-10 NOTE — Telephone Encounter (Signed)
Patient needs new order for MRI breast which was previously ordered by Gentry Fitz NP

## 2015-07-14 ENCOUNTER — Ambulatory Visit: Payer: Self-pay | Admitting: Surgery

## 2015-07-14 ENCOUNTER — Encounter: Payer: Self-pay | Admitting: *Deleted

## 2015-07-14 DIAGNOSIS — C50212 Malignant neoplasm of upper-inner quadrant of left female breast: Principal | ICD-10-CM

## 2015-07-14 DIAGNOSIS — C50211 Malignant neoplasm of upper-inner quadrant of right female breast: Secondary | ICD-10-CM

## 2015-07-14 NOTE — H&P (Signed)
Isabella Cook 07/14/2015 2:04 PM Location: [facility] Patient #: [id number] DOB: 20-Sep-1940 Married / Language: [name] / Race: White Female  History of Present Illness [name] A. Leta Bucklin MD; 07/14/2015 3:42 PM) Patient words: Patient returns for follow-up of bilateral multifocal breast cancers. She was seen in the spring of this year and was placed on neoadjuvant chemotherapy for bilateral breast cancers. She has significant enhancement of her left breast in 2 areas biopsied her left breast both the upper outer quadrant 1 consistent with invasive duct carcinoma and the second with atypical ductal hyperplasia. She is an area of several centimeters of enhancement that has remained unchanged pre-and postchemotherapy MRI. She had a 7 mm right breast cancer as well and this seems to over aggressive chemotherapy. She returns today to discuss options of mastectomy versus multiple lumpectomies. The patient is a strong desire to preserve her breast. She wishes to undergo multiple lumpectomies and bilateral sentinel lymph node mapping. She understands she will have a cosmetic difference after lumpectomy specimen the left with radiation therapy. I discussed the alternative of bilateral mastectomies with possible nipple preservation reconstruction if desired. She would rather consider breast conservation surgery since his last surgery and survival outcomes of the same with mastectomy versus lumpectomy.  The patient is a 75 year old female.   Allergies [name], [place]; 07/14/2015 2:05 PM) Gelatin *Assorted Classes** any medication that is gelatin based BEEF  Medication History [name], CMA; 07/14/2015 2:05 PM) Herceptin (440MG  For Solution, Intravenous) Active. Tums 500 (1250MG  Tablet Chewable, Oral) Active. Pepcid (20MG  Tablet, Oral) Active. Claritin (10MG  Tablet, Oral) Active. Methylcellulose (Laxative) (475MG  Tablet, Oral) Active. Probiotic Acidophilus (Oral)  Active. Vitamin D (Cholecalciferol) (1000UNIT Capsule, Oral) Active. Magnesium Citrate (100MG  Tablet, Oral) Active. Estring (2MG  Ring, Vaginal) Active. Medications Reconciled    Vitals [name] CMA; 07/14/2015 2:06 PM) 07/14/2015 2:05 PM Weight: 173 lb Height: 63.5in Body Surface Area: 1.83 m Body Mass Index: 30.16 kg/m  Temp.: 98.69F  Pulse: 110 (Regular)  BP: 138/84 (Sitting, Left Arm, Standard)      Physical Exam (Chasen Mendell A. Kolleen Ochsner MD; 07/14/2015 3:42 PM)  Chest and Lung Exam Chest and lung exam reveals -quiet, even and easy respiratory effort with no use of accessory muscles and on auscultation, normal breath sounds, no adventitious sounds and normal vocal resonance. Inspection Chest Wall - Normal. Back - normal.    Assessment & Plan (Khush Pasion A. Doris Mcgilvery MD; 07/14/2015 3:44 PM)  BILATERAL BREAST CANCER (C50.911) Impression: Discuss options of breast conservation versus mastectomy. Discussed reconstruction options and she is to see plastic surgery in the next 1-2 weeks. She does have a strong desire to preserve her breast and has considered multiple lumpectomies. She would need to left lower rectum easily and one on the right. She would require bilateral sentinel lymph node mapping. She has no obvious disease on clinical examination today and normal lymph node basins. She does have significant density on the left this could represent DCIS versus fibrocystic disease. I told her that if lumpectomy fails she would require mastectomy. She understands that risk would like an attempt to preserve her breast. Risk of lumpectomy include bleeding, infection, seroma, more surgery, use of seed/wire, wound care, cosmetic deformity and the need for other treatments, death , blood clots, death. Pt agrees to proceed. Risk of sentinel lymph node mapping include bleeding, infection, lymphedema, shoulder pain. stiffness, dye allergy. arm swelling cosmetic deformity , blood clots,  death, need for more surgery. Pt agres to proceed.  Current Plans  You are being scheduled for surgery - Our schedulers will call you.  You should hear from our office's scheduling department within 5 working days about the location, date, and time of surgery. We try to make accommodations for patient's preferences in scheduling surgery, but sometimes the OR schedule or the surgeon's schedule prevents [place] from making those accommodations.  If you have not heard from our office [phone]) in 5 working days, call the office and ask for your surgeon's nurse.  If you have other questions about your diagnosis, plan, or surgery, call the office and ask for your surgeon's nurse.  We discussed the staging and pathophysiology of breast cancer. We discussed all of the different options for treatment for breast cancer including surgery, chemotherapy, radiation therapy, Herceptin, and antiestrogen therapy. We discussed a sentinel lymph node biopsy as she does not appear to having lymph node involvement right now. We discussed the performance of that with injection of radioactive tracer and blue dye. We discussed that she would have an incision underneath her axillary hairline. We discussed that there is a bout a 10-20% chance of having a positive node with a sentinel lymph node biopsy and we will await the permanent pathology to make any other first further decisions in terms of her treatment. One of these options might be to return to the operating room to perform an axillary lymph node dissection. We discussed about a 1-2% risk lifetime of chronic shoulder pain as well as lymphedema associated with a sentinel lymph node biopsy. We discussed the options for treatment of the breast cancer which included lumpectomy versus a mastectomy. We discussed the performance of the lumpectomy with a wire placement. We discussed a 10-20% chance of a positive margin requiring reexcision in the operating room. We also discussed  that she may need radiation therapy or antiestrogen therapy or both if she undergoes lumpectomy. We discussed the mastectomy and the postoperative care for that as well. We discussed that there is no difference in her survival whether she undergoes lumpectomy with radiation therapy or antiestrogen therapy versus a mastectomy. There is a slight difference in the local recurrence rate being 3-5% with lumpectomy and about 1% with a mastectomy. We discussed the risks of operation including bleeding, infection, possible reoperation. She understands her further therapy will be based on what her stages at the time of her operation.  Pt Education - flb breast cancer surgery: discussed with patient and provided information. Pt Education - [facility] Breast Biopsy HCI: discussed with patient and provided information. Pt Education - ABC (After Breast Cancer) Class Info: discussed with patient and provided information.

## 2015-07-15 ENCOUNTER — Ambulatory Visit (HOSPITAL_BASED_OUTPATIENT_CLINIC_OR_DEPARTMENT_OTHER): Payer: Medicare Other | Admitting: Oncology

## 2015-07-15 ENCOUNTER — Other Ambulatory Visit (HOSPITAL_BASED_OUTPATIENT_CLINIC_OR_DEPARTMENT_OTHER): Payer: Medicare Other

## 2015-07-15 ENCOUNTER — Telehealth: Payer: Self-pay | Admitting: Oncology

## 2015-07-15 VITALS — BP 151/77 | HR 77 | Temp 98.2°F | Resp 18 | Ht 63.2 in | Wt 174.5 lb

## 2015-07-15 DIAGNOSIS — C50511 Malignant neoplasm of lower-outer quadrant of right female breast: Secondary | ICD-10-CM | POA: Diagnosis not present

## 2015-07-15 DIAGNOSIS — C50412 Malignant neoplasm of upper-outer quadrant of left female breast: Secondary | ICD-10-CM

## 2015-07-15 DIAGNOSIS — M8580 Other specified disorders of bone density and structure, unspecified site: Secondary | ICD-10-CM

## 2015-07-15 LAB — CBC WITH DIFFERENTIAL/PLATELET
BASO%: 1.1 % (ref 0.0–2.0)
Basophils Absolute: 0.1 10*3/uL (ref 0.0–0.1)
EOS ABS: 0.1 10*3/uL (ref 0.0–0.5)
EOS%: 1.3 % (ref 0.0–7.0)
HEMATOCRIT: 34.2 % — AB (ref 34.8–46.6)
HGB: 11.4 g/dL — ABNORMAL LOW (ref 11.6–15.9)
LYMPH%: 17.2 % (ref 14.0–49.7)
MCH: 30.2 pg (ref 25.1–34.0)
MCHC: 33.2 g/dL (ref 31.5–36.0)
MCV: 91 fL (ref 79.5–101.0)
MONO#: 1.1 10*3/uL — AB (ref 0.1–0.9)
MONO%: 16.6 % — ABNORMAL HIGH (ref 0.0–14.0)
NEUT%: 63.8 % (ref 38.4–76.8)
NEUTROS ABS: 4.4 10*3/uL (ref 1.5–6.5)
PLATELETS: 182 10*3/uL (ref 145–400)
RBC: 3.76 10*6/uL (ref 3.70–5.45)
RDW: 17.4 % — ABNORMAL HIGH (ref 11.2–14.5)
WBC: 6.8 10*3/uL (ref 3.9–10.3)
lymph#: 1.2 10*3/uL (ref 0.9–3.3)

## 2015-07-15 NOTE — Telephone Encounter (Signed)
appt made and avs printed °

## 2015-07-15 NOTE — Progress Notes (Signed)
Isabella Cook  [phone] [phone]   ID: Isabella Cook DOB: Oct 29, 1940  MR#: [id number]  JOA#:[id number]  Patient Care Team: Isabella Singh, FNP as PCP - General (Family Medicine) Isabella Cruel, MD as Consulting Physician (Oncology) Isabella Luna, MD as Consulting Physician (General Surgery) Isabella Cheese, NP as Nurse Practitioner (Hematology and Oncology) Isabella Cruel, MD as Consulting Physician (Oncology) Isabella Silversmith, MD as Consulting Physician (Radiation Oncology) Isabella Levine, MD as Consulting Physician (Neurosurgery) Isabella Booty, MD as Referring Physician (Otolaryngology) Isabella Artist, MD as Consulting Physician (Cardiology) PCP: Isabella Singh, FNP OTHER MD:  CHIEF COMPLAINT: HER-2 positive breast cancer, bilateral beast cancers  CURRENT TREATMENT: Paclitaxel, trastuzumab  BREAST CANCER HISTORY:   From the original intake note:  Isabella Cook had routine screening mammography at [facility] suggesting an area of indeterminate microcalcifications in the upper outer quadrant of the left breast measuring 8 mm. She was referred for left breast stereotactic biopsy performed at the [facility] 02/27/2015. This showed ([id number]) invasive ductal carcinoma, grade 2, estrogen receptor 100% positive, progesterone receptor 100% positive, both with strong staining intensity, with an MIB-1 of 20%, and HER-2 amplified with a signals ratio of 2.53, the number per cell being 2.65. 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  On 03/25/2015 the patient underwent bilateral breast MRI, which showed the breast density to be category B. this showed, in the left breast, an area of non-masslike enhancement measuring 7.8 cm. Associated with this were 2 adjacent irregular masses measuring 1.7 and 1.4 cm. The masses were 5 cm anterior and superior to the biopsy clip. There was a cortically thickened left axillary lymph node  noted.  Also in the right breast there was a 7 mm irregular enhancing mass at the 6:30 o'clock position.  Her subsequent history is as detailed below    INTERVAL HISTORY: Isabella Cook returns today for follow-up of her breast cancer, accompanied by her husband. Today is day 8 cycle 2 of her carboplatin and gemcitabine which she receives on day 1 and day 8 of each 21 day cycle. We held the treatment last week because of low counts, but they have now resolved. This is her final cycle of chemotherapy. Today is her last scheduled dose.   She did receive trastuzumab and week ago, and surprisingly she had nausea with that. She thinks it may be due to 1 of her other medications, and she is making some changes accordingly.  REVIEW OF SYSTEMS: Isabella Cook has a little bit of a runny nose, a little dry cough, but otherwise a detailed review of systems today was benign.  PAST MEDICAL HISTORY: Past Medical History  Diagnosis Date  . GERD (gastroesophageal reflux disease)   . Arthritis     wrists and knees  . Cancer ([place]) 03-2015    left breast  . Multiple food allergies   . Urinary tract infection   . History of hiatal hernia   . History of jaundice as a child     PAST SURGICAL HISTORY: Past Surgical History  Procedure Laterality Date  . Abdominal hysterectomy    . Cholecystectomy    . Appendectomy    . Tubal ligation    . Back surgery      lumbar  . Hernia repair      UHR  . Cervical disc surgery  2016  . Portacath placement Right 04/22/2015    Procedure: INSERTION PORT-A-CATH WITH [place];  Surgeon: Isabella Luna, MD;  Location: [facility];  Service: General;  Laterality: Right;    FAMILY HISTORY No family history on file. The patient's father had a history of pseudo-bulbar pulse 59 and died from a stroke at age 91. The patient's mother died at age 51 from complications of emphysema. The patient had no brothers, 2 sisters. There is no history of cancer in the family and specifically no history of breast  or ovarian cancer.  GYNECOLOGIC HISTORY:  No LMP recorded. Patient has had a hysterectomy. Menarche age 27, first live birth age 5. She is GX P2. She underwent a hysterectomy with left salpingo-oophorectomy in 1984. She received hormone replacement for approximately 15 years, until 2005.  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 Her husband Isabella Cook is a retired Social research officer, government. Daughter Isabella Cook lives in [place] where she is a housewife, homeschooling her children. Son Isabella Cook lives in [place] and works in Chartered certified accountant. The patient has 2 grandchildren. She is not a church attender    ADVANCED DIRECTIVES: In place   HEALTH MAINTENANCE: Social History  Substance Use Topics  . Smoking status: Never Smoker   . Smokeless tobacco: Not on file  . Alcohol Use: Yes     Comment: occ 1 every 2-3 months     Colonoscopy:2014  PAP:  Bone density: 2017 [facility]//osteopenia   Lipid panel:  Allergies  Allergen Reactions  . Dairy Aid [Lactase] Anaphylaxis     Takes claritin every day and benadryl, if needed, to prevent anaphylaxis   . Eggs Or Egg-Derived Products Anaphylaxis    Nasal stuffiness, Takes claritin every day and benadryl, if needed, to prevent anaphylaxis  . Gelatin (Bovine) [Beef Extract] Anaphylaxis    Muscle pain,  Takes claritin every day and benadryl, if needed, to prevent anaphylaxis   . Gluten Meal Anaphylaxis and Diarrhea    Takes claritin every day and benadryl, if needed, to prevent anaphylaxis   . Lambs Quarters Anaphylaxis    ALL "mammal" MEAT per pt -- Muscle pain  Can eat chicken, fish, [place]  . Milk-Related Compounds Anaphylaxis    Nasal stuffiness Cook (mozzarella, swiss, cheddar, cottage)  . Pork Allergy Anaphylaxis    ALL "mammal" MEAT per pt -- Muscle pain  Can eat chicken, fish, [place]  . Wheat Bran Anaphylaxis and Diarrhea     Takes claritin  every day and benadryl, if needed, to prevent anaphylaxis   . Whey Anaphylaxis and Diarrhea     Takes claritin every day and benadryl, if needed, to prevent anaphylaxis   . Yeast Anaphylaxis     Takes claritin every day and benadryl, if needed, to prevent anaphylaxis  . Soy Allergy Diarrhea  . Almond Oil Nausea And Vomiting    Current Outpatient Prescriptions  Medication Sig Dispense Refill  . calcium carbonate (TUMS - DOSED IN MG ELEMENTAL CALCIUM) 500 MG chewable tablet Chew 500 mg by mouth daily as needed for indigestion or heartburn.     . diphenhydrAMINE (SOMINEX) 25 MG tablet Take 25 mg by mouth at bedtime as needed. Reported on 05/20/2015    . doxycycline (VIBRAMYCIN) 100 MG capsule Take 100 mg by mouth 2 (two) times daily.    . famotidine (PEPCID) 20 MG tablet Take 20 mg by mouth daily.     Marland Kitchen liothyronine (CYTOMEL) 5 MCG tablet Take 5 mcg by mouth daily.    Marland Kitchen loratadine (CLARITIN) 10 MG tablet Take 10 mg by mouth daily.    Marland Kitchen  LORazepam (ATIVAN) 0.5 MG tablet Take one tablet by mouth at bedtime as needed for nausea or vomiting. 30 tablet 0  . Magnesium Citrate 100 MG TABS Take 200 mg by mouth daily.    . Methylcellulose, Laxative, (CITRUCEL) 500 MG TABS Take 2,000 mg by mouth 2 (two) times daily.    . naproxen sodium (ANAPROX) 220 MG tablet Take 440 mg by mouth 3 (three) times daily with meals.    Vladimir Faster Glycol-Propyl Glycol (SYSTANE ULTRA) 0.4-0.3 % SOLN Place 1 drop into both eyes daily as needed (dry eyes).    . Probiotic Product (ALIGN PO) Take 1 tablet by mouth daily.     Marland Kitchen UNABLE TO FIND Place 2 Squirts into the nose 4 (four) times daily. Gentamicin 0.025% Mupirocin 0.2%    . VITAMIN D-VITAMIN K PO Take 1 capsule by mouth daily. Reported on 04/15/2015     No current facility-administered medications for this visit.    OBJECTIVE: Middle-aged white woman In no acute distress Filed Vitals:   07/15/15 0957  BP: 151/77  Pulse: 77  Temp: 98.2 F (36.8 C)  Resp: 18      Body mass index is 30.73 kg/(m^2).    ECOG FS:1 - Symptomatic but completely ambulatory  Sclerae unicteric, pupils round and equal Oropharynx clear and moist-- no thrush or other lesions No cervical or supraclavicular adenopathy Lungs no rales or rhonchi Heart regular rate and rhythm Abd soft, nontender, positive bowel sounds MSK no focal spinal tenderness, no upper extremity lymphedema Neuro: nonfocal, well oriented, appropriate affect Breasts: Deferred    LAB RESULTS:  CMP     Component Value Date/Time   NA 140 07/07/2015 1045   K 4.7 07/07/2015 1045   CO2 28 07/07/2015 1045   GLUCOSE 98 07/07/2015 1045   BUN 10.7 07/07/2015 1045   CREATININE 0.9 07/07/2015 1045   CALCIUM 10.1 07/07/2015 1045   PROT 7.0 07/07/2015 1045   ALBUMIN 3.8 07/07/2015 1045   AST 78* 07/07/2015 1045   ALT 191* 07/07/2015 1045   ALKPHOS 61 07/07/2015 1045   BILITOT <0.30 07/07/2015 1045    INo results found for: SPEP, UPEP  Lab Results  Component Value Date   WBC 6.8 07/15/2015   NEUTROABS 4.4 07/15/2015   HGB 11.4* 07/15/2015   HCT 34.2* 07/15/2015   MCV 91.0 07/15/2015   PLT 182 07/15/2015      Chemistry      Component Value Date/Time   NA 140 07/07/2015 1045   K 4.7 07/07/2015 1045   CO2 28 07/07/2015 1045   BUN 10.7 07/07/2015 1045   CREATININE 0.9 07/07/2015 1045      Component Value Date/Time   CALCIUM 10.1 07/07/2015 1045   ALKPHOS 61 07/07/2015 1045   AST 78* 07/07/2015 1045   ALT 191* 07/07/2015 1045   BILITOT <0.30 07/07/2015 1045       No results found for: LABCA2  No components found for: [id number]  No results for input(s): INR in the last 168 hours.  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 MRI guided biopsy of a irregular  right breast mass demonstrated invasive mammary carcinoma and mammary carcinoma in situ. The patient is seen for evaluation following neoadjuvant therapy. LABS:  No labs were performed at the imaging center today. EXAM: BILATERAL BREAST MRI WITH AND WITHOUT CONTRAST TECHNIQUE: Multiplanar, multisequence MR images of both breasts were obtained prior to and following the intravenous administration of 14 ml of MultiHance. THREE-DIMENSIONAL MR IMAGE RENDERING ON INDEPENDENT WORKSTATION: Three-dimensional MR images were rendered by post-processing of the original MR data on an independent workstation. The three-dimensional MR images were interpreted, and findings are reported in the following complete MRI report for this study. Three dimensional images were evaluated at the independent DynaCad workstation COMPARISON:  Previous exam(s) including breast MRI dated 03/25/2015. FINDINGS: Breast composition: c. Heterogeneous fibroglandular tissue. Background parenchymal enhancement: Minimal Right breast: No suspicious mass or enhancement is seen within the right breast. Specifically, no abnormal enhancement is noted at the site of the biopsy proven right breast carcinoma. Artifact from the right breast biopsy marker is seen on series 3, image 116. Left breast: Biopsy marker artifact is again seen within the lower, outer left breast (series 3, image 80). New biopsy marker artifact from the MRI guided left breast biopsy is seen on series 3, image 56. This artifact is just superior and lateral to the targeted irregular enhancing mass which is unchanged in appearance from prior exam measuring 1.4 x 1.1 x 1.2 cm. The adjacent previously noted irregular enhancing mass  is also unchanged in size measuring 1.7 x 1.0 x 1.1 cm. The previously noted extensive non mass enhancement involving the upper, outer and lower, outer left breast is not significantly changed in size or appearance from prior exam, again measuring 7.8 x 3.6 x 3.3 cm. Lymph nodes: The previously noted prominent left axillary lymph node is unchanged from prior MRI. Second-look targeted ultrasound of the left axilla demonstrated no suspicious appearing axillary lymph nodes. Ancillary findings: A possible 7 mm nodule is noted within the peripheral left lower lobe (series 5, image 92). IMPRESSION: 1. No abnormal enhancement is identified at the site of the MRI biopsy proven right breast carcinoma. 2. No significant interval change in the previously noted large area of extensive non mass enhancement within the upper and lower outer left breast. The previously noted irregular enhancing masses along the superior aspect of this non mass enhancement are unchanged in size. 3. Possible 7 mm nodule in the peripheral left lower lobe. RECOMMENDATION: 1. Treatment plan. 2. Consider further evaluation with chest CT for the possible left lower lobe pulmonary nodule. These results will be called to the ordering clinician or representative by the Radiologist Assistant, and communication documented in the PACS or zVision Dashboard. BI-RADS CATEGORY  6: Known biopsy-proven malignancy. Electronically Signed   By: Pamelia Hoit M.D.   On: 07/10/2015 15:06    ASSESSMENT: 75 y.o. [facility] woman  status post left breast upper outer quadrant biopsy 02/27/2015 for a clinical mT1-3 N1 invasive ductal carcinoma, grade 2, strongly estrogen and progesterone receptor positive, HER-2 amplified, with an MIB-1 of 20%.   (a) follow-up ultrasound of the left axilla 04/04/2015 found no abnormal lymph node to biopsy  (1) bilateral breast MRI 03/25/2015 shows, in addition to a large area of non-masslike enhancement in the left breast, a 0.7 cm mass in  the lower outer quadrant of the contralateral, right breast; biopsy of both these areas was performed 04/02/2015, showing  (a) left breast upper outer quadrant: atypical ductal hyperplasia  (b) right breast lower outer quadrant:  a clinical T1b N0, stage IA invasive ductal carcinoma, estrogen and progesterone receptor positive, HER-2 not amplified, with an Mib-1 of 4%  (2)  neoadjuvant chemotherapy consisting of weekly paclitaxel 12 together with weekly trastuzumab started 04/29/2015  (a) 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  (b) carboplatin/gemcitabine substituted, starting 06/10/2015 (total of 4 doses [2 D1, D8 cycles] to be given)  (3) trastuzumab will be continued every 3 weeks for an additional 9 months after completion of chemotherapy  (a) echo 04/25/2015 shows an ejection fraction of 55-60%   (4) definitive surgery to follow chemotherapy  (5) adjuvant radiation therapy to follow surgery  (6) tamoxifen to start at the completion of local treatment  (a) the patient has been asked to discontinue Estring as of 05/05/2015, to be resumed when tamoxifen is started   (7) incidentally noted left lower lobe 0.7 cm nodule  on 07/10/2015 MRI to be followed   PLAN: Gazelle is completing her chemotherapy today. Of course we are continuing the anti-HER-2 antibodies, which will be trastuzumab alone, and her next dose will be 07/28/2015. She is scheduled for an echocardiogram same day and we may want to change that date.  She has a ready met with Dr. [name] Stage who is planning a "triple lumpectomy". They do not have a surgical date yet, but it will be in August. They are still waiting on insurance clearance.  We discussed her MRI results, which show an incidental 0.7 cm left lower lobe nodule not commented on MRI from March 2017. This requires only follow-up at this point.  I'm going to check lab work next week and if for the [facility] is as low as last time we will prophylax with  antibiotics. Otherwise she will see me again with her next Herceptin dose.  She knows to call for any problems that may develop before her next visit here.    Isabella Cruel, MD   07/15/2015 10:21 AM

## 2015-07-17 ENCOUNTER — Encounter: Payer: Self-pay | Admitting: *Deleted

## 2015-07-22 ENCOUNTER — Other Ambulatory Visit (HOSPITAL_BASED_OUTPATIENT_CLINIC_OR_DEPARTMENT_OTHER): Payer: Medicare Other

## 2015-07-22 DIAGNOSIS — C50511 Malignant neoplasm of lower-outer quadrant of right female breast: Secondary | ICD-10-CM

## 2015-07-22 DIAGNOSIS — C50412 Malignant neoplasm of upper-outer quadrant of left female breast: Secondary | ICD-10-CM

## 2015-07-22 LAB — CBC WITH DIFFERENTIAL/PLATELET
BASO%: 3 % — AB (ref 0.0–2.0)
Basophils Absolute: 0.1 10*3/uL (ref 0.0–0.1)
EOS%: 7.6 % — ABNORMAL HIGH (ref 0.0–7.0)
Eosinophils Absolute: 0.4 10*3/uL (ref 0.0–0.5)
HCT: 35.6 % (ref 34.8–46.6)
HGB: 11.8 g/dL (ref 11.6–15.9)
LYMPH%: 23.9 % (ref 14.0–49.7)
MCH: 30.6 pg (ref 25.1–34.0)
MCHC: 33.1 g/dL (ref 31.5–36.0)
MCV: 92.2 fL (ref 79.5–101.0)
MONO#: 0.8 10*3/uL (ref 0.1–0.9)
MONO%: 18.3 % — AB (ref 0.0–14.0)
NEUT%: 47.2 % (ref 38.4–76.8)
NEUTROS ABS: 2.2 10*3/uL (ref 1.5–6.5)
Platelets: 171 10*3/uL (ref 145–400)
RBC: 3.86 10*6/uL (ref 3.70–5.45)
RDW: 18.3 % — ABNORMAL HIGH (ref 11.2–14.5)
WBC: 4.6 10*3/uL (ref 3.9–10.3)
lymph#: 1.1 10*3/uL (ref 0.9–3.3)

## 2015-07-23 ENCOUNTER — Other Ambulatory Visit: Payer: Self-pay | Admitting: Surgery

## 2015-07-23 DIAGNOSIS — C50212 Malignant neoplasm of upper-inner quadrant of left female breast: Principal | ICD-10-CM

## 2015-07-23 DIAGNOSIS — C50211 Malignant neoplasm of upper-inner quadrant of right female breast: Secondary | ICD-10-CM

## 2015-07-28 ENCOUNTER — Ambulatory Visit (HOSPITAL_BASED_OUTPATIENT_CLINIC_OR_DEPARTMENT_OTHER)
Admission: RE | Admit: 2015-07-28 | Discharge: 2015-07-28 | Disposition: A | Discharge status: Home / Self Care | Payer: Medicare Other | Source: Ambulatory Visit | Attending: Internal Medicine | Admitting: Internal Medicine

## 2015-07-28 ENCOUNTER — Ambulatory Visit (HOSPITAL_COMMUNITY)
Admission: RE | Admit: 2015-07-28 | Discharge: 2015-07-28 | Disposition: A | Discharge status: Home / Self Care | Payer: Medicare Other | Source: Ambulatory Visit | Attending: Internal Medicine | Admitting: Internal Medicine

## 2015-07-28 ENCOUNTER — Encounter (HOSPITAL_COMMUNITY): Payer: Self-pay | Admitting: Internal Medicine

## 2015-07-28 VITALS — BP 144/86 | HR 79 | Wt 173.5 lb

## 2015-07-28 DIAGNOSIS — C50911 Malignant neoplasm of unspecified site of right female breast: Secondary | ICD-10-CM | POA: Diagnosis not present

## 2015-07-28 DIAGNOSIS — K219 Gastro-esophageal reflux disease without esophagitis: Secondary | ICD-10-CM | POA: Diagnosis not present

## 2015-07-28 DIAGNOSIS — C50912 Malignant neoplasm of unspecified site of left female breast: Secondary | ICD-10-CM | POA: Diagnosis not present

## 2015-07-28 DIAGNOSIS — Z79899 Other long term (current) drug therapy: Secondary | ICD-10-CM | POA: Insufficient documentation

## 2015-07-28 DIAGNOSIS — C50412 Malignant neoplasm of upper-outer quadrant of left female breast: Secondary | ICD-10-CM

## 2015-07-28 DIAGNOSIS — Z170 Estrogen receptor positive status [ER+]: Secondary | ICD-10-CM | POA: Diagnosis not present

## 2015-07-28 DIAGNOSIS — C50511 Malignant neoplasm of lower-outer quadrant of right female breast: Secondary | ICD-10-CM | POA: Diagnosis not present

## 2015-07-28 DIAGNOSIS — Z9221 Personal history of antineoplastic chemotherapy: Secondary | ICD-10-CM | POA: Diagnosis not present

## 2015-07-28 DIAGNOSIS — Z0189 Encounter for other specified special examinations: Secondary | ICD-10-CM | POA: Diagnosis not present

## 2015-07-28 NOTE — Progress Notes (Signed)
Patient ID: Isabella Cook, female   DOB: 1940-04-29, 75 y.o.   MRN: [id number]    Advanced Heart Failure/ Cardio-Oncology Clinic Note   Oncology: Dr. [name]  75 yo with bilateral breast cancer, diagnosed 2/17.  ER+/PR+/HER2+.    Isabella Cook had routine screening mammography at [facility] suggesting an area of indeterminate microcalcifications in the upper outer quadrant of the left breast measuring 8 mm. She was referred for left breast stereotactic biopsy performed at the [facility] 02/27/2015. This showed ([id number] [id number]) invasive ductal carcinoma, grade 2, estrogen receptor 100% positive, progesterone receptor 100% positive, both with strong staining intensity, with an MIB-1 of 20%, and HER-2 amplified with a signals ratio of 2.53, the number per cell being 2.65. 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  On 03/25/2015 the patient underwent bilateral breast MRI, which showed the breast density to be category B. this showed, in the left breast, an area of non-masslike enhancement measuring 7.8 cm. Associated with this were 2 adjacent irregular masses measuring 1.7 and 1.4 cm. The masses were 5 cm anterior and superior to the biopsy clip. There was a cortically thickened left axillary lymph node noted.  Underwent neoadjuvant chemotherapy consisting of weekly paclitaxel 12 together with weekly trastuzumab started 04/29/2015. 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 Carboplatin/gemcitabine substituted, starting 06/10/2015 (total of 4 doses [2 D1, D8 cycles]   No prior cardiac history.     She presents today for follow up with Echo. Now finished with chemo, and getting Herceptin alone.  No exertional dyspnea or chest pain.  Overall feeling ok.  Scheduled for lumpectomy 08/07/15.  Echo 04/25/15 LVEF 55-60%, Grade 1 DD, Lat s' 11.5, GLS -19.1% Echo 07/28/15 LVEF 55-60%, Grade 1 DD, Lat s' 11.3, GLS -14.3% (Underestimated due to poor tracking)  PMH: 1. GERD 2. Breast  cancer: Bilateral.  Diagnosed 2/17.  ER+/PR+/HER2+.  Plan for weekly paclitaxel + Herceptin x 12 wks then Herceptin every 3 weeks x 9 more months.  Surgery after chemo.  - Echo (4/17) with EF 55-60%, mild LVH, normal RV size and systolic function, lateral s' 11.5 cm/sec, GLS -19.1%.   Social History   Social History  . Marital status: Married    Spouse name: N/A  . Number of children: N/A  . Years of education: N/A   Occupational History  . Not on file.   Social History Main Topics  . Smoking status: Never Smoker  . Smokeless tobacco: Not on file  . Alcohol use Yes     Comment: occ 1 every 2-3 months  . Drug use: No  . Sexual activity: Not on file   Other Topics Concern  . Not on file   Social History Narrative  . No narrative on file   FH: Mother with CHF, uncertain cause.   ROS: All systems reviewed and negative except as per HPI.   Current Outpatient Prescriptions  Medication Sig Dispense Refill  . calcium carbonate (TUMS - DOSED IN MG ELEMENTAL CALCIUM) 500 MG chewable tablet Chew 500 mg by mouth daily as needed for indigestion or heartburn.     . diphenhydrAMINE (SOMINEX) 25 MG tablet Take 25 mg by mouth at bedtime as needed. Reported on 05/20/2015    . famotidine (PEPCID) 20 MG tablet Take 20 mg by mouth daily.     Marland Kitchen liothyronine (CYTOMEL) 5 MCG tablet Take 5 mcg by mouth daily.    Marland Kitchen loratadine (CLARITIN) 10 MG tablet Take 10 mg by mouth daily.    Marland Kitchen  LORazepam (ATIVAN) 0.5 MG tablet Take one tablet by mouth at bedtime as needed for nausea or vomiting. 30 tablet 0  . Magnesium Citrate 100 MG TABS Take 200 mg by mouth daily.    . Methylcellulose, Laxative, (CITRUCEL) 500 MG TABS Take 2,000 mg by mouth 2 (two) times daily.    . naproxen sodium (ANAPROX) 220 MG tablet Take 440 mg by mouth 3 (three) times daily with meals.    Vladimir Faster Glycol-Propyl Glycol (SYSTANE ULTRA) 0.4-0.3 % SOLN Place 1 drop into both eyes daily as needed (dry eyes).    . Probiotic Product (ALIGN  PO) Take 1 tablet by mouth daily.     Marland Kitchen UNABLE TO FIND Place 2 Squirts into the nose 4 (four) times daily. Gentamicin 0.025% Mupirocin 0.2%    . VITAMIN D-VITAMIN K PO Take 1 capsule by mouth daily. Reported on 04/15/2015     No current facility-administered medications for this encounter.    Vitals:   07/28/15 1154  BP: (!) 144/86  Pulse: 79  SpO2: 98%  Weight: 173 lb 8 oz (78.7 kg)   Wt Readings from Last 3 Encounters:  07/28/15 173 lb 8 oz (78.7 kg)  07/15/15 174 lb 8 oz (79.2 kg)  07/07/15 171 lb 11.2 oz (77.9 kg)    General: NAD Neck: No JVD, no thyromegaly or thyroid nodule. No carotid bruit Lungs: CTAB, normal effort. CV: Nondisplaced PMI.  Heart regular S1/S2, no S3/S4, 1/6 SEM RUSB.   Left chest port-a-cath Abdomen: Soft, NT, ND, no HSM. No bruits or masses. +BS  Skin: Intact without lesions or rashes.  Neurologic: Alert and oriented x 3.  Psych: Normal affect. Extremities: No clubbing, cyanosis, rash, or edema. + pedal pulses. HEENT: Normal.   Assessment/plan: 1. Bilateral breast cancer, ER+/PR+/HER2+.   -- Echo today reviewed personally by Dr. Haroldine Laws.  Parameters stable.  -- To continue Herceptin therapy until 04/2016 -- Schedule for triple lumpectomy next month with Dr [name] Stage  Continue herceptin therapy. Follow up 3 months with echo.  Satira Mccallum Tillery PA-C 07/28/2015  I reviewed echos personally. EF and Doppler parameters stable. No HF on exam. Continue Herceptin.   Bensimhon, Daniel,MD 12:06 AM

## 2015-07-28 NOTE — Patient Instructions (Signed)
We will contact you in 3 months to schedule your next appointment.  

## 2015-07-28 NOTE — Progress Notes (Signed)
  Echocardiogram 2D Echocardiogram limited has been performed.  Tresa Res 07/28/2015, 11:42 AM

## 2015-07-29 ENCOUNTER — Encounter: Payer: Self-pay | Admitting: *Deleted

## 2015-07-29 ENCOUNTER — Ambulatory Visit (HOSPITAL_BASED_OUTPATIENT_CLINIC_OR_DEPARTMENT_OTHER): Payer: Medicare Other | Admitting: Oncology

## 2015-07-29 ENCOUNTER — Other Ambulatory Visit (HOSPITAL_BASED_OUTPATIENT_CLINIC_OR_DEPARTMENT_OTHER): Payer: Medicare Other

## 2015-07-29 ENCOUNTER — Ambulatory Visit (HOSPITAL_BASED_OUTPATIENT_CLINIC_OR_DEPARTMENT_OTHER): Payer: Medicare Other

## 2015-07-29 ENCOUNTER — Telehealth: Payer: Self-pay | Admitting: Oncology

## 2015-07-29 VITALS — BP 159/94 | HR 65 | Temp 97.7°F | Resp 18 | Ht 63.2 in | Wt 173.1 lb

## 2015-07-29 DIAGNOSIS — Z5112 Encounter for antineoplastic immunotherapy: Secondary | ICD-10-CM | POA: Diagnosis present

## 2015-07-29 DIAGNOSIS — C50511 Malignant neoplasm of lower-outer quadrant of right female breast: Secondary | ICD-10-CM

## 2015-07-29 DIAGNOSIS — C50412 Malignant neoplasm of upper-outer quadrant of left female breast: Secondary | ICD-10-CM

## 2015-07-29 DIAGNOSIS — R200 Anesthesia of skin: Secondary | ICD-10-CM

## 2015-07-29 DIAGNOSIS — M8580 Other specified disorders of bone density and structure, unspecified site: Secondary | ICD-10-CM

## 2015-07-29 LAB — COMPREHENSIVE METABOLIC PANEL
ALBUMIN: 4.1 g/dL (ref 3.5–5.0)
ALK PHOS: 64 U/L (ref 40–150)
ALT: 32 U/L (ref 0–55)
AST: 25 U/L (ref 5–34)
Anion Gap: 7 mEq/L (ref 3–11)
BUN: 10.3 mg/dL (ref 7.0–26.0)
CO2: 28 meq/L (ref 22–29)
Calcium: 10.2 mg/dL (ref 8.4–10.4)
Chloride: 107 mEq/L (ref 98–109)
Creatinine: 1.1 mg/dL (ref 0.6–1.1)
EGFR: 52 mL/min/{1.73_m2} — ABNORMAL LOW (ref >90)
GLUCOSE: 95 mg/dL (ref 70–140)
POTASSIUM: 4.5 meq/L (ref 3.5–5.1)
SODIUM: 142 meq/L (ref 136–145)
Total Bilirubin: 0.48 mg/dL (ref 0.20–1.20)
Total Protein: 7.5 g/dL (ref 6.4–8.3)

## 2015-07-29 LAB — CBC WITH DIFFERENTIAL/PLATELET
BASO%: 2.6 % — ABNORMAL HIGH (ref 0.0–2.0)
Basophils Absolute: 0.2 10*3/uL — ABNORMAL HIGH (ref 0.0–0.1)
EOS%: 9.4 % — AB (ref 0.0–7.0)
Eosinophils Absolute: 0.6 10*3/uL — ABNORMAL HIGH (ref 0.0–0.5)
HCT: 38.2 % (ref 34.8–46.6)
HEMOGLOBIN: 12.6 g/dL (ref 11.6–15.9)
LYMPH%: 19.2 % (ref 14.0–49.7)
MCH: 30.3 pg (ref 25.1–34.0)
MCHC: 33.1 g/dL (ref 31.5–36.0)
MCV: 91.7 fL (ref 79.5–101.0)
MONO#: 0.9 10*3/uL (ref 0.1–0.9)
MONO%: 14 % (ref 0.0–14.0)
NEUT%: 54.8 % (ref 38.4–76.8)
NEUTROS ABS: 3.7 10*3/uL (ref 1.5–6.5)
Platelets: 191 10*3/uL (ref 145–400)
RBC: 4.16 10*6/uL (ref 3.70–5.45)
RDW: 18.5 % — AB (ref 11.2–14.5)
WBC: 6.8 10*3/uL (ref 3.9–10.3)
lymph#: 1.3 10*3/uL (ref 0.9–3.3)

## 2015-07-29 MED ORDER — ACETAMINOPHEN 325 MG PO TABS
ORAL_TABLET | ORAL | Status: AC
  Filled 2015-07-29: qty 2

## 2015-07-29 MED ORDER — SODIUM CHLORIDE 0.9 % IV SOLN
Freq: Once | INTRAVENOUS | Status: AC
  Administered 2015-07-29: 13:00:00 via INTRAVENOUS

## 2015-07-29 MED ORDER — HEPARIN SOD (PORK) LOCK FLUSH 100 UNIT/ML IV SOLN
500.0000 [IU] | Freq: Once | INTRAVENOUS | Status: AC | PRN
  Administered 2015-07-29: 500 [IU]
  Filled 2015-07-29: qty 5

## 2015-07-29 MED ORDER — DIPHENHYDRAMINE HCL 50 MG/ML IJ SOLN
INTRAMUSCULAR | Status: AC
  Filled 2015-07-29: qty 1

## 2015-07-29 MED ORDER — ACETAMINOPHEN 325 MG PO TABS
650.0000 mg | ORAL_TABLET | Freq: Once | ORAL | Status: AC
  Administered 2015-07-29: 650 mg via ORAL

## 2015-07-29 MED ORDER — DIPHENHYDRAMINE HCL 50 MG/ML IJ SOLN
25.0000 mg | Freq: Once | INTRAMUSCULAR | Status: AC
  Administered 2015-07-29: 25 mg via INTRAVENOUS

## 2015-07-29 MED ORDER — SODIUM CHLORIDE 0.9% FLUSH
10.0000 mL | INTRAVENOUS | Status: DC | PRN
  Administered 2015-07-29: 10 mL
  Filled 2015-07-29: qty 10

## 2015-07-29 MED ORDER — TRASTUZUMAB CHEMO 150 MG IV SOLR
6.0000 mg/kg | Freq: Once | INTRAVENOUS | Status: AC
  Administered 2015-07-29: 483 mg via INTRAVENOUS
  Filled 2015-07-29: qty 23

## 2015-07-29 NOTE — Telephone Encounter (Signed)
appt made and avs printed °

## 2015-07-29 NOTE — Patient Instructions (Addendum)
[facility] Discharge Instructions for Patients  Today you received the following: Herceptin   To help prevent nausea and vomiting after your treatment, we encourage you to take your nausea medication as directed.   If you develop nausea and vomiting that is not controlled by your nausea medication, call the clinic.   BELOW ARE SYMPTOMS THAT SHOULD BE REPORTED IMMEDIATELY:  *FEVER GREATER THAN 100.5 F  *CHILLS WITH OR WITHOUT FEVER  NAUSEA AND VOMITING THAT IS NOT CONTROLLED WITH YOUR NAUSEA MEDICATION  *UNUSUAL SHORTNESS OF BREATH  *UNUSUAL BRUISING OR BLEEDING  TENDERNESS IN MOUTH AND THROAT WITH OR WITHOUT PRESENCE OF ULCERS  *URINARY PROBLEMS  *BOWEL PROBLEMS  UNUSUAL RASH Items with * indicate a potential emergency and should be followed up as soon as possible.  Feel free to call the clinic you have any questions or concerns. The clinic phone number is [phone].  Please show the CHEMO ALERT CARD at check-in to the Emergency Department and triage nurse.   

## 2015-07-29 NOTE — Progress Notes (Signed)
[place]  [phone] [phone]   ID: Tyriana Helmkamp DOB: 1940/10/27  MR#: [id number]  BMW#:[id number]  Patient Care Team: [name], FNP as PCP - General (Family Medicine) [name], MD as Consulting Physician (Oncology) [name], MD as Consulting Physician (General Surgery) [name], NP as Nurse Practitioner (Hematology and Oncology) [name], MD as Consulting Physician (Oncology) Thea Silversmith, MD as Consulting Physician (Radiation Oncology) [name], MD as Consulting Physician (Neurosurgery) [name], MD as Referring Physician (Otolaryngology) [name], MD as Consulting Physician (Cardiology) PCP: [name], FNP OTHER MD:  CHIEF COMPLAINT: HER-2 positive breast cancer, bilateral beast cancers  CURRENT TREATMENT: Paclitaxel, trastuzumab  BREAST CANCER HISTORY:   From the original intake note:  Isabella Cook had routine screening mammography at [facility] suggesting an area of indeterminate microcalcifications in the upper outer quadrant of the left breast measuring 8 mm. She was referred for left breast stereotactic biopsy performed at the [facility] 02/27/2015. This showed ([id number]) invasive ductal carcinoma, grade 2, estrogen receptor 100% positive, progesterone receptor 100% positive, both with strong staining intensity, with an MIB-1 of 20%, and HER-2 amplified with a signals ratio of 2.53, the number per cell being 2.65. 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  On 03/25/2015 the patient underwent bilateral breast MRI, which showed the breast density to be category B. this showed, in the left breast, an area of non-masslike enhancement measuring 7.8 cm. Associated with this were 2 adjacent irregular masses measuring 1.7 and 1.4 cm. The masses were 5 cm anterior and superior to the biopsy clip. There was a cortically thickened left axillary lymph node  noted.  Also in the right breast there was a 7 mm irregular enhancing mass at the 6:30 o'clock position.  Her subsequent history is as detailed below    INTERVAL HISTORY: Isabella Cook returns today for follow-up of her HER-2/neu positive breast cancer, accompanied by her husband, [name]. Today is the first of her Herceptin only treatments, which will be continued every 21 days through April of next year   REVIEW OF SYSTEMS: Tajanae has recovered nicely from her chemotherapy. She has been laying tile in her basement and doing a lot of vacuuming. She has a little bit of a sore throat which she picked up from a family member. She has occasional problems with heartburn. She still has a feeling of numbness in her right foot, but not in the left foot or hands. She is not aware of any prior trauma to the right foot or ankle and she has no problems with the right knee or hip area there is no swelling of the right ankle. Aside from these issues a detailed review of systems today was noncontributory  PAST MEDICAL HISTORY: Past Medical History:  Diagnosis Date  . Arthritis    wrists and knees  . Cancer ([facility]) 03-2015   left breast  . GERD (gastroesophageal reflux disease)   . History of hiatal hernia   . History of jaundice as a child   . Multiple food allergies   . Urinary tract infection     PAST SURGICAL HISTORY: Past Surgical History:  Procedure Laterality Date  . ABDOMINAL HYSTERECTOMY    . APPENDECTOMY    . BACK SURGERY     lumbar  . [facility]  2016  . CHOLECYSTECTOMY    . HERNIA REPAIR     UHR  . PORTACATH PLACEMENT Right 04/22/2015   Procedure: INSERTION PORT-A-CATH  WITH [place];  Surgeon: [name], MD;  Location: [facility];  Service: General;  Laterality: Right;  . TUBAL LIGATION      FAMILY HISTORY No family history on file. The patient's father had a history of pseudo-bulbar pulse 74 and died from a stroke at age 74. The patient's mother died at age 74 from complications of  emphysema. The patient had no brothers, 2 sisters. There is no history of cancer in the family and specifically no history of breast or ovarian cancer.  GYNECOLOGIC HISTORY:  No LMP recorded. Patient has had a hysterectomy. Menarche age 8, first live birth age 84. She is GX P2. She underwent a hysterectomy with left salpingo-oophorectomy in 1984. She received hormone replacement for approximately 15 years, until 2005.  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 Her husband [name] is a retired Social research officer, government. Daughter [name] lives in [place] where she is a housewife, homeschooling her children. Son Annalyce Lanpher lives in [place] and works in Chartered certified accountant. The patient has 2 grandchildren. She is not a church attender    ADVANCED DIRECTIVES: In place   HEALTH MAINTENANCE: Social History  Substance Use Topics  . Smoking status: Never Smoker  . Smokeless tobacco: Not on file  . Alcohol use Yes     Comment: occ 1 every 2-3 months     Colonoscopy:2014  PAP:  Bone density: 2017 [facility]//osteopenia   Lipid panel:  Allergies  Allergen Reactions  . Dairy Aid [Lactase] Anaphylaxis     Takes claritin every day and benadryl, if needed, to prevent anaphylaxis   . Eggs Or Egg-Derived Products Anaphylaxis    Nasal stuffiness, Takes claritin every day and benadryl, if needed, to prevent anaphylaxis  . Gelatin (Bovine) [Beef Extract] Anaphylaxis    Muscle pain,  Takes claritin every day and benadryl, if needed, to prevent anaphylaxis   . Gluten Meal Anaphylaxis and Diarrhea    Takes claritin every day and benadryl, if needed, to prevent anaphylaxis   . Lambs Quarters Anaphylaxis    ALL "mammal" MEAT per pt -- Muscle pain  Can eat chicken, fish, [place]  . Milk-Related Compounds Anaphylaxis    Nasal stuffiness Cheese (mozzarella, swiss, cheddar, cottage)  . Pork Allergy Anaphylaxis     ALL "mammal" MEAT per pt -- Muscle pain  Can eat chicken, fish, [place]  . Wheat Bran Anaphylaxis and Diarrhea     Takes claritin every day and benadryl, if needed, to prevent anaphylaxis   . Whey Anaphylaxis and Diarrhea     Takes claritin every day and benadryl, if needed, to prevent anaphylaxis   . Yeast Anaphylaxis     Takes claritin every day and benadryl, if needed, to prevent anaphylaxis  . Soy Allergy Diarrhea  . Almond Oil Nausea And Vomiting    Current Outpatient Prescriptions  Medication Sig Dispense Refill  . calcium carbonate (TUMS - DOSED IN MG ELEMENTAL CALCIUM) 500 MG chewable tablet Chew 500 mg by mouth daily as needed for indigestion or heartburn.     . diphenhydrAMINE (SOMINEX) 25 MG tablet Take 25 mg by mouth at bedtime as needed. Reported on 05/20/2015    . famotidine (PEPCID) 20 MG tablet Take 20 mg by mouth daily.     Marland Kitchen liothyronine (CYTOMEL) 5 MCG tablet Take 5 mcg by mouth daily.    Marland Kitchen loratadine (CLARITIN) 10 MG tablet Take 10 mg by mouth daily.    Marland Kitchen  LORazepam (ATIVAN) 0.5 MG tablet Take one tablet by mouth at bedtime as needed for nausea or vomiting. 30 tablet 0  . Magnesium Citrate 100 MG TABS Take 200 mg by mouth daily.    . Methylcellulose, Laxative, (CITRUCEL) 500 MG TABS Take 2,000 mg by mouth 2 (two) times daily.    . naproxen sodium (ANAPROX) 220 MG tablet Take 440 mg by mouth 3 (three) times daily with meals.    Vladimir Faster Glycol-Propyl Glycol (SYSTANE ULTRA) 0.4-0.3 % SOLN Place 1 drop into both eyes daily as needed (dry eyes).    . Probiotic Product (ALIGN PO) Take 1 tablet by mouth daily.     Marland Kitchen UNABLE TO FIND Place 2 Squirts into the nose 4 (four) times daily. Gentamicin 0.025% Mupirocin 0.2%    . VITAMIN D-VITAMIN K PO Take 1 capsule by mouth daily. Reported on 04/15/2015     No current facility-administered medications for this visit.     OBJECTIVE: Middle-aged white woman Who appears stated age 28:   07/29/15 1133  BP: (!) 159/94  Pulse:  65  Resp: 18  Temp: 97.7 F (36.5 C)     Body mass index is 30.47 kg/m.    ECOG FS:1 - Symptomatic but completely ambulatory  Sclerae unicteric, EOMs intact Oropharynx clear and moist No cervical or supraclavicular adenopathy Lungs no rales or rhonchi Heart regular rate and rhythm Abd soft, nontender, positive bowel sounds MSK no focal spinal tenderness, no upper extremity lymphedema Neuro: nonfocal, well oriented, appropriate affect Breasts: Deferred  LAB RESULTS:  CMP     Component Value Date/Time   NA 142 07/29/2015 1104   K 4.5 07/29/2015 1104   CO2 28 07/29/2015 1104   GLUCOSE 95 07/29/2015 1104   BUN 10.3 07/29/2015 1104   CREATININE 1.1 07/29/2015 1104   CALCIUM 10.2 07/29/2015 1104   PROT 7.5 07/29/2015 1104   ALBUMIN 4.1 07/29/2015 1104   AST 25 07/29/2015 1104   ALT 32 07/29/2015 1104   ALKPHOS 64 07/29/2015 1104   BILITOT 0.48 07/29/2015 1104    INo results found for: SPEP, UPEP  Lab Results  Component Value Date   WBC 6.8 07/29/2015   NEUTROABS 3.7 07/29/2015   HGB 12.6 07/29/2015   HCT 38.2 07/29/2015   MCV 91.7 07/29/2015   PLT 191 07/29/2015      Chemistry      Component Value Date/Time   NA 142 07/29/2015 1104   K 4.5 07/29/2015 1104   CO2 28 07/29/2015 1104   BUN 10.3 07/29/2015 1104   CREATININE 1.1 07/29/2015 1104      Component Value Date/Time   CALCIUM 10.2 07/29/2015 1104   ALKPHOS 64 07/29/2015 1104   AST 25 07/29/2015 1104   ALT 32 07/29/2015 1104   BILITOT 0.48 07/29/2015 1104       No results found for: LABCA2  No components found for: LABCA125  No results for input(s): INR in the last 168 hours.  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 MRI guided biopsy of a irregular right breast mass demonstrated invasive mammary carcinoma  and mammary carcinoma in situ. The patient is seen for evaluation following neoadjuvant therapy. LABS:  No labs were performed at the imaging center today. EXAM: BILATERAL BREAST MRI WITH AND WITHOUT CONTRAST TECHNIQUE: Multiplanar, multisequence MR images of both breasts were obtained prior to and following the intravenous administration of 14 ml of MultiHance. THREE-DIMENSIONAL MR IMAGE RENDERING ON INDEPENDENT WORKSTATION: Three-dimensional MR images were rendered by post-processing of the original MR data on an independent workstation. The three-dimensional MR images were interpreted, and findings are reported in the following complete MRI report for this study. Three dimensional images were evaluated at the independent DynaCad workstation COMPARISON:  Previous exam(s) including breast MRI dated 03/25/2015. FINDINGS: Breast composition: c. Heterogeneous fibroglandular tissue. Background parenchymal enhancement: Minimal Right breast: No suspicious mass or enhancement is seen within the right breast. Specifically, no abnormal enhancement is noted at the site of the biopsy proven right breast carcinoma. Artifact from the right breast biopsy marker is seen on series 3, image 116. Left breast: Biopsy marker artifact is again seen within the lower, outer left breast (series 3, image 80). New biopsy marker artifact from the MRI guided left breast biopsy is seen on series 3, image 56. This artifact is just superior and lateral to the targeted irregular enhancing mass which is unchanged in appearance from prior exam measuring 1.4 x 1.1 x 1.2 cm. The adjacent previously noted irregular enhancing mass  is also unchanged in size measuring 1.7 x 1.0 x 1.1 cm. The previously noted extensive non mass enhancement involving the upper, outer and lower, outer left breast is not significantly changed in size or appearance from prior exam, again measuring 7.8 x 3.6 x 3.3 cm. Lymph nodes: The previously noted prominent left axillary lymph node is unchanged from prior MRI. Second-look targeted ultrasound of the left axilla demonstrated no suspicious appearing axillary lymph nodes. Ancillary findings: A possible 7 mm nodule is noted within the peripheral left lower lobe (series 5, image 92). IMPRESSION: 1. No abnormal enhancement is identified at the site of the MRI biopsy proven right breast carcinoma. 2. No significant interval change in the previously noted large area of extensive non mass enhancement within the upper and lower outer left breast. The previously noted irregular enhancing masses along the superior aspect of this non mass enhancement are unchanged in size. 3. Possible 7 mm nodule in the peripheral left lower lobe. RECOMMENDATION: 1. Treatment plan. 2. Consider further evaluation with chest CT for the possible left lower lobe pulmonary nodule. These results will be called to the ordering clinician or representative by the Radiologist Assistant, and communication documented in the PACS or zVision Dashboard. BI-RADS CATEGORY  6: Known biopsy-proven malignancy. Electronically Signed   By: Pamelia Hoit M.D.   On: 07/10/2015 15:06    ASSESSMENT: 75 y.o. [facility] woman  status post left breast upper outer quadrant biopsy 02/27/2015 for a clinical mT1-3 N1 invasive ductal carcinoma, grade 2, strongly estrogen and progesterone receptor positive, HER-2 amplified, with an MIB-1 of 20%.   (a) follow-up ultrasound of the left axilla 04/04/2015 found no abnormal lymph node to biopsy  (1) bilateral breast MRI 03/25/2015 shows, in addition to a large area of non-masslike enhancement in the left breast, a 0.7 cm mass in  the lower outer quadrant of the contralateral, right breast; biopsy of both these areas was performed 04/02/2015, showing  (a) left breast upper outer quadrant: atypical ductal hyperplasia  (b) right breast lower outer quadrant: a clinical T1b N0, stage IA invasive  ductal carcinoma, estrogen and progesterone receptor positive, HER-2 not amplified, with an Mib-1 of 4%  (2)  neoadjuvant chemotherapy consisting of weekly paclitaxel 12 together with weekly trastuzumab started 04/29/2015  (a) 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  (a) echo 07/24//2017 shows an ejection fraction of 55-60%   (4) definitive surgery to follow chemotherapy  (5) adjuvant radiation therapy to follow surgery  (6) tamoxifen to start at the completion of local treatment  (a) the patient has been asked to discontinue Estring as of 05/05/2015, to be resumed when tamoxifen is started   (7) incidentally noted left lower lobe 0.7 cm nodule  on 07/10/2015 MRI to be followed   PLAN: Isabella Cook has recovered nicely from her chemotherapy, with excellent counts and an excellent functional status today. The only residual issue is some numbness in the right foot. This is very focal, and I don't have any explanation why she would have some nerve damage there as opposed to elsewhere. She has no knee or hip problems. We will simply continue to follow that.  Today we reviewed the images of her breast MRI. She is scheduled for surgery 08/07/2015  She will receive Herceptin today and again in 3 weeks from now. She will see me in 3 weeks, by which time we will have her final pathology to discuss  At that point of course she will be referred to radiation to complete her local treatment, after which we will resume tamoxifen  She has a good  understanding of this plan. She agrees with it. She knows a goal of treatment in her case is cure. She will call with any problems that may develop before her next visit here.    [name], MD   07/29/2015 11:51 AM

## 2015-08-01 ENCOUNTER — Encounter (HOSPITAL_BASED_OUTPATIENT_CLINIC_OR_DEPARTMENT_OTHER): Payer: Self-pay | Admitting: *Deleted

## 2015-08-04 ENCOUNTER — Encounter (HOSPITAL_BASED_OUTPATIENT_CLINIC_OR_DEPARTMENT_OTHER): Payer: Self-pay | Admitting: *Deleted

## 2015-08-05 ENCOUNTER — Other Ambulatory Visit: Payer: Self-pay | Admitting: Surgery

## 2015-08-05 DIAGNOSIS — C50212 Malignant neoplasm of upper-inner quadrant of left female breast: Principal | ICD-10-CM

## 2015-08-05 DIAGNOSIS — C50211 Malignant neoplasm of upper-inner quadrant of right female breast: Secondary | ICD-10-CM

## 2015-08-06 ENCOUNTER — Ambulatory Visit
Admission: RE | Admit: 2015-08-06 | Discharge: 2015-08-06 | Disposition: A | Discharge status: Home / Self Care | Payer: Medicare Other | Source: Ambulatory Visit | Attending: Surgery | Admitting: Surgery

## 2015-08-06 DIAGNOSIS — C50212 Malignant neoplasm of upper-inner quadrant of left female breast: Principal | ICD-10-CM

## 2015-08-06 DIAGNOSIS — C50211 Malignant neoplasm of upper-inner quadrant of right female breast: Secondary | ICD-10-CM

## 2015-08-06 NOTE — Progress Notes (Signed)
Boost drink given with instructions to complete by 0530, verbalized understanding.

## 2015-08-07 ENCOUNTER — Ambulatory Visit (HOSPITAL_BASED_OUTPATIENT_CLINIC_OR_DEPARTMENT_OTHER): Payer: Medicare Other | Admitting: Anesthesiology

## 2015-08-07 ENCOUNTER — Ambulatory Visit
Admission: RE | Admit: 2015-08-07 | Discharge: 2015-08-07 | Disposition: A | Discharge status: Home / Self Care | Payer: Medicare Other | Source: Ambulatory Visit | Attending: Surgery | Admitting: Surgery

## 2015-08-07 ENCOUNTER — Encounter (HOSPITAL_BASED_OUTPATIENT_CLINIC_OR_DEPARTMENT_OTHER): Payer: Self-pay

## 2015-08-07 ENCOUNTER — Ambulatory Visit (HOSPITAL_BASED_OUTPATIENT_CLINIC_OR_DEPARTMENT_OTHER)
Admission: RE | Admit: 2015-08-07 | Discharge: 2015-08-07 | Disposition: A | Discharge status: Home / Self Care | Payer: Medicare Other | Source: Ambulatory Visit | Attending: Surgery | Admitting: Surgery

## 2015-08-07 ENCOUNTER — Encounter (HOSPITAL_BASED_OUTPATIENT_CLINIC_OR_DEPARTMENT_OTHER)
Admission: RE | Disposition: A | Discharge status: Home / Self Care | Payer: Self-pay | Source: Ambulatory Visit | Attending: Surgery

## 2015-08-07 ENCOUNTER — Ambulatory Visit (HOSPITAL_COMMUNITY)
Admission: RE | Admit: 2015-08-07 | Discharge: 2015-08-07 | Disposition: A | Discharge status: Home / Self Care | Payer: Medicare Other | Source: Ambulatory Visit | Attending: Surgery | Admitting: Surgery

## 2015-08-07 DIAGNOSIS — Z9221 Personal history of antineoplastic chemotherapy: Secondary | ICD-10-CM | POA: Diagnosis not present

## 2015-08-07 DIAGNOSIS — N6091 Unspecified benign mammary dysplasia of right breast: Secondary | ICD-10-CM | POA: Insufficient documentation

## 2015-08-07 DIAGNOSIS — C50211 Malignant neoplasm of upper-inner quadrant of right female breast: Secondary | ICD-10-CM

## 2015-08-07 DIAGNOSIS — E039 Hypothyroidism, unspecified: Secondary | ICD-10-CM | POA: Diagnosis not present

## 2015-08-07 DIAGNOSIS — K219 Gastro-esophageal reflux disease without esophagitis: Secondary | ICD-10-CM | POA: Diagnosis not present

## 2015-08-07 DIAGNOSIS — Z79890 Hormone replacement therapy (postmenopausal): Secondary | ICD-10-CM | POA: Diagnosis not present

## 2015-08-07 DIAGNOSIS — C50512 Malignant neoplasm of lower-outer quadrant of left female breast: Secondary | ICD-10-CM | POA: Insufficient documentation

## 2015-08-07 DIAGNOSIS — C50212 Malignant neoplasm of upper-inner quadrant of left female breast: Principal | ICD-10-CM

## 2015-08-07 DIAGNOSIS — D0512 Intraductal carcinoma in situ of left breast: Secondary | ICD-10-CM | POA: Insufficient documentation

## 2015-08-07 DIAGNOSIS — Z170 Estrogen receptor positive status [ER+]: Secondary | ICD-10-CM | POA: Diagnosis not present

## 2015-08-07 DIAGNOSIS — Z791 Long term (current) use of non-steroidal anti-inflammatories (NSAID): Secondary | ICD-10-CM | POA: Insufficient documentation

## 2015-08-07 DIAGNOSIS — C50911 Malignant neoplasm of unspecified site of right female breast: Secondary | ICD-10-CM | POA: Insufficient documentation

## 2015-08-07 DIAGNOSIS — C773 Secondary and unspecified malignant neoplasm of axilla and upper limb lymph nodes: Secondary | ICD-10-CM | POA: Insufficient documentation

## 2015-08-07 DIAGNOSIS — C50912 Malignant neoplasm of unspecified site of left female breast: Secondary | ICD-10-CM | POA: Diagnosis present

## 2015-08-07 DIAGNOSIS — Z79899 Other long term (current) drug therapy: Secondary | ICD-10-CM | POA: Diagnosis not present

## 2015-08-07 DIAGNOSIS — C50412 Malignant neoplasm of upper-outer quadrant of left female breast: Secondary | ICD-10-CM | POA: Insufficient documentation

## 2015-08-07 HISTORY — PX: BREAST LUMPECTOMY WITH RADIOACTIVE SEED AND SENTINEL LYMPH NODE BIOPSY: SHX6550

## 2015-08-07 HISTORY — DX: Hypothyroidism, unspecified: E03.9

## 2015-08-07 SURGERY — BREAST LUMPECTOMY WITH RADIOACTIVE SEED AND SENTINEL LYMPH NODE BIOPSY
Anesthesia: General | Site: Breast | Laterality: Bilateral

## 2015-08-07 MED ORDER — MIDAZOLAM HCL 2 MG/2ML IJ SOLN
INTRAMUSCULAR | Status: AC
  Filled 2015-08-07: qty 2

## 2015-08-07 MED ORDER — DIPHENHYDRAMINE HCL 50 MG/ML IJ SOLN
INTRAMUSCULAR | Status: DC | PRN
  Administered 2015-08-07: 12.5 mg via INTRAVENOUS

## 2015-08-07 MED ORDER — DEXAMETHASONE SODIUM PHOSPHATE 10 MG/ML IJ SOLN
INTRAMUSCULAR | Status: AC
  Filled 2015-08-07: qty 1

## 2015-08-07 MED ORDER — CEFAZOLIN SODIUM-DEXTROSE 2-4 GM/100ML-% IV SOLN
INTRAVENOUS | Status: AC
  Filled 2015-08-07: qty 100

## 2015-08-07 MED ORDER — FENTANYL CITRATE (PF) 100 MCG/2ML IJ SOLN
INTRAMUSCULAR | Status: AC
  Filled 2015-08-07: qty 2

## 2015-08-07 MED ORDER — FENTANYL CITRATE (PF) 100 MCG/2ML IJ SOLN
25.0000 ug | INTRAMUSCULAR | Status: DC | PRN
  Administered 2015-08-07: 25 ug via INTRAVENOUS

## 2015-08-07 MED ORDER — GLYCOPYRROLATE 0.2 MG/ML IJ SOLN: 0.2000 mg | Freq: Once | INTRAMUSCULAR | Status: DC | PRN

## 2015-08-07 MED ORDER — MIDAZOLAM HCL 2 MG/2ML IJ SOLN: 1.0000 mg | INTRAMUSCULAR | Status: DC | PRN

## 2015-08-07 MED ORDER — LACTATED RINGERS IV SOLN
INTRAVENOUS | Status: DC
  Administered 2015-08-07 (×2): via INTRAVENOUS

## 2015-08-07 MED ORDER — CELECOXIB 400 MG PO CAPS
400.0000 mg | ORAL_CAPSULE | ORAL | Status: AC
  Administered 2015-08-07: 400 mg via ORAL

## 2015-08-07 MED ORDER — CELECOXIB 200 MG PO CAPS
ORAL_CAPSULE | ORAL | Status: AC
  Filled 2015-08-07: qty 2

## 2015-08-07 MED ORDER — OXYCODONE HCL 5 MG PO TABS
ORAL_TABLET | ORAL | Status: AC
  Filled 2015-08-07: qty 1

## 2015-08-07 MED ORDER — SCOPOLAMINE 1 MG/3DAYS TD PT72: 1.0000 | MEDICATED_PATCH | Freq: Once | TRANSDERMAL | Status: DC | PRN

## 2015-08-07 MED ORDER — EPHEDRINE SULFATE 50 MG/ML IJ SOLN
INTRAMUSCULAR | Status: DC | PRN
  Administered 2015-08-07 (×2): 10 mg via INTRAVENOUS

## 2015-08-07 MED ORDER — CHLORHEXIDINE GLUCONATE CLOTH 2 % EX PADS: 6.0000 | MEDICATED_PAD | Freq: Once | CUTANEOUS | Status: DC

## 2015-08-07 MED ORDER — ONDANSETRON HCL 4 MG/2ML IJ SOLN
INTRAMUSCULAR | Status: AC
  Filled 2015-08-07: qty 2

## 2015-08-07 MED ORDER — BUPIVACAINE-EPINEPHRINE (PF) 0.25% -1:200000 IJ SOLN
INTRAMUSCULAR | Status: DC | PRN
  Administered 2015-08-07: 40 mL

## 2015-08-07 MED ORDER — OXYCODONE HCL 5 MG PO TABS
5.0000 mg | ORAL_TABLET | Freq: Once | ORAL | Status: AC
  Administered 2015-08-07: 5 mg via ORAL

## 2015-08-07 MED ORDER — TECHNETIUM TC 99M SULFUR COLLOID FILTERED
1.0000 | Freq: Once | INTRAVENOUS | Status: AC | PRN
  Administered 2015-08-07: 1 via INTRADERMAL

## 2015-08-07 MED ORDER — BUPIVACAINE-EPINEPHRINE (PF) 0.25% -1:200000 IJ SOLN
INTRAMUSCULAR | Status: DC | PRN
  Administered 2015-08-07: 14 mL

## 2015-08-07 MED ORDER — GABAPENTIN 300 MG PO CAPS
300.0000 mg | ORAL_CAPSULE | ORAL | Status: AC
  Administered 2015-08-07: 300 mg via ORAL

## 2015-08-07 MED ORDER — GABAPENTIN 300 MG PO CAPS
ORAL_CAPSULE | ORAL | Status: AC
  Filled 2015-08-07: qty 1

## 2015-08-07 MED ORDER — ACETAMINOPHEN 500 MG PO TABS
ORAL_TABLET | ORAL | Status: AC
Start: 2015-08-07 — End: 2015-08-07
  Filled 2015-08-07: qty 2

## 2015-08-07 MED ORDER — PROPOFOL 10 MG/ML IV BOLUS
INTRAVENOUS | Status: DC | PRN
  Administered 2015-08-07: 100 mg via INTRAVENOUS

## 2015-08-07 MED ORDER — OXYCODONE-ACETAMINOPHEN 5-325 MG PO TABS: 1.0000 | ORAL_TABLET | ORAL | 0 refills | Status: DC | PRN

## 2015-08-07 MED ORDER — ETOMIDATE 2 MG/ML IV SOLN
INTRAVENOUS | Status: DC | PRN
  Administered 2015-08-07: 16 mg via INTRAVENOUS
  Administered 2015-08-07: 4 mg via INTRAVENOUS

## 2015-08-07 MED ORDER — ONDANSETRON HCL 4 MG/2ML IJ SOLN: 4.0000 mg | Freq: Once | INTRAMUSCULAR | Status: DC | PRN

## 2015-08-07 MED ORDER — ETOMIDATE 2 MG/ML IV SOLN
INTRAVENOUS | Status: AC
  Filled 2015-08-07: qty 10

## 2015-08-07 MED ORDER — ONDANSETRON HCL 4 MG/2ML IJ SOLN
INTRAMUSCULAR | Status: DC | PRN
  Administered 2015-08-07: 4 mg via INTRAVENOUS

## 2015-08-07 MED ORDER — FENTANYL CITRATE (PF) 100 MCG/2ML IJ SOLN
50.0000 ug | INTRAMUSCULAR | Status: AC | PRN
  Administered 2015-08-07 (×2): 25 ug via INTRAVENOUS
  Administered 2015-08-07: 50 ug via INTRAVENOUS
  Administered 2015-08-07: 25 ug via INTRAVENOUS
  Administered 2015-08-07: 50 ug via INTRAVENOUS
  Administered 2015-08-07: 25 ug via INTRAVENOUS

## 2015-08-07 MED ORDER — ACETAMINOPHEN 500 MG PO TABS
1000.0000 mg | ORAL_TABLET | ORAL | Status: AC
  Administered 2015-08-07: 1000 mg via ORAL

## 2015-08-07 MED ORDER — METHYLENE BLUE 0.5 % INJ SOLN
INTRAVENOUS | Status: DC | PRN
  Administered 2015-08-07: 10 mL via INTRAMUSCULAR

## 2015-08-07 MED ORDER — DEXAMETHASONE SODIUM PHOSPHATE 4 MG/ML IJ SOLN
INTRAMUSCULAR | Status: DC | PRN
  Administered 2015-08-07: 10 mg via INTRAVENOUS

## 2015-08-07 MED ORDER — DEXTROSE 5 % IV SOLN
3.0000 g | INTRAVENOUS | Status: AC
  Administered 2015-08-07: 2 g via INTRAVENOUS

## 2015-08-07 MED ORDER — LIDOCAINE 2% (20 MG/ML) 5 ML SYRINGE
INTRAMUSCULAR | Status: DC | PRN
  Administered 2015-08-07: 20 mg via INTRAVENOUS

## 2015-08-07 SURGICAL SUPPLY — 44 items
APPLIER CLIP 9.375 MED OPEN (MISCELLANEOUS) ×2
BINDER BREAST LRG (GAUZE/BANDAGES/DRESSINGS) ×2 IMPLANT
BLADE SURG 15 STRL LF DISP TIS (BLADE) ×1 IMPLANT
BLADE SURG 15 STRL SS (BLADE) ×1
CANISTER SUC SOCK COL 7IN (MISCELLANEOUS) IMPLANT
CANISTER SUCT 1200ML W/VALVE (MISCELLANEOUS) ×2 IMPLANT
CHLORAPREP W/TINT 26ML (MISCELLANEOUS) ×2 IMPLANT
CLIP APPLIE 9.375 MED OPEN (MISCELLANEOUS) ×1 IMPLANT
COVER BACK TABLE 60X90IN (DRAPES) ×2 IMPLANT
COVER MAYO STAND STRL (DRAPES) ×2 IMPLANT
COVER PROBE W GEL 5X96 (DRAPES) ×2 IMPLANT
DECANTER SPIKE VIAL GLASS SM (MISCELLANEOUS) IMPLANT
DEVICE DUBIN W/COMP PLATE 8390 (MISCELLANEOUS) ×2 IMPLANT
DRAPE LAPAROSCOPIC ABDOMINAL (DRAPES) ×2 IMPLANT
DRAPE UTILITY XL STRL (DRAPES) ×2 IMPLANT
ELECT COATED BLADE 2.86 ST (ELECTRODE) ×2 IMPLANT
ELECT REM PT RETURN 9FT ADLT (ELECTROSURGICAL) ×2
ELECTRODE REM PT RTRN 9FT ADLT (ELECTROSURGICAL) ×1 IMPLANT
GLOVE BIOGEL PI IND STRL 7.0 (GLOVE) ×2 IMPLANT
GLOVE BIOGEL PI IND STRL 8 (GLOVE) ×1 IMPLANT
GLOVE BIOGEL PI INDICATOR 7.0 (GLOVE) ×2
GLOVE BIOGEL PI INDICATOR 8 (GLOVE) ×1
GLOVE ECLIPSE 6.5 STRL STRAW (GLOVE) ×2 IMPLANT
GLOVE ECLIPSE 8.0 STRL XLNG CF (GLOVE) ×4 IMPLANT
GOWN STRL REUS W/ TWL LRG LVL3 (GOWN DISPOSABLE) ×2 IMPLANT
GOWN STRL REUS W/TWL LRG LVL3 (GOWN DISPOSABLE) ×2
HEMOSTAT SNOW SURGICEL 2X4 (HEMOSTASIS) ×2 IMPLANT
KIT MARKER MARGIN INK (KITS) ×2 IMPLANT
LIQUID BAND (GAUZE/BANDAGES/DRESSINGS) ×2 IMPLANT
NDL SAFETY ECLIPSE 18X1.5 (NEEDLE) IMPLANT
NEEDLE HYPO 18GX1.5 SHARP (NEEDLE)
NEEDLE HYPO 25X1 1.5 SAFETY (NEEDLE) ×2 IMPLANT
NS IRRIG 1000ML POUR BTL (IV SOLUTION) ×2 IMPLANT
PACK BASIN DAY SURGERY FS (CUSTOM PROCEDURE TRAY) ×2 IMPLANT
PENCIL BUTTON HOLSTER BLD 10FT (ELECTRODE) ×2 IMPLANT
SLEEVE SCD COMPRESS KNEE MED (MISCELLANEOUS) ×2 IMPLANT
SPONGE LAP 4X18 X RAY DECT (DISPOSABLE) ×4 IMPLANT
SUT MNCRL AB 4-0 PS2 18 (SUTURE) ×4 IMPLANT
SUT VICRYL 3-0 CR8 SH (SUTURE) ×4 IMPLANT
SYR CONTROL 10ML LL (SYRINGE) ×2 IMPLANT
TOWEL OR 17X24 6PK STRL BLUE (TOWEL DISPOSABLE) ×2 IMPLANT
TOWEL OR NON WOVEN STRL DISP B (DISPOSABLE) ×2 IMPLANT
TUBE CONNECTING 20X1/4 (TUBING) ×2 IMPLANT
YANKAUER SUCT BULB TIP NO VENT (SUCTIONS) ×2 IMPLANT

## 2015-08-07 NOTE — Anesthesia Procedure Notes (Signed)
Anesthesia Regional Block:  Pectoralis block  Pre-Anesthetic Checklist: ,, timeout performed, Correct Patient, Correct Site, Correct Laterality, Correct Procedure, Correct Position, site marked, Risks and benefits discussed,  Surgical consent,  Pre-op evaluation,  At surgeon's request and post-op pain management  Laterality: Right and Left  Prep: Maximum Sterile Barrier Precautions used, chloraprep       Needles:  Injection technique: Single-shot  Needle Type: Echogenic Stimulator Needle     Needle Length: 10cm 10 cm Needle Gauge: 21 G    Additional Needles:  Procedures: ultrasound guided (picture in chart) and nerve stimulator Pectoralis block Narrative:  Injection made incrementally with aspirations every 5 mL.  Performed by: Personally  Anesthesiologist: Rashonda Warrior  Additional Notes: Patient tolerated the procedure well without complications  Bilateral pec block

## 2015-08-07 NOTE — Op Note (Signed)
Preoperative diagnosis: Bilateral breast cancer  Postoperative diagnosis: Same  Procedure: Left breast seed localized partial mastectomy 2 with right breast seed localized partial mastectomy and bilateral sentinel lymph node mapping with methylene blue dye  Surgeon: [name] M.D.  Anesthesia: LMA with bilateral pectoral blocks per anesthesia and 0.25% Sensorcaine local with epinephrine  EBL: Minimal  Specimens: Left breast mass 2 with additional anterior margin right breast mass 1 with additional superior margin bilateral sentinel nodes to pathology  Drains: None  Indications for procedure: The patient presents for bilateral lumpectomies after neoadjuvant chemotherapy. Patient had a strong desire for breast conservation. She is seeing plastic surgery and they plan on doing a bilateral breast reduction for local plastic affect. She is been marked by the plastic surgeon. She has 2 areas in the left breast and one in the right breast need to be removed. Left breast tumor is in the left upper outer and left lower outer quadrants. The right breast tumor is in the central lower right breast. Overall less than 2 cm. She's had a good response to neoadjuvant chemotherapy. She requires bilateral sentinel lymph node mapping. She is ready to proceed. Risks, benefits and alternative therapies have been discussed with the patient.The procedure has been discussed with the patient. Alternatives to surgery have been discussed with the patient.  Risks of surgery include bleeding,  Infection,  Seroma formation, death,  and the need for further surgery.   The patient understands and wishes to proceed.Sentinel lymph node mapping and dissection has been discussed with the patient.  Risk of bleeding,  Infection,  Seroma formation,  Additional procedures,,  Shoulder weakness , arm swelling  Shoulder stiffness,  Nerve and blood vessel injury and reaction to the mapping dyes have been discussed.  Alternatives to  surgery have been discussed with the patient.  The patient agrees to proceed.  Description of procedure: The patient was met in the holding area and questions are answered. The neoprobe was used to identify all recedes. She underwent injection of both breasts by nuclear medicine. Questions are answered. She was marked preoperatively I plastic surgery. She was taken back to the operating room placed upon the OR table. After induction of LMA anesthesia, upper chest regions were prepped and draped bilaterally in a sterile fashion. Timeout was done and she received preoperative antibiotics. 4 mL of methylene blue dye were injected under each nipple mapping for the procedure. The left side was done first. Neoprobe was used to identify both cc and these were marked on the skin. Keyhole configuration but the Psychiatric nurse had drawn for future all plastic procedure was placed. I used the lateral transverse heart of the marking for my incision since this was the closest of both areas. Incision was made in the left lateral breast where the markings were and all tissue around both seed in clips were removed. I removed them separately removing the left inferior mass first. Radiograph revealed the seating clip to be present. The second area which was more superior-medial was removed as well. Both had gross negative margins. I removed some additional anterior tissue above biosynthesis appeared to be the closest margin grossly. Clips are placed. Hemostasis achieved. Wound was closed with 3-0 Vicryl and 4-0 Monocryl. The left sentinel node was done next. The neoprobe settings were changed to facilitate this. Incision made a left axilla. This was about 3 cm. Dissection was carried down and 2 hot sentinel nodes were identified and removed. Background counts approached 0. They were not blue.  I saw no evidence of blue dye in the left axilla. Once hemostasis was achieved with cautery the wound was closed with 3-0 Vicryl 4-0  Monocryl.  Right side was done next. Lesion was in the lower central breast. This side was also marked by the plastic surgeon. I placed my incision in the inferior mammary fold part of marking. Dissection was carried superiorly and the lesion was identified and excised with grossly negative margins. The radiograph showed the superior margin of the closest an additional superior margin was taken. Balloon was found hemostatic and closed with 3-0 Vicryl and 4-0 Monocryl. The right sentinel node was done next. With the neoprobe switch back to the technetium settings, incision was made in the right axilla. This was 3 cm. Dissection was carried down to the right extra contents and 2 blue and hot sentinel nodes were identified. These were removed. I saw no evidence of any other abnormality neoprobe counts were correct background. Surgicel snow was placed the right axillary region for hemostasis as well as use of cautery. The wound was closed with 3-0 Vicryl and 4-0 Monocryl. Liquid adhesive was applied to all incisions and a breast binder was placed. All final counts are found to be correct. All seeds had been received by radiology and this was confirmed. All radiographs were reviewed with the radiologist which showed the seating clip to be in all specimens. Patient was extubated taken to recovery in satisfactory condition.

## 2015-08-07 NOTE — Discharge Instructions (Signed)
[facility] °Office Phone Number [phone] ° °BREAST BIOPSY/ PARTIAL MASTECTOMY: POST OP INSTRUCTIONS ° °Always review your discharge instruction sheet given to you by the facility where your surgery was performed. ° °IF YOU HAVE DISABILITY OR FAMILY LEAVE FORMS, YOU MUST BRING THEM TO THE OFFICE FOR PROCESSING.  DO NOT GIVE THEM TO YOUR DOCTOR. ° °1. A prescription for pain medication may be given to you upon discharge.  Take your pain medication as prescribed, if needed.  If narcotic pain medicine is not needed, then you may take acetaminophen (Tylenol) or ibuprofen (Advil) as needed. °2. Take your usually prescribed medications unless otherwise directed °3. If you need a refill on your pain medication, please contact your pharmacy.  They will contact our office to request authorization.  Prescriptions will not be filled after 5pm or on week-ends. °4. You should eat very light the first 24 hours after surgery, such as soup, crackers, pudding, etc.  Resume your normal diet the day after surgery. °5. Most patients will experience some swelling and bruising in the breast.  Ice packs and a good support bra will help.  Swelling and bruising can take several days to resolve.  °6. It is common to experience some constipation if taking pain medication after surgery.  Increasing fluid intake and taking a stool softener will usually help or prevent this problem from occurring.  A mild laxative (Milk of Magnesia or Miralax) should be taken according to package directions if there are no bowel movements after 48 hours. °7. Unless discharge instructions indicate otherwise, you may remove your bandages 24-48 hours after surgery, and you may shower at that time.  You may have steri-strips (small skin tapes) in place directly over the incision.  These strips should be left on the skin for 7-10 days.  If your surgeon used skin glue on the incision, you may shower in 24 hours.  The glue will flake off over the  next 2-3 weeks.  Any sutures or staples will be removed at the office during your follow-up visit. °8. ACTIVITIES:  You may resume regular daily activities (gradually increasing) beginning the next day.  Wearing a good support bra or sports bra minimizes pain and swelling.  You may have sexual intercourse when it is comfortable. °a. You may drive when you no longer are taking prescription pain medication, you can comfortably wear a seatbelt, and you can safely maneuver your car and apply brakes. °b. RETURN TO WORK:  ______________________________________________________________________________________ °9. You should see your doctor in the office for a follow-up appointment approximately two weeks after your surgery.  Your doctor’s nurse will typically make your follow-up appointment when she calls you with your pathology report.  Expect your pathology report 2-3 business days after your surgery.  You may call to check if you do not hear from us after three days. °10. OTHER INSTRUCTIONS: _______________________________________________________________________________________________ _____________________________________________________________________________________________________________________________________ °_____________________________________________________________________________________________________________________________________ °_____________________________________________________________________________________________________________________________________ ° °WHEN TO CALL YOUR DOCTOR: °1. Fever over 101.0 °2. Nausea and/or vomiting. °3. Extreme swelling or bruising. °4. Continued bleeding from incision. °5. Increased pain, redness, or drainage from the incision. ° °The clinic staff is available to answer your questions during regular business hours.  Please don’t hesitate to call and ask to speak to one of the nurses for clinical concerns.  If you have a medical emergency, go to the nearest  emergency room or call 911.  A surgeon from [facility] is always on call at the hospital. ° °For further questions, please visit [email or web address]  ° ° ° °  Post Anesthesia Home Care Instructions ° °Activity: °Get plenty of rest for the remainder of the day. A responsible adult should stay with you for 24 hours following the procedure.  °For the next 24 hours, DO NOT: °-Drive a car °-Operate machinery °-Drink alcoholic beverages °-Take any medication unless instructed by your physician °-Make any legal decisions or sign important papers. ° °Meals: °Start with liquid foods such as gelatin or soup. Progress to regular foods as tolerated. Avoid greasy, spicy, heavy foods. If nausea and/or vomiting occur, drink only clear liquids until the nausea and/or vomiting subsides. Call your physician if vomiting continues. ° °Special Instructions/Symptoms: °Your throat may feel dry or sore from the anesthesia or the breathing tube placed in your throat during surgery. If this causes discomfort, gargle with warm salt water. The discomfort should disappear within 24 hours. ° °If you had a scopolamine patch placed behind your ear for the management of post- operative nausea and/or vomiting: ° °1. The medication in the patch is effective for 72 hours, after which it should be removed.  Wrap patch in a tissue and discard in the trash. Wash hands thoroughly with soap and water. °2. You may remove the patch earlier than 72 hours if you experience unpleasant side effects which may include dry mouth, dizziness or visual disturbances. °3. Avoid touching the patch. Wash your hands with soap and water after contact with the patch. °  ° °

## 2015-08-07 NOTE — Progress Notes (Signed)
Assisted Dr. Lauretta Grill with right, left, ultrasound guided, pectoralis block. Side rails up, monitors on throughout procedure. See vital signs in flow sheet. Tolerated Procedure well.

## 2015-08-07 NOTE — Progress Notes (Signed)
Assisted nuc med tech #[id number] withnuc med inj. Side rails up, monitors on throughout procedure. See vital signs in flow sheet. Tolerated Procedure well. 

## 2015-08-07 NOTE — H&P (View-Only) (Signed)
Isabella Cook 07/14/2015 2:04 PM Location: [facility] Patient #: [id number] DOB: January 19, 1940 Married / Language: Isabella Cook / Race: White Female  History of Present Illness [name] [name] MD; 07/14/2015 3:42 PM) Patient words: Patient returns for follow-up of bilateral multifocal breast cancers. She was seen in the spring of this year and was placed on neoadjuvant chemotherapy for bilateral breast cancers. She has significant enhancement of her left breast in 2 areas biopsied her left breast both the upper outer quadrant 1 consistent with invasive duct carcinoma and the second with atypical ductal hyperplasia. She is an area of several centimeters of enhancement that has remained unchanged pre-and postchemotherapy MRI. She had a 7 mm right breast cancer as well and this seems to over aggressive chemotherapy. She returns today to discuss options of mastectomy versus multiple lumpectomies. The patient is a strong desire to preserve her breast. She wishes to undergo multiple lumpectomies and bilateral sentinel lymph node mapping. She understands she will have a cosmetic difference after lumpectomy specimen the left with radiation therapy. I discussed the alternative of bilateral mastectomies with possible nipple preservation reconstruction if desired. She would rather consider breast conservation surgery since his last surgery and survival outcomes of the same with mastectomy versus lumpectomy.  The patient is a 75 year old female.   Allergies [name], [place]; 07/14/2015 2:05 PM) Gelatin *Assorted Classes** any medication that is gelatin based BEEF  Medication History [name], CMA; 07/14/2015 2:05 PM) Herceptin (440MG  For Solution, Intravenous) Active. Tums 500 (1250MG  Tablet Chewable, Oral) Active. Pepcid (20MG  Tablet, Oral) Active. Claritin (10MG  Tablet, Oral) Active. Methylcellulose (Laxative) (475MG  Tablet, Oral) Active. Probiotic Acidophilus (Oral)  Active. Vitamin D (Cholecalciferol) (1000UNIT Capsule, Oral) Active. Magnesium Citrate (100MG  Tablet, Oral) Active. Estring (2MG  Ring, Vaginal) Active. Medications Reconciled    Vitals [name] CMA; 07/14/2015 2:06 PM) 07/14/2015 2:05 PM Weight: 173 lb Height: 63.5in Body Surface Area: 1.83 m Body Mass Index: 30.16 kg/m  Temp.: 98.63F  Pulse: 110 (Regular)  BP: 138/84 (Sitting, Left Arm, Standard)      Physical Exam ([name] A. Colin Norment MD; 07/14/2015 3:42 PM)  Chest and Lung Exam Chest and lung exam reveals -quiet, even and easy respiratory effort with no use of accessory muscles and on auscultation, normal breath sounds, no adventitious sounds and normal vocal resonance. Inspection Chest Wall - Normal. Back - normal.    Assessment & Plan ([name] A. [name] MD; 07/14/2015 3:44 PM)  BILATERAL BREAST CANCER (C50.911) Impression: Discuss options of breast conservation versus mastectomy. Discussed reconstruction options and she is to see plastic surgery in the next 1-2 weeks. She does have a strong desire to preserve her breast and has considered multiple lumpectomies. She would need to left lower rectum easily and one on the right. She would require bilateral sentinel lymph node mapping. She has no obvious disease on clinical examination today and normal lymph node basins. She does have significant density on the left this could represent DCIS versus fibrocystic disease. I told her that if lumpectomy fails she would require mastectomy. She understands that risk would like an attempt to preserve her breast. Risk of lumpectomy include bleeding, infection, seroma, more surgery, use of seed/wire, wound care, cosmetic deformity and the need for other treatments, death , blood clots, death. Pt agrees to proceed. Risk of sentinel lymph node mapping include bleeding, infection, lymphedema, shoulder pain. stiffness, dye allergy. arm swelling cosmetic deformity , blood clots,  death, need for more surgery. Pt agres to proceed.  Current Plans  You are being scheduled for surgery - Our schedulers will call you.  You should hear from our office's scheduling department within 5 working days about the location, date, and time of surgery. We try to make accommodations for patient's preferences in scheduling surgery, but sometimes the OR schedule or the surgeon's schedule prevents [place] from making those accommodations.  If you have not heard from our office [phone]) in 5 working days, call the office and ask for your surgeon's nurse.  If you have other questions about your diagnosis, plan, or surgery, call the office and ask for your surgeon's nurse.  We discussed the staging and pathophysiology of breast cancer. We discussed all of the different options for treatment for breast cancer including surgery, chemotherapy, radiation therapy, Herceptin, and antiestrogen therapy. We discussed a sentinel lymph node biopsy as she does not appear to having lymph node involvement right now. We discussed the performance of that with injection of radioactive tracer and blue dye. We discussed that she would have an incision underneath her axillary hairline. We discussed that there is a bout a 10-20% chance of having a positive node with a sentinel lymph node biopsy and we will await the permanent pathology to make any other first further decisions in terms of her treatment. One of these options might be to return to the operating room to perform an axillary lymph node dissection. We discussed about a 1-2% risk lifetime of chronic shoulder pain as well as lymphedema associated with a sentinel lymph node biopsy. We discussed the options for treatment of the breast cancer which included lumpectomy versus a mastectomy. We discussed the performance of the lumpectomy with a wire placement. We discussed a 10-20% chance of a positive margin requiring reexcision in the operating room. We also discussed  that she may need radiation therapy or antiestrogen therapy or both if she undergoes lumpectomy. We discussed the mastectomy and the postoperative care for that as well. We discussed that there is no difference in her survival whether she undergoes lumpectomy with radiation therapy or antiestrogen therapy versus a mastectomy. There is a slight difference in the local recurrence rate being 3-5% with lumpectomy and about 1% with a mastectomy. We discussed the risks of operation including bleeding, infection, possible reoperation. She understands her further therapy will be based on what her stages at the time of her operation.  Pt Education - [facility]b breast cancer surgery: discussed with patient and provided information. Pt Education - [facility] Breast Biopsy HCI: discussed with patient and provided information. Pt Education - ABC (After Breast Cancer) Class Info: discussed with patient and provided information.

## 2015-08-07 NOTE — Interval H&P Note (Signed)
History and Physical Interval Note:  08/07/2015 9:17 AM  Isabella Cook  has presented today for surgery, with the diagnosis of BILATERAL BREAST CANCER  The various methods of treatment have been discussed with the patient and family. After consideration of risks, benefits and other options for treatment, the patient has consented to  Procedure(s): LEFT BREAST LUMPECTOMY LOCALIZED WITH 2 RADIOACTIVE SEEDS, RIGHT SEED GUIDED LUMPECTOMY, BILATERAL SENTINEL LYMPH NODE MAPPING (Bilateral) as a surgical intervention .  The patient's history has been reviewed, patient examined, no change in status, stable for surgery.  I have reviewed the patient's chart and labs.  Questions were answered to the patient's satisfaction.     Mery Guadalupe A.

## 2015-08-07 NOTE — Anesthesia Procedure Notes (Signed)
Procedure Name: LMA Insertion Date/Time: 08/07/2015 9:41 AM Performed by: Maryella Shivers Pre-anesthesia Checklist: Patient identified, Emergency Drugs available, Suction available and Patient being monitored Patient Re-evaluated:Patient Re-evaluated prior to inductionOxygen Delivery Method: Circle system utilized Preoxygenation: Pre-oxygenation with 100% oxygen Intubation Type: IV induction Ventilation: Mask ventilation without difficulty LMA: LMA inserted LMA Size: 4.0 Number of attempts: 1 Airway Equipment and Method: Bite block Placement Confirmation: positive ETCO2 Tube secured with: Tape Dental Injury: Teeth and Oropharynx as per pre-operative assessment

## 2015-08-07 NOTE — Anesthesia Preprocedure Evaluation (Addendum)
Anesthesia Evaluation  Patient identified by MRN, date of birth, ID band Patient awake    Reviewed: Allergy & Precautions, NPO status , Patient's Chart, lab work & pertinent test results  History of Anesthesia Complications Negative for: history of anesthetic complications  Airway Mallampati: II  TM Distance: >3 FB Neck ROM: Full    Dental no notable dental hx. (+) Dental Advisory Given   Pulmonary neg pulmonary ROS,    Pulmonary exam normal breath sounds clear to auscultation       Cardiovascular negative cardio ROS Normal cardiovascular exam Rhythm:Regular Rate:Normal     Neuro/Psych negative neurological ROS  negative psych ROS   GI/Hepatic Neg liver ROS, hiatal hernia, GERD  ,  Endo/Other  Hypothyroidism   Renal/GU negative Renal ROS  negative genitourinary   Musculoskeletal  (+) Arthritis ,   Abdominal   Peds negative pediatric ROS (+)  Hematology negative hematology ROS (+)   Anesthesia Other Findings   Reproductive/Obstetrics negative OB ROS                             Anesthesia Physical Anesthesia Plan  ASA: II  Anesthesia Plan: General   Post-op Pain Management: GA combined w/ Regional for post-op pain   Induction: Intravenous  Airway Management Planned: LMA  Additional Equipment:   Intra-op Plan:   Post-operative Plan: Extubation in OR  Informed Consent: I have reviewed the patients History and Physical, chart, labs and discussed the procedure including the risks, benefits and alternatives for the proposed anesthesia with the patient or authorized representative who has indicated his/her understanding and acceptance.   Dental advisory given  Plan Discussed with: CRNA  Anesthesia Plan Comments:         Anesthesia Quick Evaluation

## 2015-08-07 NOTE — Anesthesia Postprocedure Evaluation (Signed)
Anesthesia Post Note  Patient: Isabella Cook  Procedure(s) Performed: Procedure(s) (LRB): LEFT BREAST LUMPECTOMY LOCALIZED WITH 2 RADIOACTIVE SEEDS, RIGHT BREAST SEED GUIDED LUMPECTOMY, BILATERAL SENTINEL LYMPH NODE MAPPING (Bilateral)  Patient location during evaluation: PACU Anesthesia Type: General Level of consciousness: awake and alert Pain management: pain level controlled Vital Signs Assessment: post-procedure vital signs reviewed and stable Respiratory status: spontaneous breathing, nonlabored ventilation, respiratory function stable and patient connected to nasal cannula oxygen Cardiovascular status: blood pressure returned to baseline and stable Postop Assessment: no signs of nausea or vomiting Anesthetic complications: no    Last Vitals:  Vitals:   08/07/15 1300 08/07/15 1335  BP: 137/76 (!) 150/79  Pulse: 83 90  Resp: 14 18  Temp:  36.5 C    Last Pain:  Vitals:   08/07/15 1335  TempSrc:   PainSc: 3                  Brigetta Beckstrom JENNETTE

## 2015-08-07 NOTE — Transfer of Care (Signed)
Immediate Anesthesia Transfer of Care Note  Patient: Isabella Cook  Procedure(s) Performed: Procedure(s): LEFT BREAST LUMPECTOMY LOCALIZED WITH 2 RADIOACTIVE SEEDS, RIGHT BREAST SEED GUIDED LUMPECTOMY, BILATERAL SENTINEL LYMPH NODE MAPPING (Bilateral)  Patient Location: PACU  Anesthesia Type:GA combined with regional for post-op pain  Level of Consciousness: sedated  Airway & Oxygen Therapy: Patient Spontanous Breathing and Patient connected to face mask oxygen  Post-op Assessment: Report given to RN and Post -op Vital signs reviewed and stable  Post vital signs: Reviewed and stable  Last Vitals:  Vitals:   08/07/15 0901 08/07/15 0902  BP:    Pulse: 87 85  Resp: 14 18  Temp:      Last Pain:  Vitals:   08/07/15 0809  TempSrc: Oral         Complications: No apparent anesthesia complications

## 2015-08-08 ENCOUNTER — Encounter (HOSPITAL_BASED_OUTPATIENT_CLINIC_OR_DEPARTMENT_OTHER): Payer: Self-pay | Admitting: Surgery

## 2015-08-12 ENCOUNTER — Encounter (HOSPITAL_BASED_OUTPATIENT_CLINIC_OR_DEPARTMENT_OTHER): Payer: Self-pay | Admitting: Certified Registered"

## 2015-08-12 ENCOUNTER — Ambulatory Visit: Payer: Self-pay | Admitting: Surgery

## 2015-08-14 NOTE — H&P (Signed)
Subjective:    Patient ID: Isabella Cook is a 74 y.o. female.  HPI  Returns for follow up discussion breast reconstruction. Presented following screening MMG with microcalcifications in the UOQ of the LEFT breast measuring 8 mm. Biopsy showed IDC with DCIS, ER/PR+, HER-2 +. Breast MRI showed in the left breast, an area of NME measuring 7.8 cm. Associated with this were 2 irregular masses measuring 1.7 and 1.4 cm. The masses were 5 cm anterior and superior to the biopsy clip. There was a cortically thickened left axillary lymph node noted. In the RIGHT breast there was a 7 mm irregular enhancing mass at the 6:30 o'clock position. Biopsy of additional LEFT breast upper outer quadrant showed ADH; RIGHT lower outer quadrant IDC, ER/PR +, Her2 -. Follow up US showed no abnormal LN. Completed neoadjuvant chemo and continues on Herceptin through 04/2016. Final MRI with no enhancement on right and left with unchanged area of NME. She underwent bilateral seed guided lumpectomies on left and one on right on 8.3.17. Pathology from this with RIGHT fibrocystic changes, SLN negative. LEFT LOWER with 2.5 IDC with DCIS, LEFT UPPER with 0.6 cm IDC with DCIS, focally positive margin posterior, left SLN 1/3 postive. .  Current 38 DD, but feels that bra size is inflated in order to accommodate for soft tissue lateral chest and not actual breast  From initial consult: "Reports multiple allergies- feels this started post tick bite 2006 and diagnosed with STARI. 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 Reports she has to receive benadryl with heparin flushes of this as it is pig derived. Concerned about allergy to implants or to sutures. Reports after neck surgery had swelling pain for long time and concerned she has allergy to sutures from port."     Objective:   Physical Exam  Cardiovascular: Normal rate, regular rhythm and normal heart sounds.   Pulmonary/Chest: Effort  normal and breath sounds normal.  Genitourinary: No breast discharge.  Lymphadenopathy:    She has no axillary adenopathy.   Right IJ port  Grade 2 ptosis bilateral, bilateral axillary lipomas   SN to nipple R 26 L 25 cm BW R 19 L 19 cm Nipple to IMF R 10 L 10 cm  Assessment:     Right LOQ breast cancer Left UOQ breast cancer Neoadjuvant chemotherapy S/p lumpectomy x2 left, lumpectomy right- one positive margin left    Plan:     At out consult we dicussed post mastectomy reconstruction. As the plan now is for lumpectomies, we discussed that given the size and two locations on left, she should expect she will have significant asymmetry size breasts post radiation. The 630 position of lesion on right (despite resolution on MRI final) places this side at some risk of NAC displacement. Reviewed it is hard to predict what eventual aesthetic outcome will be post radiation, but that surgery post XRT is at greater risks complications including wound healing problems. Some problems such as NAC displacement also are more difficult to correct without flap based reconstruction.  We discussed oncoplastic reduction/breast lift as method to fill in lumpectomy cavities with soft tissue to prevent areas of depression, overall more symmetric shape. Reviewed mastopexy with anchor type scars, possible overnight hospital stay or drains, post operative visits and limitations, recovery. Diminished sensation nipple and breast skin, risk of nipple loss, wound healing problems, asymmetry, incidental carcinoma, changes with wt gain/loss, aging, unacceptable cosmetic appearance reviewed.  Plan reexcision left breast margin   with Dr. Cornett and concurrent  bilateral oncoplastic mastopexy reconstruction.   Brinda Thimmappa, MD MBA Plastic & Re[facility] Surgery [phone], pin [phone]    

## 2015-08-15 ENCOUNTER — Ambulatory Visit (HOSPITAL_BASED_OUTPATIENT_CLINIC_OR_DEPARTMENT_OTHER): Admission: RE | Admit: 2015-08-15 | Payer: Medicare Other | Source: Ambulatory Visit | Admitting: Plastic Surgery

## 2015-08-15 ENCOUNTER — Encounter (HOSPITAL_BASED_OUTPATIENT_CLINIC_OR_DEPARTMENT_OTHER): Admission: RE | Payer: Self-pay | Source: Ambulatory Visit

## 2015-08-15 SURGERY — MASTOPEXY
Anesthesia: General | Laterality: Bilateral

## 2015-08-19 ENCOUNTER — Ambulatory Visit (HOSPITAL_BASED_OUTPATIENT_CLINIC_OR_DEPARTMENT_OTHER): Payer: Medicare Other | Admitting: Oncology

## 2015-08-19 ENCOUNTER — Ambulatory Visit (HOSPITAL_BASED_OUTPATIENT_CLINIC_OR_DEPARTMENT_OTHER): Payer: Medicare Other

## 2015-08-19 ENCOUNTER — Other Ambulatory Visit (HOSPITAL_BASED_OUTPATIENT_CLINIC_OR_DEPARTMENT_OTHER): Payer: Medicare Other

## 2015-08-19 ENCOUNTER — Telehealth: Payer: Self-pay | Admitting: Oncology

## 2015-08-19 VITALS — BP 146/96 | HR 77 | Temp 98.2°F | Resp 18 | Wt 174.0 lb

## 2015-08-19 DIAGNOSIS — C50511 Malignant neoplasm of lower-outer quadrant of right female breast: Secondary | ICD-10-CM

## 2015-08-19 DIAGNOSIS — C50412 Malignant neoplasm of upper-outer quadrant of left female breast: Secondary | ICD-10-CM

## 2015-08-19 DIAGNOSIS — M8580 Other specified disorders of bone density and structure, unspecified site: Secondary | ICD-10-CM | POA: Diagnosis not present

## 2015-08-19 DIAGNOSIS — Z5112 Encounter for antineoplastic immunotherapy: Secondary | ICD-10-CM

## 2015-08-19 LAB — COMPREHENSIVE METABOLIC PANEL
ALT: 18 U/L (ref 0–55)
ANION GAP: 7 meq/L (ref 3–11)
AST: 18 U/L (ref 5–34)
Albumin: 3.5 g/dL (ref 3.5–5.0)
Alkaline Phosphatase: 65 U/L (ref 40–150)
BILIRUBIN TOTAL: 0.31 mg/dL (ref 0.20–1.20)
BUN: 12.1 mg/dL (ref 7.0–26.0)
CHLORIDE: 109 meq/L (ref 98–109)
CO2: 26 meq/L (ref 22–29)
Calcium: 10 mg/dL (ref 8.4–10.4)
Creatinine: 1 mg/dL (ref 0.6–1.1)
EGFR: 56 mL/min/{1.73_m2} — AB (ref >90)
GLUCOSE: 96 mg/dL (ref 70–140)
POTASSIUM: 4.1 meq/L (ref 3.5–5.1)
SODIUM: 142 meq/L (ref 136–145)
Total Protein: 6.8 g/dL (ref 6.4–8.3)

## 2015-08-19 LAB — CBC WITH DIFFERENTIAL/PLATELET
BASO%: 0.6 % (ref 0.0–2.0)
BASOS ABS: 0 10*3/uL (ref 0.0–0.1)
EOS ABS: 0.5 10*3/uL (ref 0.0–0.5)
EOS%: 10.2 % — AB (ref 0.0–7.0)
HCT: 36 % (ref 34.8–46.6)
HGB: 12.1 g/dL (ref 11.6–15.9)
LYMPH%: 23.3 % (ref 14.0–49.7)
MCH: 30.5 pg (ref 25.1–34.0)
MCHC: 33.6 g/dL (ref 31.5–36.0)
MCV: 90.7 fL (ref 79.5–101.0)
MONO#: 0.8 10*3/uL (ref 0.1–0.9)
MONO%: 14.7 % — AB (ref 0.0–14.0)
NEUT#: 2.6 10*3/uL (ref 1.5–6.5)
NEUT%: 51.2 % (ref 38.4–76.8)
NRBC: 0 % (ref 0–0)
PLATELETS: 209 10*3/uL (ref 145–400)
RBC: 3.97 10*6/uL (ref 3.70–5.45)
RDW: 15.2 % — AB (ref 11.2–14.5)
WBC: 5.1 10*3/uL (ref 3.9–10.3)
lymph#: 1.2 10*3/uL (ref 0.9–3.3)

## 2015-08-19 MED ORDER — HEPARIN SOD (PORK) LOCK FLUSH 100 UNIT/ML IV SOLN
500.0000 [IU] | Freq: Once | INTRAVENOUS | Status: AC | PRN
  Administered 2015-08-19: 500 [IU]
  Filled 2015-08-19: qty 5

## 2015-08-19 MED ORDER — ACETAMINOPHEN 325 MG PO TABS
650.0000 mg | ORAL_TABLET | Freq: Once | ORAL | Status: AC
  Administered 2015-08-19: 650 mg via ORAL

## 2015-08-19 MED ORDER — ACETAMINOPHEN 325 MG PO TABS
ORAL_TABLET | ORAL | Status: AC
  Filled 2015-08-19: qty 2

## 2015-08-19 MED ORDER — SODIUM CHLORIDE 0.9 % IV SOLN
Freq: Once | INTRAVENOUS | Status: AC
  Administered 2015-08-19: 14:00:00 via INTRAVENOUS

## 2015-08-19 MED ORDER — DIPHENHYDRAMINE HCL 50 MG/ML IJ SOLN
25.0000 mg | Freq: Once | INTRAMUSCULAR | Status: AC
  Administered 2015-08-19: 25 mg via INTRAVENOUS

## 2015-08-19 MED ORDER — DIPHENHYDRAMINE HCL 50 MG/ML IJ SOLN
INTRAMUSCULAR | Status: AC
Start: 2015-08-19 — End: 2015-08-19
  Filled 2015-08-19: qty 1

## 2015-08-19 MED ORDER — SODIUM CHLORIDE 0.9% FLUSH
10.0000 mL | INTRAVENOUS | Status: DC | PRN
  Administered 2015-08-19: 10 mL
  Filled 2015-08-19: qty 10

## 2015-08-19 MED ORDER — TRASTUZUMAB CHEMO 150 MG IV SOLR
6.0000 mg/kg | Freq: Once | INTRAVENOUS | Status: AC
  Administered 2015-08-19: 483 mg via INTRAVENOUS
  Filled 2015-08-19: qty 23

## 2015-08-19 NOTE — Telephone Encounter (Signed)
appt made and avs printed °

## 2015-08-19 NOTE — Progress Notes (Signed)
[place]  [phone] [phone]   ID: Tonique Mendonca DOB: [birth year]  MR#: [id number]  JSE#:[id number]  Patient Care Team: [name], FNP as PCP - General (Family Medicine) [name], MD as Consulting Physician (Oncology) [name], MD as Consulting Physician (General Surgery) [name], NP as Nurse Practitioner (Hematology and Oncology) [name], MD as Consulting Physician (Oncology) Thea Silversmith, MD as Consulting Physician (Radiation Oncology) [name], MD as Consulting Physician (Neurosurgery) [name], MD as Referring Physician (Otolaryngology) [name], MD as Consulting Physician (Cardiology) PCP: [name], FNP OTHER MD:  CHIEF COMPLAINT: HER-2 positive breast cancer, bilateral beast cancers  CURRENT TREATMENT: Paclitaxel, trastuzumab  BREAST CANCER HISTORY:   From the original intake note:  Kao had routine screening mammography at [facility] suggesting an area of indeterminate microcalcifications in the upper outer quadrant of the left breast measuring 8 mm. She was referred for left breast stereotactic biopsy performed at the [place] 02/27/2015. This showed ([id number]) invasive ductal carcinoma, grade 2, estrogen receptor 100% positive, progesterone receptor 100% positive, both with strong staining intensity, with an MIB-1 of 20%, and HER-2 amplified with a signals ratio of 2.53, the number per cell being 2.65. 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  On 03/25/2015 the patient underwent bilateral breast MRI, which showed the breast density to be category B. this showed, in the left breast, an area of non-masslike enhancement measuring 7.8 cm. Associated with this were 2 adjacent irregular masses measuring 1.7 and 1.4 cm. The masses were 5 cm anterior and superior to the biopsy clip. There was a cortically thickened left axillary lymph node  noted.  Also in the right breast there was a 7 mm irregular enhancing mass at the 6:30 o'clock position.  Her subsequent history is as detailed below    INTERVAL HISTORY: Jerney returns today for follow-up of her HER-2/neu positive breast cancer, accompanied by her husband, [name].   Since her last visit here she underwent left lumpectomy 2 with sentinel lymph node sampling and also right lumpectomy with sentinel lymph node sampling, all of this 08/07/2015. The final pathology ([id number]) showed, on the right, only fibrocystic changes with no evidence of malignancy. Both right sided lymph nodes were negative.  On the left there were 2 masses, both invasive ductal carcinoma, both grade 1, the larger measuring 2.5 cm, the smaller smaller measuring 0.6 cm. One of 3 sentinel lymph nodes was positive, without extracapsular extension. Margins were positive. At this point she is strongly considering left mastectomy. In my review not too bad. I am at work which is pretty horrible but that aren't bolus. I enjoyed living, in remission (  She is due for trastuzumab today, which will be continued every 21 days through April of 2018  REVIEW OF SYSTEMS: Kilee is still tired from her surgery. She has a feeling in the left axilla "like a tennis ball". This is a seroma. She has also had some drainage from the right side. She has had no bleeding or fever however. She does have pain in both armpits. She has some urinary urgency. Otherwise a detailed review of systems today was stable  PAST MEDICAL HISTORY: Past Medical History:  Diagnosis Date  . Arthritis    wrists and knees  . Cancer ([place]) 03-2015   left breast  . GERD (gastroesophageal reflux disease)   . History of hiatal hernia   . History of jaundice as a child   .  Hypothyroidism   . Multiple food allergies   . Urinary tract infection     PAST SURGICAL HISTORY: Past Surgical History:  Procedure Laterality Date  . ABDOMINAL HYSTERECTOMY    .  APPENDECTOMY    . BACK SURGERY     lumbar  . BREAST LUMPECTOMY WITH RADIOACTIVE SEED AND SENTINEL LYMPH NODE BIOPSY Bilateral 08/07/2015   Procedure: LEFT BREAST LUMPECTOMY LOCALIZED WITH 2 RADIOACTIVE SEEDS, RIGHT BREAST SEED GUIDED LUMPECTOMY, BILATERAL SENTINEL LYMPH NODE MAPPING;  Surgeon: [name], MD;  Location: [facility];  Service: General;  Laterality: Bilateral;  . [place] SURGERY  2016  . CHOLECYSTECTOMY    . HERNIA REPAIR     UHR  . PORTACATH PLACEMENT Right 04/22/2015   Procedure: INSERTION PORT-A-CATH WITH [place];  Surgeon: [name], MD;  Location: [place];  Service: General;  Laterality: Right;  . TUBAL LIGATION      FAMILY HISTORY No family history on file. The patient's father had a history of pseudo-bulbar pulse 45 and died from a stroke at age 43. The patient's mother died at age 78 from complications of emphysema. The patient had no brothers, 2 sisters. There is no history of cancer in the family and specifically no history of breast or ovarian cancer.  GYNECOLOGIC HISTORY:  No LMP recorded. Patient has had a hysterectomy. Menarche age 75, first live birth age 75. She is GX P2. She underwent a hysterectomy with left salpingo-oophorectomy in 1984. She received hormone replacement for approximately 15 years, until 2005.  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 Her husband [name] is a retired Social research officer, government. Daughter [name] lives in [place] where she is a housewife, homeschooling her children. Son Vernee Baines lives in [place] and works in Chartered certified accountant. The patient has 2 grandchildren. She is not a church attender    ADVANCED DIRECTIVES: In place   HEALTH MAINTENANCE: Social History  Substance Use Topics  . Smoking status: Never Smoker  . Smokeless tobacco: Never Used  . Alcohol use Yes     Comment: occ 1 every 2-3 months      Colonoscopy:2014  PAP:  Bone density: 2017 [facility]//osteopenia   Lipid panel:  Allergies  Allergen Reactions  . Dairy Aid [Lactase] Anaphylaxis     Takes claritin every day and benadryl, if needed, to prevent anaphylaxis   . Eggs Or Egg-Derived Products Anaphylaxis    Nasal stuffiness, Takes claritin every day and benadryl, if needed, to prevent anaphylaxis  . Gelatin (Bovine) [Beef Extract] Anaphylaxis    Muscle pain,  Takes claritin every day and benadryl, if needed, to prevent anaphylaxis   . Gluten Meal Anaphylaxis and Diarrhea    Takes claritin every day and benadryl, if needed, to prevent anaphylaxis   . Lambs Quarters Anaphylaxis    ALL "mammal" MEAT per pt -- Muscle pain  Can eat chicken, fish, [place]  . Milk-Related Compounds Anaphylaxis    Nasal stuffiness Cheese (mozzarella, swiss, cheddar, cottage)  . Pork Allergy Anaphylaxis    ALL "mammal" MEAT per pt -- Muscle pain  Can eat chicken, fish, [place]  . Wheat Bran Anaphylaxis and Diarrhea     Takes claritin every day and benadryl, if needed, to prevent anaphylaxis   . Whey Anaphylaxis and Diarrhea     Takes claritin every day and benadryl, if needed, to prevent anaphylaxis   . Yeast Anaphylaxis     Takes  claritin every day and benadryl, if needed, to prevent anaphylaxis  . Soy Allergy Diarrhea  . Almond Oil Nausea And Vomiting    Current Outpatient Prescriptions  Medication Sig Dispense Refill  . calcium carbonate (TUMS - DOSED IN MG ELEMENTAL CALCIUM) 500 MG chewable tablet Chew 500 mg by mouth daily as needed for indigestion or heartburn.     . diphenhydrAMINE (SOMINEX) 25 MG tablet Take 25 mg by mouth at bedtime as needed. Reported on 05/20/2015    . famotidine (PEPCID) 20 MG tablet Take 20 mg by mouth daily.     Marland Kitchen liothyronine (CYTOMEL) 25 MCG tablet Take by mouth daily.    Marland Kitchen loratadine (CLARITIN) 10 MG tablet Take 10 mg by mouth daily.    Marland Kitchen LORazepam (ATIVAN) 0.5 MG tablet Take one tablet by  mouth at bedtime as needed for nausea or vomiting. 30 tablet 0  . Magnesium Citrate 100 MG TABS Take 200 mg by mouth daily.    . Methylcellulose, Laxative, (CITRUCEL) 500 MG TABS Take 2,000 mg by mouth 2 (two) times daily.    . naproxen sodium (ANAPROX) 220 MG tablet Take 440 mg by mouth 3 (three) times daily with meals.    . ondansetron (ZOFRAN) 4 MG tablet Take 4 mg by mouth every 8 (eight) hours as needed for nausea or vomiting.    Marland Kitchen oxyCODONE-acetaminophen (ROXICET) 5-325 MG tablet Take 1 tablet by mouth every 4 (four) hours as needed. 30 tablet 0  . Polyethyl Glycol-Propyl Glycol (SYSTANE ULTRA) 0.4-0.3 % SOLN Place 1 drop into both eyes daily as needed (dry eyes).    . Probiotic Product (ALIGN PO) Take 1 tablet by mouth daily.     . promethazine (PHENERGAN) 12.5 MG tablet Take 12.5 mg by mouth every 6 (six) hours as needed for nausea or vomiting.    Marland Kitchen UNABLE TO FIND Place 2 Squirts into the nose 4 (four) times daily. Gentamicin 0.025% Mupirocin 0.2%    . VITAMIN D-VITAMIN K PO Take 1 capsule by mouth daily. Reported on 04/15/2015     No current facility-administered medications for this visit.     OBJECTIVE: Middle-aged white woman In no acute distress Vitals:   08/19/15 1230  BP: (!) 146/96  Pulse: 77  Resp: 18  Temp: 98.2 F (36.8 C)     Body mass index is 30.58 kg/m.    ECOG FS:1 - Symptomatic but completely ambulatory  Sclerae unicteric, pupils round and equal Oropharynx clear and moist-- no thrush or other lesions No cervical or supraclavicular adenopathy Lungs no rales or rhonchi Heart regular rate and rhythm Abd soft, nontender, positive bowel sounds MSK no focal spinal tenderness, no upper extremity lymphedema Neuro: nonfocal, well oriented, appropriate affect Breasts: She is status post bilateral lumpectomies and bilateral sentinel lymph node sampling. On the right the incisions are healing nicely. I do not see erythema, swelling, or dehiscence. On the left there is a  palpable mass in the axilla measuring approximately 4 cm which is ballotable. There is no associated erythema.    LAB RESULTS:  CMP     Component Value Date/Time   NA 142 08/19/2015 1205   K 4.1 08/19/2015 1205   CO2 26 08/19/2015 1205   GLUCOSE 96 08/19/2015 1205   BUN 12.1 08/19/2015 1205   CREATININE 1.0 08/19/2015 1205   CALCIUM 10.0 08/19/2015 1205   PROT 6.8 08/19/2015 1205   ALBUMIN 3.5 08/19/2015 1205   AST 18 08/19/2015 1205   ALT 18 08/19/2015 1205  ALKPHOS 65 08/19/2015 1205   BILITOT 0.31 08/19/2015 1205    INo results found for: SPEP, UPEP  Lab Results  Component Value Date   WBC 5.1 08/19/2015   NEUTROABS 2.6 08/19/2015   HGB 12.1 08/19/2015   HCT 36.0 08/19/2015   MCV 90.7 08/19/2015   PLT 209 08/19/2015      Chemistry      Component Value Date/Time   NA 142 08/19/2015 1205   K 4.1 08/19/2015 1205   CO2 26 08/19/2015 1205   BUN 12.1 08/19/2015 1205   CREATININE 1.0 08/19/2015 1205      Component Value Date/Time   CALCIUM 10.0 08/19/2015 1205   ALKPHOS 65 08/19/2015 1205   AST 18 08/19/2015 1205   ALT 18 08/19/2015 1205   BILITOT 0.31 08/19/2015 1205       No results found for: LABCA2  No components found for: LABCA125  No results for input(s): INR in the last 168 hours.  Urinalysis No results found for: COLORURINE, APPEARANCEUR, LABSPEC, PHURINE, GLUCOSEU, HGBUR, BILIRUBINUR, KETONESUR, PROTEINUR, UROBILINOGEN, NITRITE, LEUKOCYTESUR   ELIGIBLE FOR AVAILABLE RESEARCH PROTOCOL: no  STUDIES: Nm Sentinel Node Inj-no Rpt (breast)  Result Date: 08/07/2015 CLINICAL DATA: bilateral breast cancer Sulfur colloid was injected intradermally by the nuclear medicine technologist for breast cancer sentinel node localization.   Mm Breast Surgical Specimen  Result Date: 08/07/2015 CLINICAL DATA:  Specimen radiograph EXAM: SPECIMEN RADIOGRAPH OF THE RIGHT BREAST COMPARISON:  Previous exam(s). FINDINGS: Status post excision of the right breast. The  radioactive seed and biopsy marker clip are present, completely intact, and were marked for pathology. The clip and seed appears closed the margins of the specimen, however I was told that a subsequent additional margin was obtained. IMPRESSION: Specimen radiograph of the right breast. Electronically Signed   By: Ammie Ferrier M.D.   On: 08/07/2015 11:58   Mm Breast Surgical Specimen  Result Date: 08/07/2015 CLINICAL DATA:  Specimen radiograph status post left lumpectomy. EXAM: SPECIMEN RADIOGRAPH OF THE LEFT BREAST COMPARISON:  Previous exam(s). FINDINGS: Status post excision of the left breast. The radioactive seed and biopsy marker clip are present, completely intact, and were marked for pathology. These findings were communicated with the OR at 10:30 a.m. IMPRESSION: Specimen radiograph of the left breast. Electronically Signed   By: Ammie Ferrier M.D.   On: 08/07/2015 10:32   Mm Breast Surgical Specimen  Result Date: 08/07/2015 CLINICAL DATA:  Specimen radiograph of the left breast status post left lumpectomy. EXAM: SPECIMEN RADIOGRAPH OF THE LEFT BREAST COMPARISON:  Previous exam(s). FINDINGS: Status post excision of the left breast. The radioactive seed and coil shaped biopsy marker clip with several calcifications are present, completely intact, and were marked for pathology. These findings were communicated with the OR at 10:25 a.m. IMPRESSION: Specimen radiograph of the left breast. Electronically Signed   By: Ammie Ferrier M.D.   On: 08/07/2015 10:27   Mm Lt Radioactive Seed Loc Mammo Guide  Result Date: 08/06/2015 CLINICAL DATA:  Patient presents for radioactive seed localization of 2 areas of carcinoma in the left breast, 1 marked with a dumbbell shaped clip and the other with a coil shaped clip, both in the upper outer quadrant. EXAM: MAMMOGRAPHIC GUIDED RADIOACTIVE SEED LOCALIZATION OF THE LEFT BREAST COMPARISON:  Previous exam(s). FINDINGS: Patient presents for radioactive seed  localization prior to surgical excision. I met with the patient and we discussed the procedure of seed localization including benefits and alternatives. We discussed the high likelihood of a  successful procedure. We discussed the risks of the procedure including infection, bleeding, tissue injury and further surgery. We discussed the low dose of radioactivity involved in the procedure. Informed, written consent was given. The usual time-out protocol was performed immediately prior to the procedure. Using mammographic guidance, sterile technique, 1% lidocaine and an I-125 radioactive seed, the coil shaped biopsy clip was localized using a lateral approach. The follow-up mammogram images confirm the seed in the expected location and were marked for Dr. [name] Stage. Follow-up survey of the patient confirms presence of the radioactive seed. Order number of I-125 seed:  [id number]. Total activity:  4.827 millicuries  Reference Date: 07/11/2015 Using mammographic guidance, sterile technique, 1% lidocaine and an I-125 radioactive seed, the dumbbell shaped biopsy clip was localized using a lateral approach. The follow-up mammogram images confirm the seed in the expected location and were marked for Dr. [name] Stage. Follow-up survey of the patient confirms presence of the radioactive seed. Order number of I-125 seed:  [id number]. Total activity:  2.010 millicuries  Reference Date: 07/11/2015 The patient tolerated the procedure well and was released from the [place]. She was given instructions regarding seed removal. IMPRESSION: Radioactive seed localization 2 lesions in the left breast. No apparent complications. Electronically Signed   By: Lajean Manes M.D.   On: 08/06/2015 14:04   Mm Lt Rad Seed Ea Add Lesion Loc Mammo  Result Date: 08/06/2015 CLINICAL DATA:  Patient presents for radioactive seed localization of 2 areas of carcinoma in the left breast, 1 marked with a dumbbell shaped clip and the other with a coil shaped  clip, both in the upper outer quadrant. EXAM: MAMMOGRAPHIC GUIDED RADIOACTIVE SEED LOCALIZATION OF THE LEFT BREAST COMPARISON:  Previous exam(s). FINDINGS: Patient presents for radioactive seed localization prior to surgical excision. I met with the patient and we discussed the procedure of seed localization including benefits and alternatives. We discussed the high likelihood of a successful procedure. We discussed the risks of the procedure including infection, bleeding, tissue injury and further surgery. We discussed the low dose of radioactivity involved in the procedure. Informed, written consent was given. The usual time-out protocol was performed immediately prior to the procedure. Using mammographic guidance, sterile technique, 1% lidocaine and an I-125 radioactive seed, the coil shaped biopsy clip was localized using a lateral approach. The follow-up mammogram images confirm the seed in the expected location and were marked for Dr. [name] Stage. Follow-up survey of the patient confirms presence of the radioactive seed. Order number of I-125 seed:  [id number]. Total activity:  8.325 millicuries  Reference Date: 07/11/2015 Using mammographic guidance, sterile technique, 1% lidocaine and an I-125 radioactive seed, the dumbbell shaped biopsy clip was localized using a lateral approach. The follow-up mammogram images confirm the seed in the expected location and were marked for Dr. [name] Stage. Follow-up survey of the patient confirms presence of the radioactive seed. Order number of I-125 seed:  [id number]. Total activity:  3.094 millicuries  Reference Date: 07/11/2015 The patient tolerated the procedure well and was released from the [place]. She was given instructions regarding seed removal. IMPRESSION: Radioactive seed localization 2 lesions in the left breast. No apparent complications. Electronically Signed   By: Lajean Manes M.D.   On: 08/06/2015 14:04   Mm Rt Radioactive Seed Loc Mammo Guide  Result  Date: 08/06/2015 CLINICAL DATA:  Patient presents for radioactive seed localization of a lesion in the right breast. EXAM: MAMMOGRAPHIC GUIDED RADIOACTIVE SEED LOCALIZATION OF THE RIGHT BREAST COMPARISON:  Previous exam(s). FINDINGS: Patient  presents for radioactive seed localization prior to surgical excision. I met with the patient and we discussed the procedure of seed localization including benefits and alternatives. We discussed the high likelihood of a successful procedure. We discussed the risks of the procedure including infection, bleeding, tissue injury and further surgery. We discussed the low dose of radioactivity involved in the procedure. Informed, written consent was given. The usual time-out protocol was performed immediately prior to the procedure. Using mammographic guidance, sterile technique, 1% lidocaine and an I-125 radioactive seed, the cylinder shaped biopsy clip in the right breast was localized using a medial approach. The follow-up mammogram images confirm the seed in the expected location and were marked for Dr. [name] Stage. Follow-up survey of the patient confirms presence of the radioactive seed. Order number of I-125 seed:  [id number]. Total activity:  7.867 millicuries  Reference Date: 06/26/2015 The patient tolerated the procedure well and was released from the [place]. She was given instructions regarding seed removal. IMPRESSION: Radioactive seed localization right breast. No apparent complications. Electronically Signed   By: Lajean Manes M.D.   On: 08/06/2015 14:01    ASSESSMENT: 75 y.o. [facility] woman  status post left breast upper outer quadrant biopsy 02/27/2015 for a clinical mT1-3 N1 invasive ductal carcinoma, grade 2, strongly estrogen and progesterone receptor positive, HER-2 amplified, with an MIB-1 of 20%.   (a) follow-up ultrasound of the left axilla 04/04/2015 found no abnormal lymph node to biopsy  (1) bilateral breast MRI 03/25/2015 shows, in addition to a  large area of non-masslike enhancement in the left breast, a 0.7 cm mass in the lower outer quadrant of the contralateral, right breast; biopsy of both these areas was performed 04/02/2015, showing  (a) left breast upper outer quadrant: atypical ductal hyperplasia  (b) right breast lower outer quadrant: a clinical T1b N0, stage IA invasive ductal carcinoma, estrogen and progesterone receptor positive, HER-2 not amplified, with an Mib-1 of 4%  (2)  neoadjuvant chemotherapy consisting of weekly paclitaxel 12 together with weekly trastuzumab started 04/29/2015  (a) 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  (a) echo 07/24//2017 shows an ejection fraction of 55-60%   (4) on 08/07/2015 she underwent  (a) right lumpectomy and sentinel lymph node sampling showing ypT0 ypN0 (complete pathologic response)  (b) two left lumpectomies, for ympT2 ypN1 invasive ductal carcinoma, grade 1, with positive margins.  (5) adjuvant radiation therapy to follow surgery  (6) tamoxifen to start at the completion of local treatment--not a PALLAS trial candidate as HER-2 positive  (a) the patient has been asked to discontinue Estring as of 05/05/2015, to be resumed when tamoxifen is started   (7) incidentally noted left lower lobe 0.7 cm nodule on 07/10/2015 MRI , requiring eventual follow-up  PLAN: Victoriya did moderately well with her surgery, but is having some local issues and also has a positive margin. At this point she is considering mastectomy, but has not yet made a definitive decision  From his systemic therapy point of view she is tolerating the trastuzumab well, and will need a repeat echocardiogram late October. She will continue on trastuzumab through April of next year.  I have an appointment with early  September, but she may cancel lab if appropriate. She will start radiation late September most likely. She knows to call for any problems that may develop before  her next visit here.     [name], MD   08/24/2015 10:29 AM

## 2015-08-19 NOTE — Patient Instructions (Signed)
[facility] Discharge Instructions for Patients  Today you received the following: Herceptin   To help prevent nausea and vomiting after your treatment, we encourage you to take your nausea medication as directed.   If you develop nausea and vomiting that is not controlled by your nausea medication, call the clinic.   BELOW ARE SYMPTOMS THAT SHOULD BE REPORTED IMMEDIATELY:  *FEVER GREATER THAN 100.5 F  *CHILLS WITH OR WITHOUT FEVER  NAUSEA AND VOMITING THAT IS NOT CONTROLLED WITH YOUR NAUSEA MEDICATION  *UNUSUAL SHORTNESS OF BREATH  *UNUSUAL BRUISING OR BLEEDING  TENDERNESS IN MOUTH AND THROAT WITH OR WITHOUT PRESENCE OF ULCERS  *URINARY PROBLEMS  *BOWEL PROBLEMS  UNUSUAL RASH Items with * indicate a potential emergency and should be followed up as soon as possible.  Feel free to call the clinic you have any questions or concerns. The clinic phone number is [phone].  Please show the CHEMO ALERT CARD at check-in to the Emergency Department and triage nurse.   

## 2015-08-27 ENCOUNTER — Ambulatory Visit: Payer: Medicare Other | Admitting: Radiation Oncology

## 2015-08-28 ENCOUNTER — Encounter (HOSPITAL_BASED_OUTPATIENT_CLINIC_OR_DEPARTMENT_OTHER): Payer: Self-pay | Admitting: *Deleted

## 2015-08-28 ENCOUNTER — Ambulatory Visit: Payer: Self-pay | Admitting: Surgery

## 2015-08-28 NOTE — H&P (Signed)
Isabella Cook 08/28/2015 3:15 PM Location: [facility] Patient #: [id number] DOB: [birth year] Married / Language: [name] / Race: White Female  History of Present Illness [name] A. Jyles Sontag MD; 08/28/2015 4:53 PM) Patient words: Patient returns after bilateral lumpectomies and bilateral sentinel lymph node mapping for bilateral breast cancers. She had no residual disease on the right nose were clear on the right. On the left, she had 2 tumors are removed. Should multiple close and focally positive margins. She had 1 left axillary node that was positive. That was her only sentinel node. I discussed this with Dr. Randell Patient not and he does not feel strongly about any further node dissection. She is scheduled to undergo reexcision of left breast lumpectomy site. She is also undergoing a breast reduction procedure by Dr. Iran Planas. I discussed with her and her husband the options of reexcision alone a reexcision and completion of her plastic surgery. She understands that she may have positive margins again require further surgery and/or mastectomy. The plastic procedure could complicate this required different reconstruction altogether. We discussed this at great length that she is comfortable with trying to do this with one operation understanding the limitations in this situation.                            Diagnosis 1. 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 - SEE ONCOLOGY TABLE BELOW. 2. Breast, lumpectomy, Left, upper - INVASIVE DUCTAL CARCINOMA, GRADE I/III, SPANNING 0.6 CM. - DUCTAL CARCINOMA IN SITU WITH CALCIFICATIONS, INTERMEDIATE GRADE. - INVASIVE CARCINOMA IS FOCALLY PRESENT AT  AND BROADLY LESS THAN 0.1 CM TO THE POSTERIOR MARGIN OF SPECIMEN 2 AND FOCALLY LESS THAN 0.1 CM TO THE LATERAL MARGIN OF SPECIMEN #2. - DUCTAL CARCINOMA IN SITU IS FOCALLY LESS THAN 0.1 CM TO THE ANTERIOR MARGIN OF SPECIMEN #2 AND FOCALLY 0.1 CM TO THE POSTERIOR MARGIN OF SPECIMEN #2. - SEE ONCOLOGY TABLE BELOW (INFORMATION ADDED TO ONCOLOGY TABLE OF PART 1). 3. Breast, excision, Left additional anterior margin - DUCTAL CARCINOMA IN SITU, FOCALLY LESS THAN 0.1 CM TO THE NEW ANTERIOR MARGIN 4. Lymph node, sentinel, biopsy, Left axillary - METASTATIC CARCINOMA IN 1 OF 1 LYMPH NODE (1/1). 5. Lymph node, sentinel, biopsy, Left axillary - THERE IS NO EVIDENCE OF CARCINOMA IN 1 OF 1 LYMPH NODE (0/1). 6. Lymph node, sentinel, biopsy, Left axillary - THERE IS NO EVIDENCE OF CARCINOMA IN 1 OF 1 LYMPH NODE (0/1). 7. Breast, lumpectomy, Right - FIBROCYSTIC CHANGES. - USUAL DUCTAL HYPERPLASIA. - THERE IS NO EVIDENCE OF MALIGNANCY. - SEE ONCOLOGY TABLE BELOW. 8. Breast, excision, Right additional superior margin - FIBROCYSTIC CHANGES. - THERE IS NO EVIDENCE OF MALIGNANCY - SEE COMMENT. 1 of 5 FINAL for Currier, Rudy [phone]) Diagnosis(continued) 9. Lymph node, sentinel, biopsy, Right axillary - THERE IS NO EVIDENCE OF CARCINOMA IN 1 OF 1 LYMPH NODE (0/1). 10. Lymph node, sentinel, biopsy, Right axillary - THERE IS NO EVIDENCE OF CARCINOMA IN 1 OF 1 LYMPH NODE (0/1). Microscopic Comment 1. BREAST, INVASIVE TUMOR, WITH LYMPH NODES PRESENT Specimen, including laterality and lymph node sampling (sentinel, non-sentinel): Left breast and left axillary lymph nodes Procedure: Two seed localized lumpectomies Histologic type: Ductal  Grade: I Tubule formation: 2 Nuclear pleomorphism: 2 Mitotic:1 Tumor size (gross measurements): 2.5 cm and 0.6 cm Margins: Invasive, distance to closest margin: Focally present and broadly less than 0.1 cm to the posterior margin of specimen #2 and focally less than  0.1 cm to the lateral specimen of #2 In-situ, distance to closest margin: Broadly less than 0.1 cm to the medial and posterior margin of specimen #1 and focally less than 0.1 cm to the anterior margin of specimen #3 and focally 0.1 cm to the posterior margin of specimen #2 Lymphovascular invasion: Not identified Ductal carcinoma in situ: Present Grade: Intermediate grade Extensive intraductal component: Not identified Lobular neoplasia: Not identified Tumor focality: Two foci Treatment effect: None Extent of tumor: Confined to breast parenchyma Lymph nodes: Examined: 3 Sentinel (left) 3 Total Lymph nodes with metastasis: 1 Isolated tumor cells (< 0.2 mm): 0 Micrometastasis: (> 0.2 mm and < 2.0 mm): 0 Macrometastasis: (> 2.0 mm): 1 Extracapsular extension: Not identified Breast prognostic profile:[id number] Estrogen receptor:.  The patient is a 75 year old female.   Allergies [name], [place]; 08/28/2015 3:15 PM) Gelatin *Assorted Classes** any medication that is gelatin based BEEF  Medication History [name], CMA; 08/28/2015 3:16 PM) Herceptin (440MG  For Solution, Intravenous) Active. Tums 500 (1250MG  Tablet Chewable, Oral) Active. Pepcid (20MG  Tablet, Oral) Active. Probiotic Acidophilus (Oral) Active. Vitamin D (Cholecalciferol) (1000UNIT Capsule, Oral) Active. Magnesium Citrate (100MG  Tablet, Oral) Active. Thyroid (100MG  Capsule, Oral) Active. Medications Reconciled    Vitals [name] CMA; 08/28/2015 3:17 PM) 08/28/2015 3:17 PM Weight: 174 lb Height: 63.5in Body Surface Area: 1.83 m Body Mass Index: 30.34 kg/m  Temp.: 97.1F(Temporal)  Pulse: 86 (Regular)  BP: 126/72 (Sitting, Left Arm, Standard)      Physical Exam (Chloe Miyoshi A. Eri Platten MD; 08/28/2015 4:53 PM)  Breast Note: Bilateral breast incisions clean dry and intact. She had a moderate sized seroma in her left axilla that I aspirated today with sterile technique and 60 cc  of serous fluid out. She's had some drainage from the right axillary incision from what sounds like a seroma last week. Most of this is resolved. No signs of infection. Moderate sized seroma at lumpectomy cavity site.    Assessment & Plan (Nicholas Ossa A. Cheri Ayotte MD; 08/28/2015 4:57 PM)  POST-OPERATIVE STATE [phone]) Impression: Risk of lumpectomy include bleeding, infection, seroma, more surgery, use of seed/wire, wound care, cosmetic deformity and the need for other treatments, death , blood clots, death. Pt agrees to proceed. Plan for left breast reexcision lumpectomy. After discussion with medical oncology we both agreed no further lymph node dissection warranted. We'll get postoperative radiation therapy.  I discussed with her standard of care after positive sentinel nodes as well as Z - 11 criteria. This criteria is not well studied in the setting of neoadjuvant chemotherapy so there are limitations to using this information. After discussion of axillary node dissection the pros and cons of this as well as long-term survival data and long-term morbidity, the patient is comfortable without proceeding without axillary x-ray node dissection.  Current Plans Pt Education - [employer] Free Text Education/Instructions: discussed with patient and provided information.

## 2015-08-28 NOTE — Progress Notes (Signed)
Pt given a carton of boost breeze with instructions verbal and written to drink by  6 am morning of surgery ,teach back and pt voiced undertanding

## 2015-09-01 ENCOUNTER — Ambulatory Visit: Payer: Self-pay | Admitting: Surgery

## 2015-09-02 ENCOUNTER — Ambulatory Visit (HOSPITAL_BASED_OUTPATIENT_CLINIC_OR_DEPARTMENT_OTHER): Payer: Medicare Other | Admitting: Anesthesiology

## 2015-09-02 ENCOUNTER — Encounter (HOSPITAL_BASED_OUTPATIENT_CLINIC_OR_DEPARTMENT_OTHER): Payer: Self-pay | Admitting: Anesthesiology

## 2015-09-02 ENCOUNTER — Ambulatory Visit (HOSPITAL_BASED_OUTPATIENT_CLINIC_OR_DEPARTMENT_OTHER)
Admission: RE | Admit: 2015-09-02 | Discharge: 2015-09-02 | Disposition: A | Discharge status: Home / Self Care | Payer: Medicare Other | Source: Ambulatory Visit | Attending: Surgery | Admitting: Surgery

## 2015-09-02 ENCOUNTER — Encounter (HOSPITAL_BASED_OUTPATIENT_CLINIC_OR_DEPARTMENT_OTHER)
Admission: RE | Disposition: A | Discharge status: Home / Self Care | Payer: Self-pay | Source: Ambulatory Visit | Attending: Surgery

## 2015-09-02 DIAGNOSIS — L821 Other seborrheic keratosis: Secondary | ICD-10-CM | POA: Insufficient documentation

## 2015-09-02 DIAGNOSIS — C50912 Malignant neoplasm of unspecified site of left female breast: Secondary | ICD-10-CM | POA: Diagnosis present

## 2015-09-02 DIAGNOSIS — L7634 Postprocedural seroma of skin and subcutaneous tissue following other procedure: Secondary | ICD-10-CM | POA: Diagnosis not present

## 2015-09-02 DIAGNOSIS — C50511 Malignant neoplasm of lower-outer quadrant of right female breast: Secondary | ICD-10-CM | POA: Insufficient documentation

## 2015-09-02 DIAGNOSIS — N6092 Unspecified benign mammary dysplasia of left breast: Secondary | ICD-10-CM | POA: Insufficient documentation

## 2015-09-02 DIAGNOSIS — C50412 Malignant neoplasm of upper-outer quadrant of left female breast: Secondary | ICD-10-CM | POA: Insufficient documentation

## 2015-09-02 DIAGNOSIS — Z170 Estrogen receptor positive status [ER+]: Secondary | ICD-10-CM | POA: Insufficient documentation

## 2015-09-02 DIAGNOSIS — Y838 Other surgical procedures as the cause of abnormal reaction of the patient, or of later complication, without mention of misadventure at the time of the procedure: Secondary | ICD-10-CM | POA: Insufficient documentation

## 2015-09-02 DIAGNOSIS — Z9221 Personal history of antineoplastic chemotherapy: Secondary | ICD-10-CM | POA: Diagnosis not present

## 2015-09-02 DIAGNOSIS — Z791 Long term (current) use of non-steroidal anti-inflammatories (NSAID): Secondary | ICD-10-CM | POA: Insufficient documentation

## 2015-09-02 DIAGNOSIS — Z79899 Other long term (current) drug therapy: Secondary | ICD-10-CM | POA: Insufficient documentation

## 2015-09-02 DIAGNOSIS — K219 Gastro-esophageal reflux disease without esophagitis: Secondary | ICD-10-CM | POA: Insufficient documentation

## 2015-09-02 HISTORY — PX: RE-EXCISION OF BREAST LUMPECTOMY: SHX6048

## 2015-09-02 HISTORY — PX: BREAST RECONSTRUCTION: SHX9

## 2015-09-02 HISTORY — PX: MASTOPEXY: SHX5358

## 2015-09-02 SURGERY — EXCISION, LESION, BREAST
Anesthesia: General | Site: Breast | Laterality: Left

## 2015-09-02 MED ORDER — BUPIVACAINE-EPINEPHRINE 0.25% -1:200000 IJ SOLN
INTRAMUSCULAR | Status: DC | PRN
  Administered 2015-09-02: 10 mL
  Administered 2015-09-02: 20 mL

## 2015-09-02 MED ORDER — CHLORHEXIDINE GLUCONATE CLOTH 2 % EX PADS: 6.0000 | MEDICATED_PAD | Freq: Once | CUTANEOUS | Status: DC

## 2015-09-02 MED ORDER — PROPOFOL 10 MG/ML IV BOLUS
INTRAVENOUS | Status: DC | PRN
  Administered 2015-09-02: 180 mg via INTRAVENOUS

## 2015-09-02 MED ORDER — ACETAMINOPHEN 500 MG PO TABS
1000.0000 mg | ORAL_TABLET | ORAL | Status: AC
  Administered 2015-09-02: 1000 mg via ORAL

## 2015-09-02 MED ORDER — MEPERIDINE HCL 25 MG/ML IJ SOLN: 6.2500 mg | INTRAMUSCULAR | Status: DC | PRN

## 2015-09-02 MED ORDER — BUPIVACAINE-EPINEPHRINE (PF) 0.25% -1:200000 IJ SOLN
INTRAMUSCULAR | Status: AC
  Filled 2015-09-02: qty 60

## 2015-09-02 MED ORDER — ONDANSETRON HCL 4 MG/2ML IJ SOLN
INTRAMUSCULAR | Status: AC
  Filled 2015-09-02: qty 2

## 2015-09-02 MED ORDER — OXYCODONE HCL 5 MG PO TABS
ORAL_TABLET | ORAL | Status: AC
  Filled 2015-09-02: qty 1

## 2015-09-02 MED ORDER — PROPOFOL 500 MG/50ML IV EMUL
INTRAVENOUS | Status: AC
  Filled 2015-09-02: qty 50

## 2015-09-02 MED ORDER — CEPHALEXIN 500 MG PO CAPS: 500.0000 mg | ORAL_CAPSULE | Freq: Four times a day (QID) | ORAL | 0 refills | Status: DC

## 2015-09-02 MED ORDER — ACETAMINOPHEN 500 MG PO TABS
ORAL_TABLET | ORAL | Status: AC
  Filled 2015-09-02: qty 2

## 2015-09-02 MED ORDER — CHLORHEXIDINE GLUCONATE CLOTH 2 % EX PADS
6.0000 | MEDICATED_PAD | Freq: Once | CUTANEOUS | Status: DC
Start: 2015-09-02 — End: 2015-09-02

## 2015-09-02 MED ORDER — LIDOCAINE HCL (CARDIAC) 20 MG/ML IV SOLN
INTRAVENOUS | Status: DC | PRN
  Administered 2015-09-02: 50 mg via INTRAVENOUS

## 2015-09-02 MED ORDER — CELECOXIB 400 MG PO CAPS
400.0000 mg | ORAL_CAPSULE | ORAL | Status: AC
  Administered 2015-09-02: 400 mg via ORAL

## 2015-09-02 MED ORDER — ONDANSETRON HCL 4 MG/2ML IJ SOLN
INTRAMUSCULAR | Status: DC | PRN
  Administered 2015-09-02: 4 mg via INTRAVENOUS

## 2015-09-02 MED ORDER — SUCCINYLCHOLINE CHLORIDE 20 MG/ML IJ SOLN
INTRAMUSCULAR | Status: DC | PRN
  Administered 2015-09-02: 50 mg via INTRAVENOUS

## 2015-09-02 MED ORDER — CEFAZOLIN SODIUM 10 G IJ SOLR: 3.0000 g | INTRAMUSCULAR | Status: DC

## 2015-09-02 MED ORDER — GABAPENTIN 300 MG PO CAPS
300.0000 mg | ORAL_CAPSULE | ORAL | Status: AC
  Administered 2015-09-02: 300 mg via ORAL

## 2015-09-02 MED ORDER — SCOPOLAMINE 1 MG/3DAYS TD PT72: 1.0000 | MEDICATED_PATCH | Freq: Once | TRANSDERMAL | Status: DC | PRN

## 2015-09-02 MED ORDER — PHENYLEPHRINE HCL 10 MG/ML IJ SOLN
INTRAMUSCULAR | Status: DC | PRN
  Administered 2015-09-02 (×3): 80 ug via INTRAVENOUS

## 2015-09-02 MED ORDER — METOPROLOL TARTRATE 5 MG/5ML IV SOLN
INTRAVENOUS | Status: DC | PRN
  Administered 2015-09-02: 2.5 mg via INTRAVENOUS

## 2015-09-02 MED ORDER — MIDAZOLAM HCL 2 MG/2ML IJ SOLN: 1.0000 mg | INTRAMUSCULAR | Status: DC | PRN

## 2015-09-02 MED ORDER — OXYCODONE-ACETAMINOPHEN 5-325 MG PO TABS: 1.0000 | ORAL_TABLET | ORAL | 0 refills | Status: DC | PRN

## 2015-09-02 MED ORDER — FENTANYL CITRATE (PF) 100 MCG/2ML IJ SOLN
INTRAMUSCULAR | Status: DC | PRN
  Administered 2015-09-02: 25 ug via INTRAVENOUS
  Administered 2015-09-02: 100 ug via INTRAVENOUS
  Administered 2015-09-02 (×3): 25 ug via INTRAVENOUS

## 2015-09-02 MED ORDER — LACTATED RINGERS IV SOLN
INTRAVENOUS | Status: DC
  Administered 2015-09-02 (×2): via INTRAVENOUS

## 2015-09-02 MED ORDER — 0.9 % SODIUM CHLORIDE (POUR BTL) OPTIME
TOPICAL | Status: DC | PRN
  Administered 2015-09-02: 1000 mL

## 2015-09-02 MED ORDER — METOCLOPRAMIDE HCL 5 MG/ML IJ SOLN: 10.0000 mg | Freq: Once | INTRAMUSCULAR | Status: DC | PRN

## 2015-09-02 MED ORDER — DEXAMETHASONE SODIUM PHOSPHATE 10 MG/ML IJ SOLN
INTRAMUSCULAR | Status: AC
  Filled 2015-09-02: qty 1

## 2015-09-02 MED ORDER — FENTANYL CITRATE (PF) 100 MCG/2ML IJ SOLN: 50.0000 ug | INTRAMUSCULAR | Status: DC | PRN

## 2015-09-02 MED ORDER — GLYCOPYRROLATE 0.2 MG/ML IJ SOLN: 0.2000 mg | Freq: Once | INTRAMUSCULAR | Status: DC | PRN

## 2015-09-02 MED ORDER — PHENYLEPHRINE 40 MCG/ML (10ML) SYRINGE FOR IV PUSH (FOR BLOOD PRESSURE SUPPORT)
PREFILLED_SYRINGE | INTRAVENOUS | Status: AC
  Filled 2015-09-02: qty 10

## 2015-09-02 MED ORDER — DEXTROSE 5 % IV SOLN
3.0000 g | INTRAVENOUS | Status: AC
  Administered 2015-09-02: 2 g via INTRAVENOUS

## 2015-09-02 MED ORDER — FENTANYL CITRATE (PF) 100 MCG/2ML IJ SOLN
25.0000 ug | INTRAMUSCULAR | Status: DC | PRN
  Administered 2015-09-02 (×2): 25 ug via INTRAVENOUS

## 2015-09-02 MED ORDER — FENTANYL CITRATE (PF) 100 MCG/2ML IJ SOLN
INTRAMUSCULAR | Status: AC
  Filled 2015-09-02: qty 2

## 2015-09-02 MED ORDER — OXYCODONE HCL 5 MG PO TABS
5.0000 mg | ORAL_TABLET | Freq: Once | ORAL | Status: AC
  Administered 2015-09-02: 5 mg via ORAL

## 2015-09-02 MED ORDER — LIDOCAINE 2% (20 MG/ML) 5 ML SYRINGE
INTRAMUSCULAR | Status: AC
  Filled 2015-09-02: qty 5

## 2015-09-02 MED ORDER — METOPROLOL TARTRATE 5 MG/5ML IV SOLN
INTRAVENOUS | Status: AC
  Filled 2015-09-02: qty 5

## 2015-09-02 MED ORDER — GABAPENTIN 300 MG PO CAPS
ORAL_CAPSULE | ORAL | Status: AC
  Filled 2015-09-02: qty 1

## 2015-09-02 MED ORDER — CELECOXIB 200 MG PO CAPS
ORAL_CAPSULE | ORAL | Status: AC
  Filled 2015-09-02: qty 2

## 2015-09-02 MED ORDER — DEXAMETHASONE SODIUM PHOSPHATE 4 MG/ML IJ SOLN
INTRAMUSCULAR | Status: DC | PRN
  Administered 2015-09-02: 10 mg via INTRAVENOUS

## 2015-09-02 MED ORDER — CEFAZOLIN SODIUM-DEXTROSE 2-4 GM/100ML-% IV SOLN
INTRAVENOUS | Status: AC
  Filled 2015-09-02: qty 100

## 2015-09-02 SURGICAL SUPPLY — 83 items
APPLIER CLIP 9.375 MED OPEN (MISCELLANEOUS) ×3
BAG DECANTER FOR FLEXI CONT (MISCELLANEOUS) IMPLANT
BANDAGE ACE 6X5 VEL STRL LF (GAUZE/BANDAGES/DRESSINGS) IMPLANT
BINDER BREAST LRG (GAUZE/BANDAGES/DRESSINGS) IMPLANT
BINDER BREAST MEDIUM (GAUZE/BANDAGES/DRESSINGS) IMPLANT
BINDER BREAST XLRG (GAUZE/BANDAGES/DRESSINGS) ×3 IMPLANT
BINDER BREAST XXLRG (GAUZE/BANDAGES/DRESSINGS) IMPLANT
BLADE SURG 10 STRL SS (BLADE) ×9 IMPLANT
BLADE SURG 15 STRL LF DISP TIS (BLADE) ×2 IMPLANT
BLADE SURG 15 STRL SS (BLADE) ×1
BNDG GAUZE ELAST 4 BULKY (GAUZE/BANDAGES/DRESSINGS) ×6 IMPLANT
CANISTER SUCT 1200ML W/VALVE (MISCELLANEOUS) ×3 IMPLANT
CHLORAPREP W/TINT 26ML (MISCELLANEOUS) ×6 IMPLANT
CLIP APPLIE 9.375 MED OPEN (MISCELLANEOUS) ×2 IMPLANT
COVER BACK TABLE 60X90IN (DRAPES) IMPLANT
COVER MAYO STAND STRL (DRAPES) IMPLANT
DECANTER SPIKE VIAL GLASS SM (MISCELLANEOUS) IMPLANT
DEVICE DUBIN W/COMP PLATE 8390 (MISCELLANEOUS) IMPLANT
DRAIN CHANNEL 15F RND FF W/TCR (WOUND CARE) ×6 IMPLANT
DRAIN CHANNEL 19F RND (DRAIN) IMPLANT
DRAPE LAPAROSCOPIC ABDOMINAL (DRAPES) IMPLANT
DRAPE LAPAROTOMY 100X72 PEDS (DRAPES) IMPLANT
DRAPE UTILITY XL STRL (DRAPES) IMPLANT
DRSG PAD ABDOMINAL 8X10 ST (GAUZE/BANDAGES/DRESSINGS) ×9 IMPLANT
DRSG TEGADERM 2-3/8X2-3/4 SM (GAUZE/BANDAGES/DRESSINGS) IMPLANT
ELECT BLADE 4.0 EZ CLEAN MEGAD (MISCELLANEOUS) ×3
ELECT COATED BLADE 2.86 ST (ELECTRODE) ×3 IMPLANT
ELECT REM PT RETURN 9FT ADLT (ELECTROSURGICAL) ×3
ELECTRODE BLDE 4.0 EZ CLN MEGD (MISCELLANEOUS) ×2 IMPLANT
ELECTRODE REM PT RTRN 9FT ADLT (ELECTROSURGICAL) ×2 IMPLANT
EVACUATOR SILICONE 100CC (DRAIN) ×6 IMPLANT
GAUZE SPONGE 4X4 12PLY STRL (GAUZE/BANDAGES/DRESSINGS) IMPLANT
GLOVE BIO SURGEON STRL SZ 6 (GLOVE) ×3 IMPLANT
GLOVE BIOGEL PI IND STRL 8 (GLOVE) ×2 IMPLANT
GLOVE BIOGEL PI INDICATOR 8 (GLOVE) ×1
GLOVE ECLIPSE 8.0 STRL XLNG CF (GLOVE) ×3 IMPLANT
GLOVE EXAM NITRILE EXT CUFF MD (GLOVE) ×6 IMPLANT
GLOVE SURG SS PI 7.0 STRL IVOR (GLOVE) ×9 IMPLANT
GOWN STRL REUS W/ TWL LRG LVL3 (GOWN DISPOSABLE) ×8 IMPLANT
GOWN STRL REUS W/TWL LRG LVL3 (GOWN DISPOSABLE) ×4
IV NS 500ML (IV SOLUTION)
IV NS 500ML BAXH (IV SOLUTION) IMPLANT
KIT FILL SYSTEM UNIVERSAL (SET/KITS/TRAYS/PACK) IMPLANT
KIT MARKER MARGIN INK (KITS) ×3 IMPLANT
LIQUID BAND (GAUZE/BANDAGES/DRESSINGS) ×6 IMPLANT
NDL SAFETY ECLIPSE 18X1.5 (NEEDLE) ×2 IMPLANT
NEEDLE HYPO 18GX1.5 SHARP (NEEDLE) ×1
NEEDLE HYPO 25X1 1.5 SAFETY (NEEDLE) ×3 IMPLANT
NS IRRIG 1000ML POUR BTL (IV SOLUTION) ×3 IMPLANT
PACK BASIN DAY SURGERY FS (CUSTOM PROCEDURE TRAY) ×3 IMPLANT
PACK UNIVERSAL I (CUSTOM PROCEDURE TRAY) ×3 IMPLANT
PENCIL BUTTON HOLSTER BLD 10FT (ELECTRODE) ×3 IMPLANT
PIN SAFETY STERILE (MISCELLANEOUS) ×3 IMPLANT
SHEET MEDIUM DRAPE 40X70 STRL (DRAPES) ×6 IMPLANT
SLEEVE SCD COMPRESS KNEE MED (MISCELLANEOUS) ×3 IMPLANT
SPONGE GAUZE 4X4 12PLY STER LF (GAUZE/BANDAGES/DRESSINGS) IMPLANT
SPONGE LAP 18X18 X RAY DECT (DISPOSABLE) ×6 IMPLANT
SPONGE LAP 4X18 X RAY DECT (DISPOSABLE) IMPLANT
STAPLER VISISTAT 35W (STAPLE) ×6 IMPLANT
STRIP CLOSURE SKIN 1/2X4 (GAUZE/BANDAGES/DRESSINGS) IMPLANT
SUT ETHILON 2 0 FS 18 (SUTURE) ×3 IMPLANT
SUT MNCRL AB 4-0 PS2 18 (SUTURE) ×12 IMPLANT
SUT MON AB 4-0 PC3 18 (SUTURE) IMPLANT
SUT PDS 3-0 CT2 (SUTURE)
SUT PDS AB 2-0 CT2 27 (SUTURE) ×3 IMPLANT
SUT PDS II 3-0 CT2 27 ABS (SUTURE) IMPLANT
SUT PROLENE 2 0 CT2 30 (SUTURE) IMPLANT
SUT SILK 2 0 SH (SUTURE) IMPLANT
SUT VIC AB 3-0 PS1 18 (SUTURE) ×6
SUT VIC AB 3-0 PS1 18XBRD (SUTURE) ×12 IMPLANT
SUT VIC AB 3-0 SH 27 (SUTURE) ×1
SUT VIC AB 3-0 SH 27X BRD (SUTURE) ×2 IMPLANT
SUT VICRYL 3-0 CR8 SH (SUTURE) IMPLANT
SUT VICRYL 4-0 PS2 18IN ABS (SUTURE) ×6 IMPLANT
SYR 50ML LL SCALE MARK (SYRINGE) ×3 IMPLANT
SYR BULB IRRIGATION 50ML (SYRINGE) ×3 IMPLANT
SYR CONTROL 10ML LL (SYRINGE) ×3 IMPLANT
TAPE MEASURE VINYL STERILE (MISCELLANEOUS) ×3 IMPLANT
TOWEL OR 17X24 6PK STRL BLUE (TOWEL DISPOSABLE) ×6 IMPLANT
TOWEL OR NON WOVEN STRL DISP B (DISPOSABLE) IMPLANT
TUBE CONNECTING 20X1/4 (TUBING) ×6 IMPLANT
UNDERPAD 30X30 (UNDERPADS AND DIAPERS) ×6 IMPLANT
YANKAUER SUCT BULB TIP NO VENT (SUCTIONS) ×3 IMPLANT

## 2015-09-02 NOTE — Interval H&P Note (Signed)
History and Physical Interval Note:  09/02/2015 9:06 AM  Isabella Cook  has presented today for surgery, with the diagnosis of LEFT BREAST CANCER  The various methods of treatment have been discussed with the patient and family. After consideration of risks, benefits and other options for treatment, the patient has consented to  Procedure(s): RE-EXCISION OF LEFT BREAST LUMPECTOMY (Left) BILATERAL ONCOPLASTIC RECONSTRUCTION WITH MASTOPEXY (Bilateral) MASTOPEXY (Bilateral) as a surgical intervention .  The patient's history has been reviewed, patient examined, no change in status, stable for surgery.  I have reviewed the patient's chart and labs.  Questions were answered to the patient's satisfaction.     Nanci Lakatos

## 2015-09-02 NOTE — H&P (View-Only) (Signed)
Isabella Cook 08/28/2015 3:15 PM Location: [facility] Patient #: [id number] DOB: [birth year] Married / Language: [name] / Race: White Female  History of Present Illness Isabella Cook A. [name] MD; 08/28/2015 4:53 PM) Patient words: Patient returns after bilateral lumpectomies and bilateral sentinel lymph node mapping for bilateral breast cancers. She had no residual disease on the right nose were clear on the right. On the left, she had 2 tumors are removed. Should multiple close and focally positive margins. She had 1 left axillary node that was positive. That was her only sentinel node. I discussed this with Dr. Randell Patient not and he does not feel strongly about any further node dissection. She is scheduled to undergo reexcision of left breast lumpectomy site. She is also undergoing a breast reduction procedure by Dr. Iran Planas. I discussed with her and her husband the options of reexcision alone a reexcision and completion of her plastic surgery. She understands that she may have positive margins again require further surgery and/or mastectomy. The plastic procedure could complicate this required different reconstruction altogether. We discussed this at great length that she is comfortable with trying to do this with one operation understanding the limitations in this situation.                            Diagnosis 1. 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 - SEE ONCOLOGY TABLE BELOW. 2. Breast, lumpectomy, Left, upper - INVASIVE DUCTAL CARCINOMA, GRADE I/III, SPANNING 0.6 CM. - DUCTAL CARCINOMA IN SITU WITH CALCIFICATIONS, INTERMEDIATE GRADE. - INVASIVE CARCINOMA IS FOCALLY PRESENT AT  AND BROADLY LESS THAN 0.1 CM TO THE POSTERIOR MARGIN OF SPECIMEN 2 AND FOCALLY LESS THAN 0.1 CM TO THE LATERAL MARGIN OF SPECIMEN #2. - DUCTAL CARCINOMA IN SITU IS FOCALLY LESS THAN 0.1 CM TO THE ANTERIOR MARGIN OF SPECIMEN #2 AND FOCALLY 0.1 CM TO THE POSTERIOR MARGIN OF SPECIMEN #2. - SEE ONCOLOGY TABLE BELOW (INFORMATION ADDED TO ONCOLOGY TABLE OF PART 1). 3. Breast, excision, Left additional anterior margin - DUCTAL CARCINOMA IN SITU, FOCALLY LESS THAN 0.1 CM TO THE NEW ANTERIOR MARGIN 4. Lymph node, sentinel, biopsy, Left axillary - METASTATIC CARCINOMA IN 1 OF 1 LYMPH NODE (1/1). 5. Lymph node, sentinel, biopsy, Left axillary - THERE IS NO EVIDENCE OF CARCINOMA IN 1 OF 1 LYMPH NODE (0/1). 6. Lymph node, sentinel, biopsy, Left axillary - THERE IS NO EVIDENCE OF CARCINOMA IN 1 OF 1 LYMPH NODE (0/1). 7. Breast, lumpectomy, Right - FIBROCYSTIC CHANGES. - USUAL DUCTAL HYPERPLASIA. - THERE IS NO EVIDENCE OF MALIGNANCY. - SEE ONCOLOGY TABLE BELOW. 8. Breast, excision, Right additional superior margin - FIBROCYSTIC CHANGES. - THERE IS NO EVIDENCE OF MALIGNANCY - SEE COMMENT. 1 of 5 FINAL for [name], [name] [phone]) Diagnosis(continued) 9. Lymph node, sentinel, biopsy, Right axillary - THERE IS NO EVIDENCE OF CARCINOMA IN 1 OF 1 LYMPH NODE (0/1). 10. Lymph node, sentinel, biopsy, Right axillary - THERE IS NO EVIDENCE OF CARCINOMA IN 1 OF 1 LYMPH NODE (0/1). Microscopic Comment 1. BREAST, INVASIVE TUMOR, WITH LYMPH NODES PRESENT Specimen, including laterality and lymph node sampling (sentinel, non-sentinel): Left breast and left axillary lymph nodes Procedure: Two seed localized lumpectomies Histologic type: Ductal  Grade: I Tubule formation: 2 Nuclear pleomorphism: 2 Mitotic:1 Tumor size (gross measurements): 2.5 cm and 0.6 cm Margins: Invasive, distance to closest margin: Focally present and broadly less than 0.1 cm to the posterior margin of specimen #2 and focally less than  0.1 cm to the lateral specimen of #2 In-situ, distance to closest margin: Broadly less than 0.1 cm to the medial and posterior margin of specimen #1 and focally less than 0.1 cm to the anterior margin of specimen #3 and focally 0.1 cm to the posterior margin of specimen #2 Lymphovascular invasion: Not identified Ductal carcinoma in situ: Present Grade: Intermediate grade Extensive intraductal component: Not identified Lobular neoplasia: Not identified Tumor focality: Two foci Treatment effect: None Extent of tumor: Confined to breast parenchyma Lymph nodes: Examined: 3 Sentinel (left) 3 Total Lymph nodes with metastasis: 1 Isolated tumor cells (< 0.2 mm): 0 Micrometastasis: (> 0.2 mm and < 2.0 mm): 0 Macrometastasis: (> 2.0 mm): 1 Extracapsular extension: Not identified Breast prognostic profile:[id number] Estrogen receptor:.  The patient is a 75 year old female.   Allergies [name], [place]; 08/28/2015 3:15 PM) Gelatin *Assorted Classes** any medication that is gelatin based BEEF  Medication History [name], CMA; 08/28/2015 3:16 PM) Herceptin (440MG  For Solution, Intravenous) Active. Tums 500 (1250MG  Tablet Chewable, Oral) Active. Pepcid (20MG  Tablet, Oral) Active. Probiotic Acidophilus (Oral) Active. Vitamin D (Cholecalciferol) (1000UNIT Capsule, Oral) Active. Magnesium Citrate (100MG  Tablet, Oral) Active. Thyroid (100MG  Capsule, Oral) Active. Medications Reconciled    Vitals [name] CMA; 08/28/2015 3:17 PM) 08/28/2015 3:17 PM Weight: 174 lb Height: 63.5in Body Surface Area: 1.83 m Body Mass Index: 30.34 kg/m  Temp.: 97.37F(Temporal)  Pulse: 86 (Regular)  BP: 126/72 (Sitting, Left Arm, Standard)      Physical Exam (Codie Hainer A. Mysty Kielty MD; 08/28/2015 4:53 PM)  Breast Note: Bilateral breast incisions clean dry and intact. She had a moderate sized seroma in her left axilla that I aspirated today with sterile technique and 60 cc  of serous fluid out. She's had some drainage from the right axillary incision from what sounds like a seroma last week. Most of this is resolved. No signs of infection. Moderate sized seroma at lumpectomy cavity site.    Assessment & Plan (Jeydan Barner A. Deliana Avalos MD; 08/28/2015 4:57 PM)  POST-OPERATIVE STATE [phone]) Impression: Risk of lumpectomy include bleeding, infection, seroma, more surgery, use of seed/wire, wound care, cosmetic deformity and the need for other treatments, death , blood clots, death. Pt agrees to proceed. Plan for left breast reexcision lumpectomy. After discussion with medical oncology we both agreed no further lymph node dissection warranted. We'll get postoperative radiation therapy.  I discussed with her standard of care after positive sentinel nodes as well as Z - 11 criteria. This criteria is not well studied in the setting of neoadjuvant chemotherapy so there are limitations to using this information. After discussion of axillary node dissection the pros and cons of this as well as long-term survival data and long-term morbidity, the patient is comfortable without proceeding without axillary x-ray node dissection.  Current Plans Pt Education - [employer] Free Text Education/Instructions: discussed with patient and provided information.

## 2015-09-02 NOTE — Transfer of Care (Signed)
Immediate Anesthesia Transfer of Care Note  Patient: Yesica Chelette  Procedure(s) Performed: Procedure(s): RE-EXCISION OF LEFT BREAST LUMPECTOMY (Left) BILATERAL ONCOPLASTIC RECONSTRUCTION WITH MASTOPEXY (Bilateral) MASTOPEXY (Bilateral)  Patient Location: PACU  Anesthesia Type:General  Level of Consciousness: awake and patient cooperative  Airway & Oxygen Therapy: Patient Spontanous Breathing and Patient connected to face mask oxygen  Post-op Assessment: Report given to RN and Post -op Vital signs reviewed and stable  Post vital signs: Reviewed and stable  Last Vitals:  Vitals:   09/02/15 0833 09/02/15 1311  BP: (!) 141/72 (!) 160/63  Pulse: 91 96  Resp: 20 14  Temp: 36.8 C     Last Pain:  Vitals:   09/02/15 0833  TempSrc: Oral         Complications: No apparent anesthesia complications

## 2015-09-02 NOTE — Anesthesia Postprocedure Evaluation (Signed)
Anesthesia Post Note  Patient: Isabella Cook  Procedure(s) Performed: Procedure(s) (LRB): RE-EXCISION OF LEFT BREAST LUMPECTOMY (Left) BILATERAL ONCOPLASTIC RECONSTRUCTION WITH MASTOPEXY (Bilateral) MASTOPEXY (Bilateral)  Patient location during evaluation: PACU Anesthesia Type: General Level of consciousness: awake and alert Pain management: pain level controlled Vital Signs Assessment: post-procedure vital signs reviewed and stable Respiratory status: spontaneous breathing, nonlabored ventilation, respiratory function stable and patient connected to nasal cannula oxygen Cardiovascular status: blood pressure returned to baseline and stable Postop Assessment: no signs of nausea or vomiting Anesthetic complications: no    Last Vitals:  Vitals:   09/02/15 1412 09/02/15 1415  BP:  122/77  Pulse: 77 77  Resp: 11 16  Temp:  37.1 C    Last Pain:  Vitals:   09/02/15 1515  TempSrc:   PainSc: 4                  Montez Hageman

## 2015-09-02 NOTE — Anesthesia Procedure Notes (Signed)
Procedure Name: Intubation Date/Time: 09/02/2015 9:57 AM Performed by: Marrianne Mood Pre-anesthesia Checklist: Patient identified, Emergency Drugs available, Suction available, Patient being monitored and Timeout performed Patient Re-evaluated:Patient Re-evaluated prior to inductionOxygen Delivery Method: Circle system utilized Preoxygenation: Pre-oxygenation with 100% oxygen Intubation Type: IV induction Ventilation: Mask ventilation without difficulty Laryngoscope Size: Miller and 3 Grade View: Grade III Tube type: Oral Number of attempts: 1 Airway Equipment and Method: Stylet and Oral airway Placement Confirmation: ETT inserted through vocal cords under direct vision,  positive ETCO2 and breath sounds checked- equal and bilateral Secured at: 20 cm Tube secured with: Tape Dental Injury: Teeth and Oropharynx as per pre-operative assessment

## 2015-09-02 NOTE — Interval H&P Note (Signed)
History and Physical Interval Note:  09/02/2015 9:29 AM  Isabella Cook  has presented today for surgery, with the diagnosis of LEFT BREAST CANCER  The various methods of treatment have been discussed with the patient and family. After consideration of risks, benefits and other options for treatment, the patient has consented to  Procedure(s): RE-EXCISION OF LEFT BREAST LUMPECTOMY (Left) BILATERAL ONCOPLASTIC RECONSTRUCTION WITH MASTOPEXY (Bilateral) MASTOPEXY (Bilateral) as a surgical intervention .  The patient's history has been reviewed, patient examined, no change in status, stable for surgery.  I have reviewed the patient's chart and labs.  Questions were answered to the patient's satisfaction.     Dayra Rapley A.

## 2015-09-02 NOTE — Op Note (Signed)
Operative Note   DATE OF OPERATION: 8.29.17  LOCATION: [facility]-outpatient  SURGICAL DIVISION: Plastic Surgery  PREOPERATIVE DIAGNOSES:  1. Right breast cancer lower outer quadrant 2. Left breast cancer upper outer quadrant 3. Neoadjuvant chemotherapy  POSTOPERATIVE DIAGNOSES:  same  PROCEDURE:  1. Bilateral oncoplastic reconstruction with mastopexy 2. Drainage right axillary seroma, simple  SURGEON: [name] MD MBA  ASSISTANT: none  ANESTHESIA:  General.   EBL: 50 ml for entire case  COMPLICATIONS: None immediate.   INDICATIONS FOR PROCEDURE:  The patient, Isabella Cook, is a 75 y.o. female born on Nov 16, 1940, is here for oncoplastic reconstruction. She has undergone bilateral lumpectomies with sentinel node sampling. She will undergo reexcision on left for clearance of margins with Dr. [name] Stage.   FINDINGS: Bilateral axillary seromas, on right spontaneously draining. Left lumpectomies over 56 g resected initially, additional 29 g resected with margins today. Left mastopexy 62 g. Right lumpectomy over 21 g resected initially, additional 140 g resected today.As part of mastopexy on right, anterior surface of lumpectomy cavity at 630 position was included in resection and was sent separately.  DESCRIPTION OF PROCEDURE:  The patient was marked standing in the preoperative area to mark sternal notch, chest midline, anterior axillary lines, inframammary folds. The location of new nipple areolar complex was marked at level of on iframammary fold on anterior surface breast by palpation. This was marked symmetric over bilateral breasts. With aid of Wise pattern marker, location of new nipple areolar complex and vertical limbs (7 cm) were marked. The patient was taken to the operating room. SCDs were placed and IV antibiotics were given. The patient's operative site was prepped and draped in a sterile fashion. A time out was performed and all information was confirmed to be  correct.   Please see Dr. [name] Stage note for description of reexcision left breast and aspiration left axillary seroma. Over left breast, superomedial pedicle marked and nipple areolar complex marked with 45 mm diameter marker. Pedicle deepithlialized and developed  5-6 cm thickness and dissected toward chest wall until tension free rotation of pedicle achieved. Lumpectomy cavity noted occupying lateral breast both upper outer and lower outer quadrants. Largely skin only over inferior pole resected and inferiorly based flap breast tissue advanced into lumpectomy defect and secured to chest wall with interrupted 2-0 PDS. Breast tailor tacked closed.  I then directed attention to right breast where superomedial pedicle designed. The pedicle was deepithelialized and developed to chest wall. Inferior pole skin and breast tissue excised. This included the anterior surface of lumpectomy cavity and this was sent as separate specimen. Skin and soft tissue flaps developed until able to be redraped over pedicle without tension. Patient brought to upright sitting position and assessed for symmetry.  Patent returned to supine position and breast cavities irrigated and hemostasis obtained. 15 Fr JP placed in each breast and secured with 2-0 nylon. Closure completed with 3-0 vicryl to approximate dermis along inframammary fold and vertical limns. NAC inset with 4-0 vicryl in dermis.Skin closure completed with 4-0 monocryl subcuticular throughout.Tissue adhesive applied.  Dry dressing and breast binder applied. The patient was allowed to wake from anesthesia, extubated and taken to the recovery room in satisfactory condition.  SPECIMENS: 1. Left mastopexy 2. Right mastopexy 3. Right mastopexy-anterior lumpectomy cavity  DRAINS: 15 Fr JP in right and left breast  [name], MD [facility] Plastic & Reconstructive Surgery [phone], pin [phone]

## 2015-09-02 NOTE — Discharge Instructions (Signed)
Post Anesthesia Home Care Instructions  Activity: Get plenty of rest for the remainder of the day. A responsible adult should stay with you for 24 hours following the procedure.  For the next 24 hours, DO NOT: -Drive a car -Paediatric nurse -Drink alcoholic beverages -Take any medication unless instructed by your physician -Make any legal decisions or sign important papers.  Meals: Start with liquid foods such as gelatin or soup. Progress to regular foods as tolerated. Avoid greasy, spicy, heavy foods. If nausea and/or vomiting occur, drink only clear liquids until the nausea and/or vomiting subsides. Call your physician if vomiting continues.  Special Instructions/Symptoms: Your throat may feel dry or sore from the anesthesia or the breathing tube placed in your throat during surgery. If this causes discomfort, gargle with warm salt water. The discomfort should disappear within 24 hours.  If you had a scopolamine patch placed behind your ear for the management of post- operative nausea and/or vomiting:  1. The medication in the patch is effective for 72 hours, after which it should be removed.  Wrap patch in a tissue and discard in the trash. Wash hands thoroughly with soap and water. 2. You may remove the patch earlier than 72 hours if you experience unpleasant side effects which may include dry mouth, dizziness or visual disturbances. 3. Avoid touching the patch. Wash your hands with soap and water after contact with the patch.    About my Jackson-Pratt Bulb Drain  What is a Jackson-Pratt bulb? A Jackson-Pratt is a soft, round device used to collect drainage. It is connected to a long, thin drainage catheter, which is held in place by one or two small stiches near your surgical incision site. When the bulb is squeezed, it forms a vacuum, forcing the drainage to empty into the bulb.  Emptying the Jackson-Pratt bulb- To empty the bulb: 1. Release the plug on the top of the  bulb. 2. Pour the bulb's contents into a measuring container which your nurse will provide. 3. Record the time emptied and amount of drainage. Empty the drain(s) as often as your     doctor or nurse recommends.    Squeezing the Jackson-Pratt Bulb To squeeze the bulb: 1. Make sure the plug at the top of the bulb is open. 2. Squeeze the bulb tightly in your fist. You will hear air squeezing from the bulb. 3. Replace the plug while the bulb is squeezed. 4. Use a safety pin to attach the bulb to your clothing. This will keep the catheter from     pulling at the bulb insertion site.  When to call your doctor- Call your doctor if:  Drain site becomes red, swollen or hot.  You have a fever greater than 101 degrees F.  There is oozing at the drain site.  Drain falls out (apply a guaze bandage over the drain hole and secure it with tape).  Drainage increases daily not related to activity patterns. (You will usually have more drainage when you are active than when you are resting.)  Drainage has a bad odor.   JP Drain [employer] this sheet to all of your post-operative appointments while you have your drains.  Please measure your drains by CC's or ML's.  Make sure you drain and measure your JP Drains 2 or 3 times per day.  At the end of each day, add up totals for the left side and add up totals for the right side.    ( 9 am )     (  3 pm )        ( 9 pm )                Date L  R  L  R  L  R  Total L/R

## 2015-09-02 NOTE — Anesthesia Preprocedure Evaluation (Signed)
Anesthesia Evaluation  Patient identified by MRN, date of birth, ID band Patient awake    Reviewed: Allergy & Precautions, NPO status , Patient's Chart, lab work & pertinent test results  Airway Mallampati: II  TM Distance: >3 FB Neck ROM: Full    Dental no notable dental hx.    Pulmonary neg pulmonary ROS,    Pulmonary exam normal breath sounds clear to auscultation       Cardiovascular negative cardio ROS Normal cardiovascular exam Rhythm:Regular Rate:Normal     Neuro/Psych negative neurological ROS  negative psych ROS   GI/Hepatic Neg liver ROS, GERD  Medicated and Controlled,  Endo/Other  Hypothyroidism   Renal/GU negative Renal ROS  negative genitourinary   Musculoskeletal negative musculoskeletal ROS (+)   Abdominal   Peds negative pediatric ROS (+)  Hematology negative hematology ROS (+)   Anesthesia Other Findings   Reproductive/Obstetrics negative OB ROS                             Anesthesia Physical Anesthesia Plan  ASA: II  Anesthesia Plan: General   Post-op Pain Management:    Induction: Intravenous  Airway Management Planned: Oral ETT  Additional Equipment:   Intra-op Plan:   Post-operative Plan: Extubation in OR  Informed Consent: I have reviewed the patients History and Physical, chart, labs and discussed the procedure including the risks, benefits and alternatives for the proposed anesthesia with the patient or authorized representative who has indicated his/her understanding and acceptance.   Dental advisory given  Plan Discussed with: CRNA  Anesthesia Plan Comments:         Anesthesia Quick Evaluation

## 2015-09-02 NOTE — Brief Op Note (Signed)
09/02/2015  10:39 AM  PATIENT:  Glynis Smiles  75 y.o. female  PRE-OPERATIVE DIAGNOSIS:  LEFT BREAST CANCER  POST-OPERATIVE DIAGNOSIS:  Same  PROCEDURE:  Procedure(s): RE-EXCISION OF LEFT BREAST LUMPECTOMY (Left) BILATERAL ONCOPLASTIC RECONSTRUCTION WITH MASTOPEXY (Bilateral) MASTOPEXY (Bilateral)  SURGEON:  Surgeon(s) and Role: Panel 1:    * [name], MD - Primary  Panel 2:    * [name], MD - Primary      ANESTHESIA:   general  EBL:  Total I/O In: 1000 [I.V.:1000] Out: 200 [Urine:200]       SPECIMEN:  Source of Specimen:  left breast margins  DISPOSITION OF SPECIMEN:  PATHOLOGY  COUNTS:  YES  TOURNIQUET:  * No tourniquets in log *  DICTATION: .Other Dictation: Dictation Number [id number]  PLAN OF CARE: Discharge to home after PACU  PATIENT DISPOSITION:  PACU - hemodynamically stable.   Delay start of Pharmacological VTE agent (>24hrs) due to surgical blood loss or risk of bleeding: not applicable

## 2015-09-03 ENCOUNTER — Encounter (HOSPITAL_BASED_OUTPATIENT_CLINIC_OR_DEPARTMENT_OTHER): Payer: Self-pay | Admitting: Surgery

## 2015-09-03 NOTE — Op Note (Signed)
[name], [name] NO.:  [id number]  MEDICAL RECORD NO.:  [id number]  LOCATION:                                 FACILITY:  PHYSICIAN:  [name] A. Charnae Lill, M.D.DATE OF BIRTH:  26-Dec-1940  DATE OF PROCEDURE:  09/02/2015 DATE OF DISCHARGE:                              OPERATIVE REPORT   PREOPERATIVE DIAGNOSIS:  Left breast cancer.  POSTOPERATIVE DIAGNOSIS:  Left breast cancer.  PROCEDURE:  Left breast re-excision lumpectomy.  SURGEON:  [name] A. Solon Alban, M.D.  ANESTHESIA:  General anesthesia.  EBL:  Minimal.  SPECIMEN:  Lumpectomy cavity margins oriented with ink, sent to pathology.  DRAINS:  None.  INDICATIONS FOR PROCEDURE:  The patient is a 75 year old female, who is undergoing breast conserving surgery for left breast cancer.  She underwent bilateral lumpectomies after neoadjuvant chemotherapy.  She is also getting bilateral breast reduction and bilateral breast lift by Plastic Surgery.  Her initial lumpectomy showed positive margins involving the deep, lateral, and anterior margins.  Recommend re- excision today with ongoing plastic surgery reconstruction.  We discussed doing this separately or in conjunction with Plastic Surgery. I reviewed the risk of doing it together versus doing it separately. There is a possibility she would require further surgery, even after reconstruction, which could compromise reconstruction.  She is aware of all risk, but wished to do everything at the same time.  Risk of bleeding, infection, seroma formation, cosmetic deformity, the need for mastectomy, and the need for other operative procedures discussed. Death, DVT, and other operative risks discussed.  She agreed to proceed.  DESCRIPTION OF PROCEDURE:  The patient was met in the holding area and questions were answered.  Left breast was marked as the correct site. She was taken back to the operating room and placed under general anesthesia after being put  supine on the OR table.  After bilateral prep and drape of both breasts, time-out was done.  She had a seroma in her left axilla from the lumpectomy and sentinel lymph node procedure, and I drained this with an 18-gauge needle and got out about 60 mL of serous fluid, which was just above the lumpectomy cavity.  I then opened the incision and evacuated the remainder of the fluid from her left breast incision.  All margins were excised and oriented.  Clips were placed.  Hemostasis was achieved.  At this point in time, Plastic Surgery took over the case to complete the reconstruction.  All final counts were found to be correct.  She was stable at this point during the case.     Shandricka Monroy A. Breelle Hollywood, M.D.     TAC/MEDQ  D:  09/02/2015  T:  09/03/2015  Job:  [id number]

## 2015-09-09 ENCOUNTER — Ambulatory Visit (HOSPITAL_BASED_OUTPATIENT_CLINIC_OR_DEPARTMENT_OTHER): Payer: Medicare Other

## 2015-09-09 ENCOUNTER — Other Ambulatory Visit (HOSPITAL_BASED_OUTPATIENT_CLINIC_OR_DEPARTMENT_OTHER): Payer: Medicare Other

## 2015-09-09 ENCOUNTER — Other Ambulatory Visit: Payer: Self-pay | Admitting: Oncology

## 2015-09-09 ENCOUNTER — Telehealth (HOSPITAL_COMMUNITY): Payer: Self-pay | Admitting: Vascular Surgery

## 2015-09-09 VITALS — BP 137/75 | HR 77 | Temp 98.7°F | Resp 16

## 2015-09-09 DIAGNOSIS — C50412 Malignant neoplasm of upper-outer quadrant of left female breast: Secondary | ICD-10-CM | POA: Diagnosis present

## 2015-09-09 DIAGNOSIS — Z5112 Encounter for antineoplastic immunotherapy: Secondary | ICD-10-CM | POA: Diagnosis present

## 2015-09-09 DIAGNOSIS — C50511 Malignant neoplasm of lower-outer quadrant of right female breast: Secondary | ICD-10-CM

## 2015-09-09 LAB — COMPREHENSIVE METABOLIC PANEL
ALBUMIN: 3.3 g/dL — AB (ref 3.5–5.0)
ALK PHOS: 77 U/L (ref 40–150)
ALT: 12 U/L (ref 0–55)
ANION GAP: 9 meq/L (ref 3–11)
AST: 12 U/L (ref 5–34)
BILIRUBIN TOTAL: 0.38 mg/dL (ref 0.20–1.20)
BUN: 13.3 mg/dL (ref 7.0–26.0)
CALCIUM: 10.2 mg/dL (ref 8.4–10.4)
CO2: 27 mEq/L (ref 22–29)
Chloride: 106 mEq/L (ref 98–109)
Creatinine: 1 mg/dL (ref 0.6–1.1)
EGFR: 55 mL/min/{1.73_m2} — AB (ref >90)
Glucose: 101 mg/dl (ref 70–140)
POTASSIUM: 4.1 meq/L (ref 3.5–5.1)
SODIUM: 142 meq/L (ref 136–145)
Total Protein: 7.4 g/dL (ref 6.4–8.3)

## 2015-09-09 LAB — CBC WITH DIFFERENTIAL/PLATELET
BASO%: 0.7 % (ref 0.0–2.0)
BASOS ABS: 0.1 10*3/uL (ref 0.0–0.1)
EOS ABS: 0.5 10*3/uL (ref 0.0–0.5)
EOS%: 6.5 % (ref 0.0–7.0)
HEMATOCRIT: 36.7 % (ref 34.8–46.6)
HEMOGLOBIN: 12.2 g/dL (ref 11.6–15.9)
LYMPH%: 21.6 % (ref 14.0–49.7)
MCH: 30 pg (ref 25.1–34.0)
MCHC: 33.2 g/dL (ref 31.5–36.0)
MCV: 90.4 fL (ref 79.5–101.0)
MONO#: 0.9 10*3/uL (ref 0.1–0.9)
MONO%: 12.7 % (ref 0.0–14.0)
NEUT#: 4.3 10*3/uL (ref 1.5–6.5)
NEUT%: 58.5 % (ref 38.4–76.8)
PLATELETS: 269 10*3/uL (ref 145–400)
RBC: 4.06 10*6/uL (ref 3.70–5.45)
RDW: 15 % — AB (ref 11.2–14.5)
WBC: 7.4 10*3/uL (ref 3.9–10.3)
lymph#: 1.6 10*3/uL (ref 0.9–3.3)

## 2015-09-09 MED ORDER — DIPHENHYDRAMINE HCL 25 MG PO CAPS
ORAL_CAPSULE | ORAL | Status: AC
  Filled 2015-09-09: qty 1

## 2015-09-09 MED ORDER — DIPHENHYDRAMINE HCL 50 MG/ML IJ SOLN
25.0000 mg | Freq: Once | INTRAMUSCULAR | Status: AC
Start: 2015-09-09 — End: 2015-09-09
  Administered 2015-09-09: 25 mg via INTRAVENOUS

## 2015-09-09 MED ORDER — SODIUM CHLORIDE 0.9% FLUSH
10.0000 mL | INTRAVENOUS | Status: DC | PRN
Start: 2015-09-09 — End: 2015-09-09
  Administered 2015-09-09: 10 mL
  Filled 2015-09-09: qty 10

## 2015-09-09 MED ORDER — ACETAMINOPHEN 325 MG PO TABS
ORAL_TABLET | ORAL | Status: AC
  Filled 2015-09-09: qty 2

## 2015-09-09 MED ORDER — DIPHENHYDRAMINE HCL 50 MG/ML IJ SOLN
INTRAMUSCULAR | Status: AC
  Filled 2015-09-09: qty 1

## 2015-09-09 MED ORDER — ACETAMINOPHEN 325 MG PO TABS
650.0000 mg | ORAL_TABLET | Freq: Once | ORAL | Status: AC
  Administered 2015-09-09: 650 mg via ORAL

## 2015-09-09 MED ORDER — TRASTUZUMAB CHEMO 150 MG IV SOLR
6.0000 mg/kg | Freq: Once | INTRAVENOUS | Status: AC
  Administered 2015-09-09: 483 mg via INTRAVENOUS
  Filled 2015-09-09: qty 23

## 2015-09-09 MED ORDER — SODIUM CHLORIDE 0.9 % IV SOLN
Freq: Once | INTRAVENOUS | Status: AC
  Administered 2015-09-09: 11:00:00 via INTRAVENOUS

## 2015-09-09 MED ORDER — HEPARIN SOD (PORK) LOCK FLUSH 100 UNIT/ML IV SOLN
500.0000 [IU] | Freq: Once | INTRAVENOUS | Status: AC | PRN
  Administered 2015-09-09: 500 [IU]
  Filled 2015-09-09: qty 5

## 2015-09-09 NOTE — Patient Instructions (Signed)
[facility] Discharge Instructions for Patients  Today you received the following: Herceptin   To help prevent nausea and vomiting after your treatment, we encourage you to take your nausea medication as directed.   If you develop nausea and vomiting that is not controlled by your nausea medication, call the clinic.   BELOW ARE SYMPTOMS THAT SHOULD BE REPORTED IMMEDIATELY:  *FEVER GREATER THAN 100.5 F  *CHILLS WITH OR WITHOUT FEVER  NAUSEA AND VOMITING THAT IS NOT CONTROLLED WITH YOUR NAUSEA MEDICATION  *UNUSUAL SHORTNESS OF BREATH  *UNUSUAL BRUISING OR BLEEDING  TENDERNESS IN MOUTH AND THROAT WITH OR WITHOUT PRESENCE OF ULCERS  *URINARY PROBLEMS  *BOWEL PROBLEMS  UNUSUAL RASH Items with * indicate a potential emergency and should be followed up as soon as possible.  Feel free to call the clinic you have any questions or concerns. The clinic phone number is [phone].  Please show the CHEMO ALERT CARD at check-in to the Emergency Department and triage nurse.   

## 2015-09-09 NOTE — Telephone Encounter (Signed)
Left pt message to make appt in Oct w/ DB w/ ECHO

## 2015-09-10 ENCOUNTER — Other Ambulatory Visit: Payer: Self-pay | Admitting: Oncology

## 2015-09-23 NOTE — Progress Notes (Signed)
Location of Breast Cancer: Left Breast, Right Breast  Histology per Pathology Report:  09/02/15 Diagnosis 1. Breast, excision, Left anterior margin - BENIGN BREAST PARENCHYMA WITH CALCIFICATIONS. - THERE IS NO EVIDENCE OF MALIGNANCY. - SEE COMMENT. 2. Breast, excision, left medial margin - DUCTAL CARCINOMA IN SITU, INTERMEDIATE GRADE. - THE SURGICAL RESECTION MARGINS ARE NEGATIVE FOR CARCINOMA. 3. Breast, excision, left superior margin - DUCTAL CARCINOMA IN SITU, FOCALLY LESS THAN 0.1 CM TO THE NEW MARGIN. 4. Breast, excision, left posterior margin - ATYPICAL DUCTAL HYPERPLASIA. - SEE COMMENT. 5. Breast, excision, left lateral margin - BENIGN BREAST PARENCHYMA. - THERE IS NO EVIDENCE OF MALIGNANCY. - SEE COMMENT. 6. Breast, excision, left inferior margin - BENIGN BREAST PARENCHYMA WITH CALCIFICATIONS. - THERE IS NO EVIDENCE OF MALIGNANCY. - SEE COMMENT. 7. Breast, Mammoplasty, left - BENIGN SKIN. - THERE IS NO EVIDENCE OF MALIGNANCY. - SEE COMMENT. 8. Breast, excision, right anterior - BENIGN SKIN WITH SEBORRHEIC KERATOSIS. BENIGN BREAST PARENCHYMA WITH HEALING BIOPSY SITE. - THERE IS NO EVIDENCE OF MALIGNANCY. - SEE COMMENT. 9. Breast, Mammoplasty, right - BENIGN BREAST PARENCHYMA. - THERE IS NO EVIDENCE OF MALIGNANCY.  04/02/15 Diagnosis 1. Breast, left, needle core biopsy, upper outer anterior - ATYPICAL DUCTAL HYPERPLASIA. - SEE COMMENT. 2. Breast, right, needle core biopsy, lower outer - INVASIVE MAMMARY CARCINOMA. - MAMMARY CARCINOMA IN SITU.  Receptor Status: ER(100%), PR (80%), Her2-neu (NEG), Ki-(4%)  02/27/15 Diagnosis Breast, left, needle core biopsy, UOQ INVASIVE DUCTAL CARCINOMA WITH ASSOCIATED CALCIFICATIONS, GRADE 2 DUTAL CARCINOMA IN SITU IS PRESENT  Receptor Status: ER (100%), PR (100%), Her2-neu (POS), Ki- (20%)  Did patient present with symptoms or was this found on screening mammography?: It was found on a screening mammogram.    Past/Anticipated interventions by surgeon, if any: 08/07/15 Procedure: Left breast seed localized partial mastectomy 2 with right breast seed localized partial mastectomy and bilateral sentinel lymph node mapping with methylene blue dye Surgeon: [name] M.D.  09/02/15  PROCEDURE:  Procedure(s): RE-EXCISION OF LEFT BREAST LUMPECTOMY (Left) BILATERAL ONCOPLASTIC RECONSTRUCTION WITH MASTOPEXY (Bilateral) MASTOPEXY (Bilateral) SURGEON:  Surgeon(s) and Role: Panel 1:    * [name], MD - Primary Panel 2:    * [name], MD - Primary   Past/Anticipated interventions by medical oncology, if any:  Dr. 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             (a) echo 07/24//2017 shows an ejection fraction of 55-60%     Tamoxifen to start at the completion of local treatment--not a PALLAS trial candidate as HER-2 positive             (a) the patient has been asked to discontinue Estring as of 05/05/2015, to be resumed when tamoxifen is started   Lymphedema issues, if any:  She has some swelling to her Left axilla area and a smaller area to her Right axilla. She has good arm movement in her Left arm  Pain issues, if any:  No  SAFETY ISSUES:  Prior radiation? No  Pacemaker/ICD? No  Possible current pregnancy? N/A  Is the patient on  methotrexate? No  Current Complaints / other details:    BP (!) 148/83   Pulse 78   Temp 98.2 F (36.8 C)   Ht 5' 3.5" (1.613 m)   Wt 173 lb 9.6 oz (78.7 kg)   SpO2 98% Comment: room air  BMI 30.27 kg/m    Wt Readings from Last 3 Encounters:  09/29/15 173 lb 9.6 oz  (78.7 kg)  09/02/15 173 lb (78.5 kg)  08/19/15 174 lb (78.9 kg)      Isabella Cook, Isabella Police, RN 09/23/2015,2:43 PM

## 2015-09-24 ENCOUNTER — Ambulatory Visit: Payer: Medicare Other

## 2015-09-24 ENCOUNTER — Ambulatory Visit: Payer: Medicare Other | Admitting: Radiation Oncology

## 2015-09-29 ENCOUNTER — Ambulatory Visit
Admission: RE | Admit: 2015-09-29 | Discharge: 2015-09-29 | Disposition: A | Discharge status: Home / Self Care | Payer: Medicare Other | Source: Ambulatory Visit | Attending: Radiation Oncology | Admitting: Radiation Oncology

## 2015-09-29 ENCOUNTER — Encounter: Payer: Self-pay | Admitting: Radiation Oncology

## 2015-09-29 DIAGNOSIS — Z510 Encounter for antineoplastic radiation therapy: Secondary | ICD-10-CM | POA: Diagnosis not present

## 2015-09-29 DIAGNOSIS — C50511 Malignant neoplasm of lower-outer quadrant of right female breast: Secondary | ICD-10-CM

## 2015-09-29 DIAGNOSIS — C50412 Malignant neoplasm of upper-outer quadrant of left female breast: Secondary | ICD-10-CM

## 2015-09-29 DIAGNOSIS — Z170 Estrogen receptor positive status [ER+]: Secondary | ICD-10-CM | POA: Insufficient documentation

## 2015-09-29 NOTE — Progress Notes (Signed)
  Radiation Oncology         [phone] ________________________________  Name: Isabella Cook MRN: [id number]  Date: 09/29/2015  DOB: 02-19-1940  SIMULATION AND TREATMENT PLANNING NOTE    Outpatient  DIAGNOSIS:     ICD-9-CM ICD-10-CM   1. Breast cancer of lower-outer quadrant of right female breast (HCC) 174.5 C50.511   2. Breast cancer of upper-outer quadrant of left female breast ([place]) 174.4 C50.412     NARRATIVE:  The patient was brought to the [facility].  Identity was confirmed.  All relevant records and images related to the planned course of therapy were reviewed.  The patient freely provided informed written consent to proceed with treatment after reviewing the details related to the planned course of therapy. The consent form was witnessed and verified by the simulation staff.    Then, the patient was set-up in a stable reproducible supine position for radiation therapy with her ipsilateral arm over her head, and her upper body secured in a custom-made Vac-lok device.  CT images were obtained.  Surface markings were placed.  The CT images were loaded into the planning software.    TREATMENT PLANNING NOTE: Treatment planning then occurred.  The radiation prescription was entered and confirmed.     A total of 7 medically necessary complex treatment devices were fabricated and supervised by me: 6 fields with MLCs for custom blocks to protect heart, and lungs;  and, a Vac-lok. MORE COMPLEX DEVICES MAY BE MADE IN DOSIMETRY FOR FIELD IN FIELD BEAMS FOR DOSE HOMOGENEITY.  I have requested : 3D Simulation  I have requested a DVH of the following structures: lungs, heart, lumpectomy cavities, esophagus, cord.    The patient will receive 50 Gy in 25 fractions to the bilateral breasts with 4 fields ; two more fields will treat the regional nodes on the left side.  This will be followed by a boost to the left breast.  Optical Surface Tracking Plan:  Since intensity modulated  radiotherapy (IMRT) and 3D conformal radiation treatment methods are predicated on accurate and precise positioning for treatment, intrafraction motion monitoring is medically necessary to ensure accurate and safe treatment delivery. The ability to quantify intrafraction motion without excessive ionizing radiation dose can only be performed with optical surface tracking. Accordingly, surface imaging offers the opportunity to obtain 3D measurements of patient position throughout IMRT and 3D treatments without excessive radiation exposure. I am ordering optical surface tracking for this patient's upcoming course of radiotherapy.  ________________________________   Reference:  Ursula Alert, J, et al. Surface imaging-based analysis of intrafraction motion for breast radiotherapy patients.Journal of [place], n. 6, nov. 2014. ISSN [id number].  Available at: <http://www.jacmp.org/index.php/jacmp/article/view/4957>.    -----------------------------------  Eppie Gibson, MD

## 2015-09-29 NOTE — Progress Notes (Signed)
Radiation Oncology         [phone] ________________________________  Name: Isabella Cook MRN: [id number]  Date: 09/29/2015  DOB: [birth year]  Follow-Up Visit Note  Outpatient  CC: [name], FNP  [name], MD  Diagnosis:      ICD-9-CM ICD-10-CM   1. Breast cancer of lower-outer quadrant of right female breast (HCC) 174.5 C50.511   2. 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     Since consultation with Dr. Pablo Ledger, she underwent neoadjuvant chemotherapy with Dr [name], followed by bilateral lumpectomies and sentinel lymph node biopsies on 08-07-15.  This revealed multicentric tumors in the left upper and lower breast - largest tumor at 2.5cm.  1/3 SLN positive.  Right Breast revealed no residual tumor and the SLN were negative.  Re-excision for positive margins was done on 8-29, effectively clearing the left breast margins.  She is also status post BILATERAL ONCOPLASTIC RECONSTRUCTION WITH MASTOPEXY (09-02-15).    Past/Anticipated interventions by medical oncology, if any:  Quoted from Dr. 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             (a) echo 07/24//2017 shows an ejection fraction of 55-60%     Tamoxifen to start at the completion of local treatment--not a PALLAS trial candidate as HER-2 positive             (a) the patient has been asked to discontinue Estring as of 05/05/2015, to be resumed when tamoxifen is started     Lymphedema issues, if any:  She has some swelling to her Left axilla area and a smaller area to her Right axilla. She has good arm movement in her Left arm  Pain issues, if any:  No  SAFETY ISSUES:  Prior radiation? No  Pacemaker/ICD? No  Possible current pregnancy? N/A  Is the patient on methotrexate? No  Current Complaints / other details: N/A; She lives near [facility] and wishes to have treatment here in [place]:  is allergic to dairy aid [lactase]; eggs or egg-derived products; gelatin (bovine) [beef extract]; gluten meal; lambs quarters; milk-related compounds; pork allergy; wheat bran; whey; yeast; soy allergy; and almond oil.  Meds: Current Outpatient Prescriptions  Medication Sig Dispense Refill  . calcium carbonate (TUMS - DOSED IN MG ELEMENTAL CALCIUM) 500 MG chewable tablet Chew 500 mg by mouth daily as needed for indigestion or heartburn.     . diphenhydrAMINE (SOMINEX) 25 MG tablet Take 25 mg by mouth at bedtime as needed. Reported on 05/20/2015    . famotidine (PEPCID) 20 MG tablet Take 20 mg by mouth daily.     Marland Kitchen liothyronine (CYTOMEL) 5 MCG tablet Take 5 mcg by mouth daily.    Marland Kitchen loratadine (CLARITIN) 10 MG tablet Take 10 mg by mouth daily.    . Magnesium Citrate 100 MG TABS Take 200  mg by mouth daily.    . Methylcellulose, Laxative, (CITRUCEL) 500 MG TABS Take 2,000 mg by mouth 2 (two) times daily.    . naproxen sodium (ANAPROX) 220 MG tablet Take 440 mg by mouth 3 (three) times daily with meals.    Vladimir Faster Glycol-Propyl Glycol (SYSTANE ULTRA) 0.4-0.3 % SOLN Place 1 drop into both eyes daily as needed (dry eyes).    . Probiotic Product (ALIGN PO) Take 1 tablet by mouth daily.     Marland Kitchen VITAMIN D-VITAMIN K PO Take 1 capsule by mouth daily. Reported on 04/15/2015    . ondansetron (ZOFRAN) 4 MG tablet Take 4 mg by mouth every 8 (eight) hours as needed for nausea or vomiting.    Marland Kitchen oxyCODONE-acetaminophen (ROXICET) 5-325 MG tablet Take 1-2 tablets by  mouth every 4 (four) hours as needed. (Patient not taking: Reported on 09/29/2015) 20 tablet 0  . promethazine (PHENERGAN) 12.5 MG tablet Take 12.5 mg by mouth every 6 (six) hours as needed for nausea or vomiting.     No current facility-administered medications for this encounter.     Physical Findings:  height is 5' 3.5" (1.613 m) and weight is 173 lb 9.6 oz (78.7 kg). Her temperature is 98.2 F (36.8 C). Her blood pressure is 148/83 (abnormal) and her pulse is 78. Her oxygen saturation is 98%. .     General: Alert and oriented, in no acute distress HEENT: Head is normocephalic. Extraocular movements are intact. Oropharynx is clear. Neck: Neck is supple, no palpable cervical or supraclavicular lymphadenopathy. Heart: Regular in rate and rhythm with no murmurs Chest: Clear to auscultation bilaterally, with no rhonchi, wheezes, or rales. Abdomen: Soft, nontender, nondistended, with no rigidity or guarding. Extremities: No cyanosis or edema. Lymphatics: see Neck Exam Musculoskeletal: symmetric strength and muscle tone throughout. Good ROM in arms Neurologic: No obvious focalities. Speech is fluent.  Psychiatric: Judgment and insight are intact. Affect is appropriate. Breast exam reveals healed scars over bilateral breasts/axillae   Lab Findings: Lab Results  Component Value Date   WBC 7.4 09/09/2015   HGB 12.2 09/09/2015   HCT 36.7 09/09/2015   MCV 90.4 09/09/2015   PLT 269 09/09/2015       Radiographic Findings: No results found.  Impression/Plan: Bilateral breast cancer We discussed adjuvant radiotherapy today.  I recommend radiotherapy to the left breast and regional nodes, as well as the right breast, in order to reduce risk of locoregional recurrence by 2/3. Anticipate 5-6 wks of treatment.  The risks, benefits and side effects of this treatment were discussed in detail.  She understands that radiotherapy is associated with skin irritation and fatigue in the acute setting.  Late effects can include cosmetic changes and rare injury to internal organs.   She is enthusiastic about proceeding with treatment. A consent form has been signed and placed in her chart. Simulation will occur today. _____________________________________   Eppie Gibson, MD

## 2015-09-30 ENCOUNTER — Ambulatory Visit: Payer: Medicare Other

## 2015-09-30 ENCOUNTER — Other Ambulatory Visit (HOSPITAL_BASED_OUTPATIENT_CLINIC_OR_DEPARTMENT_OTHER): Payer: Medicare Other

## 2015-09-30 ENCOUNTER — Ambulatory Visit (HOSPITAL_BASED_OUTPATIENT_CLINIC_OR_DEPARTMENT_OTHER): Payer: Medicare Other

## 2015-09-30 ENCOUNTER — Ambulatory Visit (HOSPITAL_BASED_OUTPATIENT_CLINIC_OR_DEPARTMENT_OTHER): Payer: Medicare Other | Admitting: Oncology

## 2015-09-30 VITALS — BP 138/87 | HR 79 | Temp 97.9°F | Resp 18 | Ht 63.5 in | Wt 170.4 lb

## 2015-09-30 DIAGNOSIS — M8580 Other specified disorders of bone density and structure, unspecified site: Secondary | ICD-10-CM

## 2015-09-30 DIAGNOSIS — C50511 Malignant neoplasm of lower-outer quadrant of right female breast: Secondary | ICD-10-CM | POA: Diagnosis not present

## 2015-09-30 DIAGNOSIS — Z5112 Encounter for antineoplastic immunotherapy: Secondary | ICD-10-CM | POA: Diagnosis present

## 2015-09-30 DIAGNOSIS — R911 Solitary pulmonary nodule: Secondary | ICD-10-CM

## 2015-09-30 DIAGNOSIS — Z95828 Presence of other vascular implants and grafts: Secondary | ICD-10-CM | POA: Insufficient documentation

## 2015-09-30 DIAGNOSIS — C50412 Malignant neoplasm of upper-outer quadrant of left female breast: Secondary | ICD-10-CM

## 2015-09-30 DIAGNOSIS — N952 Postmenopausal atrophic vaginitis: Secondary | ICD-10-CM | POA: Diagnosis not present

## 2015-09-30 LAB — COMPREHENSIVE METABOLIC PANEL
ALBUMIN: 3.6 g/dL (ref 3.5–5.0)
ALK PHOS: 68 U/L (ref 40–150)
ALT: 20 U/L (ref 0–55)
AST: 18 U/L (ref 5–34)
Anion Gap: 10 mEq/L (ref 3–11)
BUN: 10.8 mg/dL (ref 7.0–26.0)
CALCIUM: 10.2 mg/dL (ref 8.4–10.4)
CHLORIDE: 107 meq/L (ref 98–109)
CO2: 25 mEq/L (ref 22–29)
CREATININE: 1 mg/dL (ref 0.6–1.1)
EGFR: 53 mL/min/{1.73_m2} — ABNORMAL LOW (ref >90)
Glucose: 96 mg/dl (ref 70–140)
Potassium: 4 mEq/L (ref 3.5–5.1)
SODIUM: 142 meq/L (ref 136–145)
Total Bilirubin: 0.33 mg/dL (ref 0.20–1.20)
Total Protein: 7.4 g/dL (ref 6.4–8.3)

## 2015-09-30 LAB — CBC WITH DIFFERENTIAL/PLATELET
BASO%: 2 % (ref 0.0–2.0)
Basophils Absolute: 0.1 10*3/uL (ref 0.0–0.1)
EOS%: 7.2 % — AB (ref 0.0–7.0)
Eosinophils Absolute: 0.4 10*3/uL (ref 0.0–0.5)
HEMATOCRIT: 38.1 % (ref 34.8–46.6)
HEMOGLOBIN: 12.7 g/dL (ref 11.6–15.9)
LYMPH#: 1.6 10*3/uL (ref 0.9–3.3)
LYMPH%: 29.4 % (ref 14.0–49.7)
MCH: 29.8 pg (ref 25.1–34.0)
MCHC: 33.2 g/dL (ref 31.5–36.0)
MCV: 89.6 fL (ref 79.5–101.0)
MONO#: 0.6 10*3/uL (ref 0.1–0.9)
MONO%: 11.7 % (ref 0.0–14.0)
NEUT%: 49.7 % (ref 38.4–76.8)
NEUTROS ABS: 2.7 10*3/uL (ref 1.5–6.5)
Platelets: 187 10*3/uL (ref 145–400)
RBC: 4.25 10*6/uL (ref 3.70–5.45)
RDW: 15.1 % — AB (ref 11.2–14.5)
WBC: 5.4 10*3/uL (ref 3.9–10.3)

## 2015-09-30 MED ORDER — DIPHENHYDRAMINE HCL 50 MG/ML IJ SOLN
25.0000 mg | Freq: Once | INTRAMUSCULAR | Status: AC
  Administered 2015-09-30: 25 mg via INTRAVENOUS

## 2015-09-30 MED ORDER — ACETAMINOPHEN 325 MG PO TABS
650.0000 mg | ORAL_TABLET | Freq: Once | ORAL | Status: AC
  Administered 2015-09-30: 650 mg via ORAL

## 2015-09-30 MED ORDER — HEPARIN SOD (PORK) LOCK FLUSH 100 UNIT/ML IV SOLN
500.0000 [IU] | Freq: Once | INTRAVENOUS | Status: AC | PRN
  Administered 2015-09-30: 500 [IU]
  Filled 2015-09-30: qty 5

## 2015-09-30 MED ORDER — ACETAMINOPHEN 325 MG PO TABS
ORAL_TABLET | ORAL | Status: AC
  Filled 2015-09-30: qty 2

## 2015-09-30 MED ORDER — DIPHENHYDRAMINE HCL 50 MG/ML IJ SOLN
INTRAMUSCULAR | Status: AC
  Filled 2015-09-30: qty 1

## 2015-09-30 MED ORDER — SODIUM CHLORIDE 0.9% FLUSH
10.0000 mL | INTRAVENOUS | Status: DC | PRN
  Administered 2015-09-30: 10 mL
  Filled 2015-09-30: qty 10

## 2015-09-30 MED ORDER — TRASTUZUMAB CHEMO 150 MG IV SOLR
6.0000 mg/kg | Freq: Once | INTRAVENOUS | Status: AC
  Administered 2015-09-30: 483 mg via INTRAVENOUS
  Filled 2015-09-30: qty 15.93

## 2015-09-30 MED ORDER — SODIUM CHLORIDE 0.9 % IJ SOLN
10.0000 mL | INTRAMUSCULAR | Status: DC | PRN
Start: 2015-09-30 — End: 2015-09-30
  Filled 2015-09-30: qty 10

## 2015-09-30 MED ORDER — SODIUM CHLORIDE 0.9 % IV SOLN
Freq: Once | INTRAVENOUS | Status: AC
  Administered 2015-09-30: 14:00:00 via INTRAVENOUS

## 2015-09-30 NOTE — Progress Notes (Signed)
Pt wanted labs drawn peripherally  

## 2015-09-30 NOTE — Progress Notes (Signed)
[place]  [phone] [phone]   ID: Isabella Cook DOB: 07-30-1940  MR#: [id number]  OFB#:[id number]  Patient Care Team: [name], FNP as PCP - General (Family Medicine) [name], MD as Consulting Physician (Oncology) [name], MD as Consulting Physician (General Surgery) [name], NP as Nurse Practitioner (Hematology and Oncology) [name], MD as Consulting Physician (Oncology) Thea Silversmith, MD as Consulting Physician (Radiation Oncology) [name], MD as Consulting Physician (Neurosurgery) [name], MD as Referring Physician (Otolaryngology) [name], MD as Consulting Physician (Cardiology) PCP: [name], FNP OTHER MD:  CHIEF COMPLAINT: HER-2 positive breast cancer, bilateral beast cancers  CURRENT TREATMENT: Paclitaxel, trastuzumab  BREAST CANCER HISTORY:   From the original intake note:  Isabella Cook had routine screening mammography at [facility] suggesting an area of indeterminate microcalcifications in the upper outer quadrant of the left breast measuring 8 mm. She was referred for left breast stereotactic biopsy performed at [facility] 02/27/2015. This showed ([id number]) invasive ductal carcinoma, grade 2, estrogen receptor 100% positive, progesterone receptor 100% positive, both with strong staining intensity, with an MIB-1 of 20%, and HER-2 amplified with a signals ratio of 2.53, the number per cell being 2.65. 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  On 03/25/2015 the patient underwent bilateral breast MRI, which showed the breast density to be category B. this showed, in the left breast, an area of non-masslike enhancement measuring 7.8 cm. Associated with this were 2 adjacent irregular masses measuring 1.7 and 1.4 cm. The masses were 5 cm anterior and superior to the biopsy clip. There was a cortically thickened left axillary lymph node  noted.  Also in the right breast there was a 7 mm irregular enhancing mass at the 6:30 o'clock position.  Her subsequent history is as detailed below    INTERVAL HISTORY: Isabella Cook returns today for follow-up of her breast cancers, accompanied by her husband, Isabella Cook. Since her last visit here she underwent further surgery to the left breast for margin clearance. This was done 09/02/2015. The final pathology ([id number]) showed all margins clear, the closest being the left superior margin for ductal carcinoma in situ focally less than 0.1 cm). She has met with radiation oncology and is scheduled for simulation 10/06/2015.  She is due for trastuzumab today. This will be continued every 21 days through April of 2018. She continues to tolerate it well, her most recent echo in July showing a well-preserved ejection fraction  REVIEW OF SYSTEMS: Isabella Cook did very well with her surgery, with no problems with pain, bleeding, rash, swelling, or dehiscence. She feels a result is very symmetric and cosmetically very good. She does have problems with night sweats. She has a little bit of a runny nose and some ringing in her years. She has had 2 more urinary tract infections treated with antibiotics and she still occasionally has pain with urination. A detailed review of systems today was otherwise stable  PAST MEDICAL HISTORY: Past Medical History:  Diagnosis Date  . Arthritis    wrists and knees  . Cancer ([place]) 03-2015   left breast  . GERD (gastroesophageal reflux disease)   . History of hiatal hernia   . History of jaundice as a child   . Hypothyroidism   . Multiple food allergies   . Urinary tract infection     PAST SURGICAL HISTORY: Past Surgical History:  Procedure Laterality Date  . ABDOMINAL HYSTERECTOMY    . APPENDECTOMY    .  BACK SURGERY     lumbar  . BREAST LUMPECTOMY WITH RADIOACTIVE SEED AND SENTINEL LYMPH NODE BIOPSY Bilateral 08/07/2015   Procedure: LEFT BREAST LUMPECTOMY LOCALIZED WITH 2  RADIOACTIVE SEEDS, RIGHT BREAST SEED GUIDED LUMPECTOMY, BILATERAL SENTINEL LYMPH NODE MAPPING;  Surgeon: [name], MD;  Location: [place];  Service: General;  Laterality: Bilateral;  . BREAST RECONSTRUCTION Bilateral 09/02/2015   Procedure: BILATERAL ONCOPLASTIC RECONSTRUCTION WITH MASTOPEXY;  Surgeon: [name], MD;  Location: [place];  Service: Plastics;  Laterality: Bilateral;  . [place] SURGERY  2016  . CHOLECYSTECTOMY    . HERNIA REPAIR     UHR  . MASTOPEXY Bilateral 09/02/2015   Procedure: MASTOPEXY;  Surgeon: [name], MD;  Location: [place];  Service: Plastics;  Laterality: Bilateral;  . PORTACATH PLACEMENT Right 04/22/2015   Procedure: INSERTION PORT-A-CATH WITH [place];  Surgeon: [name], MD;  Location: [place];  Service: General;  Laterality: Right;  . RE-EXCISION OF BREAST LUMPECTOMY Left 09/02/2015   Procedure: RE-EXCISION OF LEFT BREAST LUMPECTOMY;  Surgeon: [name], MD;  Location: [place];  Service: General;  Laterality: Left;  . TUBAL LIGATION      FAMILY HISTORY No family history on file. The patient's father had a history of pseudo-bulbar pulse 10 and died from a stroke at age 19. The patient's mother died at age 20 from complications of emphysema. The patient had no brothers, 2 sisters. There is no history of cancer in the family and specifically no history of breast or ovarian cancer.  GYNECOLOGIC HISTORY:  No LMP recorded. Patient has had a hysterectomy. Menarche age 35, first live birth age 62. She is GX P2. She underwent a hysterectomy with left salpingo-oophorectomy in 1984. She received hormone replacement for approximately 15 years, until 2005.  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 Her husband Isabella Cook is a retired Social research officer, government. Daughter Isabella Cook lives in [place] where  she is a housewife, homeschooling her children. Son Isabella Cook lives in [place] and works in Chartered certified accountant. The patient has 2 grandchildren. She is not a church attender    ADVANCED DIRECTIVES: In place   HEALTH MAINTENANCE: Social History  Substance Use Topics  . Smoking status: Never Smoker  . Smokeless tobacco: Never Used  . Alcohol use Yes     Comment: occ 1 every 2-3 months     Colonoscopy:2014  PAP:  Bone density: 2017 [facility]//osteopenia   Lipid panel:  Allergies  Allergen Reactions  . Dairy Aid [Lactase] Anaphylaxis     Takes claritin every day and benadryl, if needed, to prevent anaphylaxis   . Eggs Or Egg-Derived Products Anaphylaxis    Nasal stuffiness, Takes claritin every day and benadryl, if needed, to prevent anaphylaxis  . Gelatin (Bovine) [Beef Extract] Anaphylaxis    Muscle pain,  Takes claritin every day and benadryl, if needed, to prevent anaphylaxis   . Gluten Meal Anaphylaxis and Diarrhea    Takes claritin every day and benadryl, if needed, to prevent anaphylaxis   . Lambs Quarters Anaphylaxis    ALL "mammal" MEAT per pt -- Muscle pain  Can eat chicken, fish, [place]  . Milk-Related Compounds Anaphylaxis    Nasal stuffiness Cheese (mozzarella, swiss, cheddar, cottage)  . Pork Allergy Anaphylaxis    ALL "mammal" MEAT per pt -- Muscle pain  Can eat chicken, fish, [place]  . Wheat Bran  Anaphylaxis and Diarrhea     Takes claritin every day and benadryl, if needed, to prevent anaphylaxis   . Whey Anaphylaxis and Diarrhea     Takes claritin every day and benadryl, if needed, to prevent anaphylaxis   . Yeast Anaphylaxis     Takes claritin every day and benadryl, if needed, to prevent anaphylaxis  . Soy Allergy Diarrhea  . Almond Oil Nausea And Vomiting    Current Outpatient Prescriptions  Medication Sig Dispense Refill  . calcium carbonate (TUMS - DOSED IN MG ELEMENTAL CALCIUM) 500 MG chewable tablet Chew 500 mg by mouth daily as  needed for indigestion or heartburn.     . diphenhydrAMINE (SOMINEX) 25 MG tablet Take 25 mg by mouth at bedtime as needed. Reported on 05/20/2015    . famotidine (PEPCID) 20 MG tablet Take 20 mg by mouth daily.     Marland Kitchen liothyronine (CYTOMEL) 5 MCG tablet Take 5 mcg by mouth daily.    Marland Kitchen loratadine (CLARITIN) 10 MG tablet Take 10 mg by mouth daily.    . Magnesium Citrate 100 MG TABS Take 200 mg by mouth daily.    . Methylcellulose, Laxative, (CITRUCEL) 500 MG TABS Take 2,000 mg by mouth 2 (two) times daily.    . naproxen sodium (ANAPROX) 220 MG tablet Take 440 mg by mouth 3 (three) times daily with meals.    . ondansetron (ZOFRAN) 4 MG tablet Take 4 mg by mouth every 8 (eight) hours as needed for nausea or vomiting.    Vladimir Faster Glycol-Propyl Glycol (SYSTANE ULTRA) 0.4-0.3 % SOLN Place 1 drop into both eyes daily as needed (dry eyes).    . Probiotic Product (ALIGN PO) Take 1 tablet by mouth daily.     . promethazine (PHENERGAN) 12.5 MG tablet Take 12.5 mg by mouth every 6 (six) hours as needed for nausea or vomiting.    Marland Kitchen VITAMIN D-VITAMIN K PO Take 1 capsule by mouth daily. Reported on 04/15/2015     No current facility-administered medications for this visit.     OBJECTIVE: Middle-aged white woman Who appears well Vitals:   09/30/15 1146  BP: 138/87  Pulse: 79  Resp: 18  Temp: 97.9 F (36.6 C)     Body mass index is 29.71 kg/m.    ECOG FS:0 - Asymptomatic  Sclerae unicteric, EOMs intact Oropharynx clear and moist No cervical or supraclavicular adenopathy Lungs no rales or rhonchi Heart regular rate and rhythm Abd soft, nontender, positive bowel sounds MSK no focal spinal tenderness, no upper extremity lymphedema Neuro: nonfocal, well oriented, appropriate affect Breasts: Status post bilateral mastectomies and bilateral breast reductions. The cosmetic result is excellent. There is no dehiscence, erythema, swelling, or unusual tenderness. The breast contour is well preserved  bilaterally. Both axillae are benign.    LAB RESULTS:  CMP     Component Value Date/Time   NA 142 09/30/2015 1132   K 4.0 09/30/2015 1132   CO2 25 09/30/2015 1132   GLUCOSE 96 09/30/2015 1132   BUN 10.8 09/30/2015 1132   CREATININE 1.0 09/30/2015 1132   CALCIUM 10.2 09/30/2015 1132   PROT 7.4 09/30/2015 1132   ALBUMIN 3.6 09/30/2015 1132   AST 18 09/30/2015 1132   ALT 20 09/30/2015 1132   ALKPHOS 68 09/30/2015 1132   BILITOT 0.33 09/30/2015 1132    INo results found for: SPEP, UPEP  Lab Results  Component Value Date   WBC 5.4 09/30/2015   NEUTROABS 2.7 09/30/2015   HGB 12.7 09/30/2015  HCT 38.1 09/30/2015   MCV 89.6 09/30/2015   PLT 187 09/30/2015      Chemistry      Component Value Date/Time   NA 142 09/30/2015 1132   K 4.0 09/30/2015 1132   CO2 25 09/30/2015 1132   BUN 10.8 09/30/2015 1132   CREATININE 1.0 09/30/2015 1132      Component Value Date/Time   CALCIUM 10.2 09/30/2015 1132   ALKPHOS 68 09/30/2015 1132   AST 18 09/30/2015 1132   ALT 20 09/30/2015 1132   BILITOT 0.33 09/30/2015 1132       No results found for: LABCA2  No components found for: LABCA125  No results for input(s): INR in the last 168 hours.  Urinalysis No results found for: COLORURINE, APPEARANCEUR, LABSPEC, PHURINE, GLUCOSEU, HGBUR, BILIRUBINUR, KETONESUR, PROTEINUR, UROBILINOGEN, NITRITE, LEUKOCYTESUR   ELIGIBLE FOR AVAILABLE RESEARCH PROTOCOL: no  STUDIES: CLINICAL DATA:  Specimen radiograph  EXAM: SPECIMEN RADIOGRAPH OF THE RIGHT BREAST  COMPARISON:  Previous exam(s).  FINDINGS: Status post excision of the right breast. The radioactive seed and biopsy marker clip are present, completely intact, and were marked for pathology. The clip and seed appears closed the margins of the specimen, however I was told that a subsequent additional margin was obtained.  IMPRESSION: Specimen radiograph of the right breast.   Electronically Signed   By: Ammie Ferrier M.D.   On: 08/07/2015 11:58  ASSESSMENT: 75 y.o. [facility] woman  status post left breast upper outer quadrant biopsy 02/27/2015 for a clinical mT1-3 N1 invasive ductal carcinoma, grade 2, strongly estrogen and progesterone receptor positive, HER-2 amplified, with an MIB-1 of 20%.   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  (b) right breast lower outer quadrant: a clinical T1b N0, stage IA invasive ductal carcinoma, estrogen and progesterone receptor positive, HER-2 not amplified, with an Mib-1 of 4%  (2)  neoadjuvant chemotherapy consisting of weekly paclitaxel 12 together with weekly trastuzumab started 04/29/2015  (a) 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  (a) echo 07/24//2017 shows an ejection fraction of 55-60%   (4) on 08/07/2015 she underwent  (a) right lumpectomy and sentinel lymph node sampling showing ypT0 ypN0 (complete pathologic response)  (b) two left lumpectomies, for ympT2 ypN1 invasive ductal carcinoma, grade 1, with positive margins.  (5) additional left breast surgery 09/02/2015 cleared the margins (the closest being less than 0.1 cm left superior for DCIS)  (6) adjuvant radiation therapy to follow surgery  (7) tamoxifen to start at the completion of local treatment--not a PALLAS trial candidate as HER-2 positive  (a) the patient has been asked to discontinue Estring as of 05/05/2015, to  be resumed when tamoxifen is started   (8) incidentally noted left lower lobe 0.7 cm nodule on 07/10/2015 MRI , requiring eventual follow-up  PLAN: Alleah is finally done with her breast surgery and the end result is excellent. She has very symmetrical breasts, which fall with a very normal profile. Since she will receive radiation to  both breasts the symmetry is likely to be maintained. She is very pleased with these results.  She will start her radiation next week. Likely this will continue until around mid November. We did discuss the importance of her continuing to exercise on a regular basis and actually increase her exercise activities now that she becomes more tired she will be able to maintain better functionality.  We are continuing the trastuzumab every 3 weeks. Her last dose will be in mid April.  She is scheduled to see me again in December. Just before the December visit she will have a repeat chest CT to evaluate the left lower lobe nodule incidentally noted in her July breast MRI. He understands this is likely to be benign.  At the December visit we will further discuss tamoxifen and we will probably start that 01/05/2016. Once she is on tamoxifen and we can demonstrate tolerance I will start her on vaginal estrogens for her severe vaginal atrophy symptoms. Until then, if she has other urinary tract infection symptoms, she should have urine cultures before antibiotics because she is feeling may or may not be infectious, it may be simple inflammation.  She knows to call for any problems that may develop before her next visit here.   [name], MD   09/30/2015 12:49 PM

## 2015-09-30 NOTE — Patient Instructions (Signed)
[facility] Discharge Instructions for Patients  Today you received the following: Herceptin   To help prevent nausea and vomiting after your treatment, we encourage you to take your nausea medication as directed.   If you develop nausea and vomiting that is not controlled by your nausea medication, call the clinic.   BELOW ARE SYMPTOMS THAT SHOULD BE REPORTED IMMEDIATELY:  *FEVER GREATER THAN 100.5 F  *CHILLS WITH OR WITHOUT FEVER  NAUSEA AND VOMITING THAT IS NOT CONTROLLED WITH YOUR NAUSEA MEDICATION  *UNUSUAL SHORTNESS OF BREATH  *UNUSUAL BRUISING OR BLEEDING  TENDERNESS IN MOUTH AND THROAT WITH OR WITHOUT PRESENCE OF ULCERS  *URINARY PROBLEMS  *BOWEL PROBLEMS  UNUSUAL RASH Items with * indicate a potential emergency and should be followed up as soon as possible.  Feel free to call the clinic you have any questions or concerns. The clinic phone number is [phone].  Please show the CHEMO ALERT CARD at check-in to the Emergency Department and triage nurse.   

## 2015-10-01 DIAGNOSIS — Z510 Encounter for antineoplastic radiation therapy: Secondary | ICD-10-CM | POA: Diagnosis not present

## 2015-10-06 ENCOUNTER — Ambulatory Visit
Admission: RE | Admit: 2015-10-06 | Discharge: 2015-10-06 | Disposition: A | Discharge status: Home / Self Care | Payer: Medicare Other | Source: Ambulatory Visit | Attending: Radiation Oncology | Admitting: Radiation Oncology

## 2015-10-06 DIAGNOSIS — Z170 Estrogen receptor positive status [ER+]: Secondary | ICD-10-CM | POA: Diagnosis not present

## 2015-10-06 DIAGNOSIS — C50412 Malignant neoplasm of upper-outer quadrant of left female breast: Secondary | ICD-10-CM | POA: Diagnosis not present

## 2015-10-06 DIAGNOSIS — C50511 Malignant neoplasm of lower-outer quadrant of right female breast: Secondary | ICD-10-CM | POA: Diagnosis not present

## 2015-10-06 DIAGNOSIS — Z510 Encounter for antineoplastic radiation therapy: Secondary | ICD-10-CM | POA: Diagnosis not present

## 2015-10-07 ENCOUNTER — Ambulatory Visit
Admission: RE | Admit: 2015-10-07 | Discharge: 2015-10-07 | Disposition: A | Discharge status: Home / Self Care | Payer: Medicare Other | Source: Ambulatory Visit | Attending: Radiation Oncology | Admitting: Radiation Oncology

## 2015-10-07 DIAGNOSIS — Z510 Encounter for antineoplastic radiation therapy: Secondary | ICD-10-CM | POA: Diagnosis not present

## 2015-10-08 ENCOUNTER — Ambulatory Visit
Admission: RE | Admit: 2015-10-08 | Discharge: 2015-10-08 | Disposition: A | Discharge status: Home / Self Care | Payer: Medicare Other | Source: Ambulatory Visit | Attending: Radiation Oncology | Admitting: Radiation Oncology

## 2015-10-08 DIAGNOSIS — Z510 Encounter for antineoplastic radiation therapy: Secondary | ICD-10-CM | POA: Diagnosis not present

## 2015-10-09 ENCOUNTER — Ambulatory Visit
Admission: RE | Admit: 2015-10-09 | Discharge: 2015-10-09 | Disposition: A | Discharge status: Home / Self Care | Payer: Medicare Other | Source: Ambulatory Visit | Attending: Radiation Oncology | Admitting: Radiation Oncology

## 2015-10-09 DIAGNOSIS — C50412 Malignant neoplasm of upper-outer quadrant of left female breast: Secondary | ICD-10-CM

## 2015-10-09 DIAGNOSIS — C50511 Malignant neoplasm of lower-outer quadrant of right female breast: Secondary | ICD-10-CM

## 2015-10-09 DIAGNOSIS — Z510 Encounter for antineoplastic radiation therapy: Secondary | ICD-10-CM | POA: Diagnosis not present

## 2015-10-09 MED ORDER — ALRA NON-METALLIC DEODORANT (RAD-ONC)
1.0000 "application " | Freq: Once | TOPICAL | Status: AC
  Administered 2015-10-09: 1 via TOPICAL

## 2015-10-09 NOTE — Progress Notes (Signed)
Pt here for patient teaching.  Pt given Radiation and You booklet, skin care instructions and Alra deodorant. Pt reports they have not watched the Radiation Therapy Education video, but were given the link to watch at home.  Reviewed areas of pertinence such as fatigue, skin changes, breast tenderness and breast swelling . Pt able to give teach back of to pat skin, use unscented/gentle soap and drink plenty of water,avoid applying anything to skin within 4 hours of treatment, avoid wearing an under wire bra and to use an electric razor if they must shave. Pt verbalizes understanding of information given and will contact nursing with any questions or concerns.     Http://rtanswers.org/treatmentinformation/whattoexpect/index  Ms. Ornstein has an allergy to eggs which produces a contradiction to her using Radiaplex cream. I provided her with the cream without realizing this at the time. I have called her and left a voice mail message informing her of the contradiction, and to not use the Radiaplex. I left my number and advised her to call me back when she was able.

## 2015-10-10 ENCOUNTER — Ambulatory Visit
Admission: RE | Admit: 2015-10-10 | Discharge: 2015-10-10 | Disposition: A | Discharge status: Home / Self Care | Payer: Medicare Other | Source: Ambulatory Visit | Attending: Radiation Oncology | Admitting: Radiation Oncology

## 2015-10-10 DIAGNOSIS — Z510 Encounter for antineoplastic radiation therapy: Secondary | ICD-10-CM | POA: Diagnosis not present

## 2015-10-13 ENCOUNTER — Encounter: Payer: Self-pay | Admitting: Radiation Oncology

## 2015-10-13 ENCOUNTER — Ambulatory Visit
Admission: RE | Admit: 2015-10-13 | Discharge: 2015-10-13 | Disposition: A | Discharge status: Home / Self Care | Payer: Medicare Other | Source: Ambulatory Visit | Attending: Radiation Oncology | Admitting: Radiation Oncology

## 2015-10-13 VITALS — BP 144/78 | HR 72 | Temp 97.8°F | Ht 63.5 in | Wt 173.0 lb

## 2015-10-13 DIAGNOSIS — Z170 Estrogen receptor positive status [ER+]: Principal | ICD-10-CM

## 2015-10-13 DIAGNOSIS — C50511 Malignant neoplasm of lower-outer quadrant of right female breast: Secondary | ICD-10-CM

## 2015-10-13 DIAGNOSIS — Z510 Encounter for antineoplastic radiation therapy: Secondary | ICD-10-CM | POA: Diagnosis not present

## 2015-10-13 DIAGNOSIS — C50412 Malignant neoplasm of upper-outer quadrant of left female breast: Secondary | ICD-10-CM

## 2015-10-13 NOTE — Progress Notes (Signed)
   Weekly Management Note:  Outpatient    ICD-9-CM ICD-10-CM   1. Malignant neoplasm of lower-outer quadrant of right breast of female, estrogen receptor positive ([place]) 174.5 C50.511    V86.0 Z17.0   2. Malignant neoplasm of upper-outer quadrant of left breast in female, estrogen receptor positive ([facility]) 174.4 C50.412    V86.0 Z17.0     Current Dose:  10 Gy  Projected Dose: 50 Gy to right breast, 60 Gy to left breast with regional nodal treatment on left as well.  Narrative:  The patient presents for routine under treatment assessment.  CBCT/MVCT images/Port film x-rays were reviewed.  The chart was checked. Doing well.   Physical Findings:  height is 5' 3.5" (1.613 m) and weight is 173 lb (78.5 kg). Her temperature is 97.8 F (36.6 C). Her blood pressure is 144/78 (abnormal) and her pulse is 72.   Wt Readings from Last 3 Encounters:  10/13/15 173 lb (78.5 kg)  09/30/15 170 lb 6.4 oz (77.3 kg)  09/29/15 173 lb 9.6 oz (78.7 kg)   Erythema over bilateral breasts  Impression:  The patient is tolerating radiotherapy.  Plan:  Continue radiotherapy as planned. Patient instructed to apply radiation healing lotion in treatment fields.  ________________________________   Eppie [facility], M.D.

## 2015-10-13 NOTE — Progress Notes (Signed)
Isabella Cook is here for her 5th fraction of radiation to her Bialteral Breasts. She denies pain or fatigue. She denies any skin changes to her Blilateral Breasts. She is using sonafine cream twice daily to her Bilateral Breasts.   BP (!) 144/78   Pulse 72   Temp 97.8 F (36.6 C)   Ht 5' 3.5" (1.613 m)   Wt 173 lb (78.5 kg)   BMI 30.16 kg/m    Wt Readings from Last 3 Encounters:  10/13/15 173 lb (78.5 kg)  09/30/15 170 lb 6.4 oz (77.3 kg)  09/29/15 173 lb 9.6 oz (78.7 kg)

## 2015-10-14 ENCOUNTER — Ambulatory Visit
Admission: RE | Admit: 2015-10-14 | Discharge: 2015-10-14 | Disposition: A | Discharge status: Home / Self Care | Payer: Medicare Other | Source: Ambulatory Visit | Attending: Radiation Oncology | Admitting: Radiation Oncology

## 2015-10-14 DIAGNOSIS — Z510 Encounter for antineoplastic radiation therapy: Secondary | ICD-10-CM | POA: Diagnosis not present

## 2015-10-15 ENCOUNTER — Ambulatory Visit
Admission: RE | Admit: 2015-10-15 | Discharge: 2015-10-15 | Disposition: A | Discharge status: Home / Self Care | Payer: Medicare Other | Source: Ambulatory Visit | Attending: Radiation Oncology | Admitting: Radiation Oncology

## 2015-10-15 DIAGNOSIS — Z510 Encounter for antineoplastic radiation therapy: Secondary | ICD-10-CM | POA: Diagnosis not present

## 2015-10-16 ENCOUNTER — Ambulatory Visit
Admission: RE | Admit: 2015-10-16 | Discharge: 2015-10-16 | Disposition: A | Discharge status: Home / Self Care | Payer: Medicare Other | Source: Ambulatory Visit | Attending: Radiation Oncology | Admitting: Radiation Oncology

## 2015-10-16 DIAGNOSIS — Z510 Encounter for antineoplastic radiation therapy: Secondary | ICD-10-CM | POA: Diagnosis not present

## 2015-10-17 ENCOUNTER — Ambulatory Visit
Admission: RE | Admit: 2015-10-17 | Discharge: 2015-10-17 | Disposition: A | Discharge status: Home / Self Care | Payer: Medicare Other | Source: Ambulatory Visit | Attending: Radiation Oncology | Admitting: Radiation Oncology

## 2015-10-17 DIAGNOSIS — Z510 Encounter for antineoplastic radiation therapy: Secondary | ICD-10-CM | POA: Diagnosis not present

## 2015-10-20 ENCOUNTER — Ambulatory Visit
Admission: RE | Admit: 2015-10-20 | Discharge: 2015-10-20 | Disposition: A | Discharge status: Home / Self Care | Payer: Medicare Other | Source: Ambulatory Visit | Attending: Radiation Oncology | Admitting: Radiation Oncology

## 2015-10-20 ENCOUNTER — Ambulatory Visit (HOSPITAL_BASED_OUTPATIENT_CLINIC_OR_DEPARTMENT_OTHER)
Admission: RE | Admit: 2015-10-20 | Discharge: 2015-10-20 | Disposition: A | Discharge status: Home / Self Care | Payer: Medicare Other | Source: Ambulatory Visit | Attending: Internal Medicine | Admitting: Internal Medicine

## 2015-10-20 ENCOUNTER — Ambulatory Visit (HOSPITAL_COMMUNITY)
Admission: RE | Admit: 2015-10-20 | Discharge: 2015-10-20 | Disposition: A | Discharge status: Home / Self Care | Payer: Medicare Other | Source: Ambulatory Visit | Attending: Internal Medicine | Admitting: Internal Medicine

## 2015-10-20 ENCOUNTER — Other Ambulatory Visit (HOSPITAL_COMMUNITY): Payer: Self-pay | Admitting: Internal Medicine

## 2015-10-20 ENCOUNTER — Encounter: Payer: Self-pay | Admitting: Radiation Oncology

## 2015-10-20 VITALS — BP 142/80 | HR 78 | Wt 173.4 lb

## 2015-10-20 VITALS — BP 146/87 | HR 68 | Temp 97.5°F | Resp 16 | Wt 172.8 lb

## 2015-10-20 DIAGNOSIS — C50412 Malignant neoplasm of upper-outer quadrant of left female breast: Secondary | ICD-10-CM | POA: Diagnosis present

## 2015-10-20 DIAGNOSIS — Z853 Personal history of malignant neoplasm of breast: Secondary | ICD-10-CM | POA: Insufficient documentation

## 2015-10-20 DIAGNOSIS — C50511 Malignant neoplasm of lower-outer quadrant of right female breast: Secondary | ICD-10-CM

## 2015-10-20 DIAGNOSIS — Z9889 Other specified postprocedural states: Secondary | ICD-10-CM | POA: Diagnosis not present

## 2015-10-20 DIAGNOSIS — K219 Gastro-esophageal reflux disease without esophagitis: Secondary | ICD-10-CM | POA: Insufficient documentation

## 2015-10-20 DIAGNOSIS — I501 Left ventricular failure: Secondary | ICD-10-CM | POA: Insufficient documentation

## 2015-10-20 DIAGNOSIS — Z170 Estrogen receptor positive status [ER+]: Principal | ICD-10-CM

## 2015-10-20 DIAGNOSIS — Z0189 Encounter for other specified special examinations: Secondary | ICD-10-CM

## 2015-10-20 DIAGNOSIS — Z8249 Family history of ischemic heart disease and other diseases of the circulatory system: Secondary | ICD-10-CM | POA: Diagnosis not present

## 2015-10-20 DIAGNOSIS — I340 Nonrheumatic mitral (valve) insufficiency: Secondary | ICD-10-CM | POA: Diagnosis not present

## 2015-10-20 DIAGNOSIS — Z510 Encounter for antineoplastic radiation therapy: Secondary | ICD-10-CM | POA: Diagnosis not present

## 2015-10-20 DIAGNOSIS — Z9221 Personal history of antineoplastic chemotherapy: Secondary | ICD-10-CM | POA: Insufficient documentation

## 2015-10-20 NOTE — Progress Notes (Signed)
  Echocardiogram 2D Echocardiogram has been performed.  Isabella Cook M 10/20/2015, 11:34 AM

## 2015-10-20 NOTE — Progress Notes (Signed)
Weekly radiation B/L breast 10/25 completed, mild erythema  only, skin intact, using radiaplex bid, had echocardiogram thjis am,  Herceptin tomorrow , appetite good, no fatiue stated nopain 1:21 PM BP (!) 146/87 (BP Location: Right Leg, Patient Position: Sitting, Cuff Size: Normal)   Pulse 68   Temp 97.5 F (36.4 C) (Oral)   Resp 16   Wt 172 lb 12.8 oz (78.4 kg)   BMI 30.13 kg/m   Wt Readings from Last 3 Encounters:  10/20/15 172 lb 12.8 oz (78.4 kg)  10/20/15 173 lb 6.4 oz (78.7 kg)  10/13/15 173 lb (78.5 kg)

## 2015-10-20 NOTE — Progress Notes (Signed)
   Weekly Management Note:  Outpatient    ICD-9-CM ICD-10-CM   1. Malignant neoplasm of lower-outer quadrant of right breast of female, estrogen receptor positive ([place]) 174.5 C50.511    V86.0 Z17.0   2. Malignant neoplasm of upper-outer quadrant of left breast in female, estrogen receptor positive ([facility]) 174.4 C50.412    V86.0 Z17.0     Current Dose:  20 Gy  Projected Dose: 50 Gy to right breast, 60 Gy to left breast with regional nodal treatment on left as well.  Narrative:  The patient presents for routine under treatment assessment.  CBCT/MVCT images/Port film x-rays were reviewed.  The chart was checked. Doing well.   Physical Findings:  weight is 172 lb 12.8 oz (78.4 kg). Her oral temperature is 97.5 F (36.4 C). Her blood pressure is 146/87 (abnormal) and her pulse is 68. Her respiration is 16.   Wt Readings from Last 3 Encounters:  10/20/15 172 lb 12.8 oz (78.4 kg)  10/20/15 173 lb 6.4 oz (78.7 kg)  10/13/15 173 lb (78.5 kg)   Erythema over bilateral breasts; skin intact  Impression:  The patient is tolerating radiotherapy.  Plan:  Continue radiotherapy as planned. Patient instructed to apply radiation healing lotion in treatment fields.  ________________________________   Eppie [facility], M.D.

## 2015-10-20 NOTE — Patient Instructions (Addendum)
3 month follow-up

## 2015-10-20 NOTE — Progress Notes (Signed)
Patient ID: Isabella Cook, female   DOB: [birth year], 75 y.o.   MRN: [id number]    Advanced Heart Failure/ Cardio-Oncology Clinic Note   Oncology: Dr. Shonna Chock is a 75 yo woman with h/o GERD and  bilateral breast cancer, diagnosed 2/17.  ER+/PR+/HER2+.    On 03/25/2015 the patient underwent bilateral breast MRI, which showed the breast density to be category B. this showed, in the left breast, an area of non-masslike enhancement measuring 7.8 cm. Associated with this were 2 adjacent irregular masses measuring 1.7 and 1.4 cm. The masses were 5 cm anterior and superior to the biopsy clip. There was a cortically thickened left axillary lymph node noted.  Underwent neoadjuvant chemotherapy consisting of weekly paclitaxel 12 together with weekly trastuzumab started 04/29/2015. 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 Carboplatin/gemcitabine substituted, starting 06/10/2015 (total of 4 doses [2 D1, D8 cycles]   She presents today for follow up with Echo. Has finished chemo. S/p lumpectomy 08/07/15. On herceptin alone. Doing well. No HF symptoms. Now getting XRT as well.    Echo 04/25/15 LVEF 55-60%, Grade 1 DD, Lat s' 11.5, GLS -19.1% Echo 07/28/15 LVEF 55-60%, Grade 1 DD, Lat s' 11.3, GLS -14.3% (Underestimated due to poor tracking) Echo 10/20/15 LVEF 60-65%, Grade 1 DD, Lat s' 10.6, GLS -19.6%  PMH: 1. GERD 2. Breast cancer: Bilateral.  Diagnosed 2/17.  ER+/PR+/HER2+.  Plan for weekly paclitaxel + Herceptin x 12 wks then Herceptin every 3 weeks x 9 more months.  Surgery after chemo.  - Echo (4/17) with EF 55-60%, mild LVH, normal RV size and systolic function, lateral s' 11.5 cm/sec, GLS -19.1%.   Social History   Social History  . Marital status: Married    Spouse name: N/A  . Number of children: N/A  . Years of education: N/A   Occupational History  . Not on file.   Social History Main Topics  . Smoking status: Never Smoker  . Smokeless tobacco: Never Used  .  Alcohol use Yes     Comment: occ 1 every 2-3 months  . Drug use: No  . Sexual activity: Not on file   Other Topics Concern  . Not on file   Social History Narrative  . No narrative on file   FH: Mother with CHF, uncertain cause.   ROS: All systems reviewed and negative except as per HPI.   Current Outpatient Prescriptions  Medication Sig Dispense Refill  . calcium carbonate (TUMS - DOSED IN MG ELEMENTAL CALCIUM) 500 MG chewable tablet Chew 500 mg by mouth daily as needed for indigestion or heartburn.     . diphenhydrAMINE (SOMINEX) 25 MG tablet Take 25 mg by mouth at bedtime as needed. Reported on 05/20/2015    . famotidine (PEPCID) 20 MG tablet Take 20 mg by mouth daily.     Marland Kitchen lidocaine-prilocaine (EMLA) cream Apply to affected area once    . liothyronine (CYTOMEL) 5 MCG tablet Take 5 mcg by mouth daily.    Marland Kitchen loratadine (CLARITIN) 10 MG tablet Take 10 mg by mouth daily.    . Magnesium Citrate 100 MG TABS Take 200 mg by mouth daily.    . Methylcellulose, Laxative, (CITRUCEL) 500 MG TABS Take 2,000 mg by mouth 2 (two) times daily.    . naproxen sodium (ANAPROX) 220 MG tablet Take 440 mg by mouth 3 (three) times daily with meals as needed.     . ondansetron (ZOFRAN) 4 MG tablet Take 4 mg by mouth every 8 (  eight) hours as needed for nausea or vomiting.    Vladimir Faster Glycol-Propyl Glycol (SYSTANE ULTRA) 0.4-0.3 % SOLN Place 1 drop into both eyes daily as needed (dry eyes).    . Probiotic Product (ALIGN PO) Take 1 tablet by mouth daily.     . promethazine (PHENERGAN) 12.5 MG tablet Take 12.5 mg by mouth every 6 (six) hours as needed for nausea or vomiting.    Marland Kitchen VITAMIN D-VITAMIN K PO Take 1 capsule by mouth daily. Reported on 04/15/2015    . UNABLE TO FIND Place into the nose.     No current facility-administered medications for this encounter.    Vitals:   10/20/15 1147  BP: (!) 142/80  Pulse: 78  SpO2: 95%  Weight: 173 lb 6.4 oz (78.7 kg)   Wt Readings from Last 3 Encounters:    10/20/15 173 lb 6.4 oz (78.7 kg)  10/13/15 173 lb (78.5 kg)  09/30/15 170 lb 6.4 oz (77.3 kg)    General: NAD Neck: No JVD, no thyromegaly or thyroid nodule. No carotid bruit Lungs: CTAB, normal effort. CV: Nondisplaced PMI.  Heart regular S1/S2, no S3/S4, 1/6 SEM RUSB.   Left chest port-a-cath Abdomen: Soft, NT, ND, no HSM. No bruits or masses. +BS  Skin: Intact without lesions or rashes.  Neurologic: Alert and oriented x 3.  Psych: Normal affect. Extremities: No clubbing, cyanosis, rash, or edema. + pedal pulses. HEENT: Normal.   Assessment/plan: 1. Bilateral breast cancer, ER+/PR+/HER2+. S/p lumpectomy - Echo today reviewed personally. Parameters stable.  - To continue Herceptin therapy until 04/2016  Shirley Friar PA-C 10/20/2015  Patient seen and examined with Oda Kilts, PA-C. We discussed all aspects of the encounter. I agree with the assessment and plan as stated above.   I reviewed echos personally. EF and Doppler parameters stable. No HF on exam. Continue Herceptin.   Zaidan Keeble,MD 12:14 PM

## 2015-10-21 ENCOUNTER — Ambulatory Visit (HOSPITAL_BASED_OUTPATIENT_CLINIC_OR_DEPARTMENT_OTHER): Payer: Medicare Other

## 2015-10-21 ENCOUNTER — Ambulatory Visit
Admission: RE | Admit: 2015-10-21 | Discharge: 2015-10-21 | Disposition: A | Discharge status: Home / Self Care | Payer: Medicare Other | Source: Ambulatory Visit | Attending: Radiation Oncology | Admitting: Radiation Oncology

## 2015-10-21 ENCOUNTER — Other Ambulatory Visit (HOSPITAL_BASED_OUTPATIENT_CLINIC_OR_DEPARTMENT_OTHER): Payer: Medicare Other

## 2015-10-21 DIAGNOSIS — C50412 Malignant neoplasm of upper-outer quadrant of left female breast: Secondary | ICD-10-CM

## 2015-10-21 DIAGNOSIS — Z5112 Encounter for antineoplastic immunotherapy: Secondary | ICD-10-CM

## 2015-10-21 DIAGNOSIS — C50511 Malignant neoplasm of lower-outer quadrant of right female breast: Secondary | ICD-10-CM

## 2015-10-21 DIAGNOSIS — Z510 Encounter for antineoplastic radiation therapy: Secondary | ICD-10-CM | POA: Diagnosis not present

## 2015-10-21 LAB — CBC WITH DIFFERENTIAL/PLATELET
BASO%: 0.9 % (ref 0.0–2.0)
BASOS ABS: 0 10*3/uL (ref 0.0–0.1)
EOS ABS: 0.2 10*3/uL (ref 0.0–0.5)
EOS%: 5.4 % (ref 0.0–7.0)
HCT: 39.9 % (ref 34.8–46.6)
HGB: 13 g/dL (ref 11.6–15.9)
LYMPH%: 17.6 % (ref 14.0–49.7)
MCH: 29.1 pg (ref 25.1–34.0)
MCHC: 32.6 g/dL (ref 31.5–36.0)
MCV: 89.5 fL (ref 79.5–101.0)
MONO#: 0.6 10*3/uL (ref 0.1–0.9)
MONO%: 13.5 % (ref 0.0–14.0)
NEUT%: 62.6 % (ref 38.4–76.8)
NEUTROS ABS: 2.8 10*3/uL (ref 1.5–6.5)
PLATELETS: 175 10*3/uL (ref 145–400)
RBC: 4.46 10*6/uL (ref 3.70–5.45)
RDW: 13.9 % (ref 11.2–14.5)
WBC: 4.4 10*3/uL (ref 3.9–10.3)
lymph#: 0.8 10*3/uL — ABNORMAL LOW (ref 0.9–3.3)

## 2015-10-21 LAB — COMPREHENSIVE METABOLIC PANEL
ALT: 15 U/L (ref 0–55)
ANION GAP: 9 meq/L (ref 3–11)
AST: 16 U/L (ref 5–34)
Albumin: 3.9 g/dL (ref 3.5–5.0)
Alkaline Phosphatase: 73 U/L (ref 40–150)
BILIRUBIN TOTAL: 0.32 mg/dL (ref 0.20–1.20)
BUN: 10.5 mg/dL (ref 7.0–26.0)
CO2: 26 meq/L (ref 22–29)
Calcium: 10.7 mg/dL — ABNORMAL HIGH (ref 8.4–10.4)
Chloride: 107 mEq/L (ref 98–109)
Creatinine: 1 mg/dL (ref 0.6–1.1)
EGFR: 57 mL/min/{1.73_m2} — AB (ref >90)
Glucose: 92 mg/dl (ref 70–140)
Potassium: 4.2 mEq/L (ref 3.5–5.1)
Sodium: 142 mEq/L (ref 136–145)
TOTAL PROTEIN: 7.7 g/dL (ref 6.4–8.3)

## 2015-10-21 MED ORDER — DIPHENHYDRAMINE HCL 50 MG/ML IJ SOLN
25.0000 mg | Freq: Once | INTRAMUSCULAR | Status: AC
  Administered 2015-10-21: 25 mg via INTRAVENOUS

## 2015-10-21 MED ORDER — SODIUM CHLORIDE 0.9 % IV SOLN
Freq: Once | INTRAVENOUS | Status: AC
  Administered 2015-10-21: 12:00:00 via INTRAVENOUS

## 2015-10-21 MED ORDER — ACETAMINOPHEN 325 MG PO TABS
ORAL_TABLET | ORAL | Status: AC
  Filled 2015-10-21: qty 2

## 2015-10-21 MED ORDER — DIPHENHYDRAMINE HCL 50 MG/ML IJ SOLN
INTRAMUSCULAR | Status: AC
  Filled 2015-10-21: qty 1

## 2015-10-21 MED ORDER — TRASTUZUMAB CHEMO 150 MG IV SOLR
6.0000 mg/kg | Freq: Once | INTRAVENOUS | Status: AC
  Administered 2015-10-21: 483 mg via INTRAVENOUS
  Filled 2015-10-21: qty 23

## 2015-10-21 MED ORDER — ACETAMINOPHEN 325 MG PO TABS
650.0000 mg | ORAL_TABLET | Freq: Once | ORAL | Status: AC
  Administered 2015-10-21: 650 mg via ORAL

## 2015-10-21 MED ORDER — SODIUM CHLORIDE 0.9% FLUSH
10.0000 mL | INTRAVENOUS | Status: DC | PRN
Start: 2015-10-21 — End: 2015-10-21
  Administered 2015-10-21: 10 mL
  Filled 2015-10-21: qty 10

## 2015-10-21 MED ORDER — HEPARIN SOD (PORK) LOCK FLUSH 100 UNIT/ML IV SOLN
500.0000 [IU] | Freq: Once | INTRAVENOUS | Status: AC | PRN
  Administered 2015-10-21: 500 [IU]
  Filled 2015-10-21: qty 5

## 2015-10-21 NOTE — Patient Instructions (Signed)
[facility] Discharge Instructions for Patients Receiving Chemotherapy  Today you received the following chemotherapy agents Herceptin  To help prevent nausea and vomiting after your treatment, we encourage you to take your nausea medication    If you develop nausea and vomiting that is not controlled by your nausea medication, call the clinic.   BELOW ARE SYMPTOMS THAT SHOULD BE REPORTED IMMEDIATELY:  *FEVER GREATER THAN 100.5 F  *CHILLS WITH OR WITHOUT FEVER  NAUSEA AND VOMITING THAT IS NOT CONTROLLED WITH YOUR NAUSEA MEDICATION  *UNUSUAL SHORTNESS OF BREATH  *UNUSUAL BRUISING OR BLEEDING  TENDERNESS IN MOUTH AND THROAT WITH OR WITHOUT PRESENCE OF ULCERS  *URINARY PROBLEMS  *BOWEL PROBLEMS  UNUSUAL RASH Items with * indicate a potential emergency and should be followed up as soon as possible.  Feel free to call the clinic you have any questions or concerns. The clinic phone number is [phone].  Please show the CHEMO ALERT CARD at check-in to the Emergency Department and triage nurse.   

## 2015-10-22 ENCOUNTER — Ambulatory Visit
Admission: RE | Admit: 2015-10-22 | Discharge: 2015-10-22 | Disposition: A | Discharge status: Home / Self Care | Payer: Medicare Other | Source: Ambulatory Visit | Attending: Radiation Oncology | Admitting: Radiation Oncology

## 2015-10-22 DIAGNOSIS — Z510 Encounter for antineoplastic radiation therapy: Secondary | ICD-10-CM | POA: Diagnosis not present

## 2015-10-23 ENCOUNTER — Ambulatory Visit
Admission: RE | Admit: 2015-10-23 | Discharge: 2015-10-23 | Disposition: A | Discharge status: Home / Self Care | Payer: Medicare Other | Source: Ambulatory Visit | Attending: Radiation Oncology | Admitting: Radiation Oncology

## 2015-10-23 DIAGNOSIS — Z510 Encounter for antineoplastic radiation therapy: Secondary | ICD-10-CM | POA: Diagnosis not present

## 2015-10-24 ENCOUNTER — Ambulatory Visit
Admission: RE | Admit: 2015-10-24 | Discharge: 2015-10-24 | Disposition: A | Discharge status: Home / Self Care | Payer: Medicare Other | Source: Ambulatory Visit | Attending: Radiation Oncology | Admitting: Radiation Oncology

## 2015-10-24 DIAGNOSIS — Z510 Encounter for antineoplastic radiation therapy: Secondary | ICD-10-CM | POA: Diagnosis not present

## 2015-10-27 ENCOUNTER — Ambulatory Visit
Admission: RE | Admit: 2015-10-27 | Discharge: 2015-10-27 | Disposition: A | Discharge status: Home / Self Care | Payer: Medicare Other | Source: Ambulatory Visit | Attending: Radiation Oncology | Admitting: Radiation Oncology

## 2015-10-27 VITALS — BP 130/87 | HR 84 | Temp 98.2°F | Resp 12 | Wt 173.6 lb

## 2015-10-27 DIAGNOSIS — Z170 Estrogen receptor positive status [ER+]: Principal | ICD-10-CM

## 2015-10-27 DIAGNOSIS — C50511 Malignant neoplasm of lower-outer quadrant of right female breast: Secondary | ICD-10-CM

## 2015-10-27 DIAGNOSIS — C50412 Malignant neoplasm of upper-outer quadrant of left female breast: Secondary | ICD-10-CM

## 2015-10-27 DIAGNOSIS — Z510 Encounter for antineoplastic radiation therapy: Secondary | ICD-10-CM | POA: Diagnosis not present

## 2015-10-27 NOTE — Progress Notes (Signed)
PAIN: She is currently in no pain. SKIN: Pt both breasts- positive for erythema and breast tenderness.  Pt reports edema to left axilla.    Pt continues to apply Sonafine as directed. OTHER: Feeling well, in good spirits. BP 130/87   Pulse 84   Temp 98.2 F (36.8 C) (Oral)   Resp 12   Wt 173 lb 9.6 oz (78.7 kg)   BMI 30.27 kg/m  Wt Readings from Last 3 Encounters:  10/27/15 173 lb 9.6 oz (78.7 kg)  10/20/15 172 lb 12.8 oz (78.4 kg)  10/20/15 173 lb 6.4 oz (78.7 kg)

## 2015-10-27 NOTE — Progress Notes (Signed)
   Weekly Management Note:  Outpatient    ICD-9-CM ICD-10-CM   1. Malignant neoplasm of lower-outer quadrant of right breast of female, estrogen receptor positive ([place]) 174.5 C50.511    V86.0 Z17.0   2. Malignant neoplasm of upper-outer quadrant of left breast in female, estrogen receptor positive ([facility]) 174.4 C50.412    V86.0 Z17.0     Current Dose:  30 Gy  Projected Dose: 50 Gy to right breast, 60 Gy to left breast with regional nodal treatment on left as well.  Narrative:  The patient presents for routine under treatment assessment.  CBCT/MVCT images/Port film x-rays were reviewed.  The chart was checked. Doing well.   Physical Findings:  weight is 173 lb 9.6 oz (78.7 kg). Her oral temperature is 98.2 F (36.8 C). Her blood pressure is 130/87 and her pulse is 84. Her respiration is 12.   Wt Readings from Last 3 Encounters:  10/27/15 173 lb 9.6 oz (78.7 kg)  10/20/15 172 lb 12.8 oz (78.4 kg)  10/20/15 173 lb 6.4 oz (78.7 kg)   Erythema over bilateral breasts; skin intact  Impression:  The patient is tolerating radiotherapy.  Plan:  Continue radiotherapy as planned. Continue to apply sonafine, radiation healing lotion in treatment fields.  ________________________________   Eppie Gibson, M.D.

## 2015-10-28 ENCOUNTER — Ambulatory Visit
Admission: RE | Admit: 2015-10-28 | Discharge: 2015-10-28 | Disposition: A | Discharge status: Home / Self Care | Payer: Medicare Other | Source: Ambulatory Visit | Attending: Radiation Oncology | Admitting: Radiation Oncology

## 2015-10-28 DIAGNOSIS — Z510 Encounter for antineoplastic radiation therapy: Secondary | ICD-10-CM | POA: Diagnosis not present

## 2015-10-29 ENCOUNTER — Ambulatory Visit
Admission: RE | Admit: 2015-10-29 | Discharge: 2015-10-29 | Disposition: A | Discharge status: Home / Self Care | Payer: Medicare Other | Source: Ambulatory Visit | Attending: Radiation Oncology | Admitting: Radiation Oncology

## 2015-10-29 DIAGNOSIS — Z510 Encounter for antineoplastic radiation therapy: Secondary | ICD-10-CM | POA: Diagnosis not present

## 2015-10-30 ENCOUNTER — Ambulatory Visit
Admission: RE | Admit: 2015-10-30 | Discharge: 2015-10-30 | Disposition: A | Discharge status: Home / Self Care | Payer: Medicare Other | Source: Ambulatory Visit | Attending: Radiation Oncology | Admitting: Radiation Oncology

## 2015-10-30 DIAGNOSIS — Z510 Encounter for antineoplastic radiation therapy: Secondary | ICD-10-CM | POA: Diagnosis not present

## 2015-10-31 ENCOUNTER — Ambulatory Visit
Admission: RE | Admit: 2015-10-31 | Discharge: 2015-10-31 | Disposition: A | Discharge status: Home / Self Care | Payer: Medicare Other | Source: Ambulatory Visit | Attending: Radiation Oncology | Admitting: Radiation Oncology

## 2015-10-31 DIAGNOSIS — Z510 Encounter for antineoplastic radiation therapy: Secondary | ICD-10-CM | POA: Diagnosis not present

## 2015-11-03 ENCOUNTER — Ambulatory Visit
Admission: RE | Admit: 2015-11-03 | Discharge: 2015-11-03 | Disposition: A | Discharge status: Home / Self Care | Payer: Medicare Other | Source: Ambulatory Visit | Attending: Radiation Oncology | Admitting: Radiation Oncology

## 2015-11-03 ENCOUNTER — Encounter: Payer: Self-pay | Admitting: Radiation Oncology

## 2015-11-03 VITALS — BP 135/82 | HR 79 | Temp 98.0°F | Ht 63.5 in | Wt 173.4 lb

## 2015-11-03 DIAGNOSIS — C50511 Malignant neoplasm of lower-outer quadrant of right female breast: Secondary | ICD-10-CM

## 2015-11-03 DIAGNOSIS — C50412 Malignant neoplasm of upper-outer quadrant of left female breast: Secondary | ICD-10-CM

## 2015-11-03 DIAGNOSIS — Z170 Estrogen receptor positive status [ER+]: Principal | ICD-10-CM

## 2015-11-03 DIAGNOSIS — Z510 Encounter for antineoplastic radiation therapy: Secondary | ICD-10-CM | POA: Diagnosis not present

## 2015-11-03 NOTE — Progress Notes (Signed)
   Weekly Management Note:  Outpatient    ICD-9-CM ICD-10-CM   1. Malignant neoplasm of upper-outer quadrant of left breast in female, estrogen receptor positive ([facility]) 174.4 C50.412    V86.0 Z17.0   2. Malignant neoplasm of lower-outer quadrant of right breast of female, estrogen receptor positive ([facility]) 174.5 C50.511    V86.0 Z17.0     Current Dose:  40 Gy  Projected Dose: 50 Gy to right breast, 60 Gy to left breast with regional nodal treatment on left as well.  Narrative:  The patient presents for routine under treatment assessment.  CBCT/MVCT images/Port film x-rays were reviewed.  The chart was checked. Doing well. Some itching over left clavicle   Physical Findings:  height is 5' 3.5" (1.613 m) and weight is 173 lb 6.4 oz (78.7 kg). Her temperature is 98 F (36.7 C). Her blood pressure is 135/82 and her pulse is 79. Her oxygen saturation is 97%.   Wt Readings from Last 3 Encounters:  11/03/15 173 lb 6.4 oz (78.7 kg)  10/27/15 173 lb 9.6 oz (78.7 kg)  10/20/15 172 lb 12.8 oz (78.4 kg)   Bright erythema over bilateral breasts; skin intact  Impression:  The patient is tolerating radiotherapy.  Plan:  Continue radiotherapy as planned. Continue to apply sonafine, radiation healing lotion in treatment fields. Apply hydrocortisone 1% cream prn itching   Eppie Gibson, M.D.

## 2015-11-03 NOTE — Progress Notes (Signed)
Photon Boost Complex Emergency planning/management officer Note  Diagnosis:   Malignant neoplasm of upper-outer quadrant of left breast in female, estrogen receptor positive ([place]) 174.4 C50.412    The patient's CT images from her CT simulation were reviewed to plan her boost treatment to her left breast  lumpectomy cavity.  The boost to the lumpectomy cavity will be delivered with 3 photon fields using MLCs for custom blocks again heart and lungs, with 6 and 10 MV photon energy.  This constitutes 3 complex treatment devices. Isodose plan was reviewed and approved. 10 Gy in 5 fractions prescribed.  -----------------------------------  Eppie Gibson, MD

## 2015-11-03 NOTE — Progress Notes (Signed)
Isabella Cook is here for her 20th fraction of radiation to her Right and Left Breast. She denies pain or fatigue. Her Bilateral Breasts are red. She does have an area of itching over her Left Clavicle area. She is using sonafine twice daily to her Bilateral Breasts. She uses hydrocortisone cream to the Left Clavicle area.   BP 135/82   Pulse 79   Temp 98 F (36.7 C)   Ht 5' 3.5" (1.613 m)   Wt 173 lb 6.4 oz (78.7 kg)   SpO2 97% Comment: room air  BMI 30.23 kg/m    Wt Readings from Last 3 Encounters:  11/03/15 173 lb 6.4 oz (78.7 kg)  10/27/15 173 lb 9.6 oz (78.7 kg)  10/20/15 172 lb 12.8 oz (78.4 kg)

## 2015-11-04 ENCOUNTER — Ambulatory Visit
Admission: RE | Admit: 2015-11-04 | Discharge: 2015-11-04 | Disposition: A | Discharge status: Home / Self Care | Payer: Medicare Other | Source: Ambulatory Visit | Attending: Radiation Oncology | Admitting: Radiation Oncology

## 2015-11-04 DIAGNOSIS — Z510 Encounter for antineoplastic radiation therapy: Secondary | ICD-10-CM | POA: Diagnosis not present

## 2015-11-05 ENCOUNTER — Ambulatory Visit
Admission: RE | Admit: 2015-11-05 | Discharge: 2015-11-05 | Disposition: A | Discharge status: Home / Self Care | Payer: Medicare Other | Source: Ambulatory Visit | Attending: Radiation Oncology | Admitting: Radiation Oncology

## 2015-11-05 DIAGNOSIS — Z170 Estrogen receptor positive status [ER+]: Secondary | ICD-10-CM | POA: Diagnosis not present

## 2015-11-05 DIAGNOSIS — Z510 Encounter for antineoplastic radiation therapy: Secondary | ICD-10-CM | POA: Diagnosis not present

## 2015-11-05 DIAGNOSIS — C50511 Malignant neoplasm of lower-outer quadrant of right female breast: Secondary | ICD-10-CM | POA: Diagnosis not present

## 2015-11-05 DIAGNOSIS — C50412 Malignant neoplasm of upper-outer quadrant of left female breast: Secondary | ICD-10-CM | POA: Diagnosis not present

## 2015-11-06 ENCOUNTER — Ambulatory Visit
Admission: RE | Admit: 2015-11-06 | Discharge: 2015-11-06 | Disposition: A | Discharge status: Home / Self Care | Payer: Medicare Other | Source: Ambulatory Visit | Attending: Radiation Oncology | Admitting: Radiation Oncology

## 2015-11-06 DIAGNOSIS — Z510 Encounter for antineoplastic radiation therapy: Secondary | ICD-10-CM | POA: Diagnosis not present

## 2015-11-07 ENCOUNTER — Ambulatory Visit
Admission: RE | Admit: 2015-11-07 | Discharge: 2015-11-07 | Disposition: A | Discharge status: Home / Self Care | Payer: Medicare Other | Source: Ambulatory Visit | Attending: Radiation Oncology | Admitting: Radiation Oncology

## 2015-11-07 DIAGNOSIS — Z510 Encounter for antineoplastic radiation therapy: Secondary | ICD-10-CM | POA: Diagnosis not present

## 2015-11-10 ENCOUNTER — Ambulatory Visit
Admission: RE | Admit: 2015-11-10 | Discharge: 2015-11-10 | Disposition: A | Discharge status: Home / Self Care | Payer: Medicare Other | Source: Ambulatory Visit | Attending: Radiation Oncology | Admitting: Radiation Oncology

## 2015-11-10 ENCOUNTER — Encounter: Payer: Self-pay | Admitting: Radiation Oncology

## 2015-11-10 VITALS — BP 142/82 | HR 82 | Temp 97.9°F | Wt 173.4 lb

## 2015-11-10 DIAGNOSIS — Z170 Estrogen receptor positive status [ER+]: Principal | ICD-10-CM

## 2015-11-10 DIAGNOSIS — C50412 Malignant neoplasm of upper-outer quadrant of left female breast: Secondary | ICD-10-CM

## 2015-11-10 DIAGNOSIS — C50511 Malignant neoplasm of lower-outer quadrant of right female breast: Secondary | ICD-10-CM

## 2015-11-10 DIAGNOSIS — Z510 Encounter for antineoplastic radiation therapy: Secondary | ICD-10-CM | POA: Diagnosis not present

## 2015-11-10 NOTE — Progress Notes (Signed)
   Weekly Management Note:  Outpatient    ICD-9-CM ICD-10-CM   1. Malignant neoplasm of lower-outer quadrant of right breast of female, estrogen receptor positive ([place]) 174.5 C50.511    V86.0 Z17.0   2. Malignant neoplasm of upper-outer quadrant of left breast in female, estrogen receptor positive ([facility]) 174.4 C50.412    V86.0 Z17.0     Current Dose:  50 Gy  Projected Dose: 50 Gy to right breast, 60 Gy to left breast with regional nodal treatment on left as well.  Narrative:  The patient presents for routine under treatment assessment.  CBCT/MVCT images/Port film x-rays were reviewed.  The chart was checked. Doing well. Some continued itching over left clavicle. No new complaints.   Physical Findings:  weight is 173 lb 6.4 oz (78.7 kg). Her temperature is 97.9 F (36.6 C). Her blood pressure is 142/82 (abnormal) and her pulse is 82. Her oxygen saturation is 100%.   Wt Readings from Last 3 Encounters:  11/10/15 173 lb 6.4 oz (78.7 kg)  11/03/15 173 lb 6.4 oz (78.7 kg)  10/27/15 173 lb 9.6 oz (78.7 kg)   Bright erythema over bilateral breasts; skin intact. Relatively stable exam  Impression:  The patient is tolerating radiotherapy.  Plan:  Continue radiotherapy as planned. Continue to apply sonafine, radiation healing lotion in treatment fields. Continue to apply hydrocortisone 1% cream prn itching   Eppie Gibson, M.D.

## 2015-11-10 NOTE — Progress Notes (Signed)
Isabella Cook presents for her 25th fraction of radiation to her Bilateral Breast. She denies pain. She has some mild fatigue. Her Bilateral Breasts are red. She reports itching above her Left Breast to the Clavicle area. She is using sonafine to her Bilateral Breasts and cortisone cream to the area of itching above her Left Breast.   BP (!) 142/82   Pulse 82   Temp 97.9 F (36.6 C)   Wt 173 lb 6.4 oz (78.7 kg)   SpO2 100% Comment: room air  BMI 30.23 kg/m    Wt Readings from Last 3 Encounters:  11/10/15 173 lb 6.4 oz (78.7 kg)  11/03/15 173 lb 6.4 oz (78.7 kg)  10/27/15 173 lb 9.6 oz (78.7 kg)

## 2015-11-11 ENCOUNTER — Ambulatory Visit
Admission: RE | Admit: 2015-11-11 | Discharge: 2015-11-11 | Disposition: A | Discharge status: Home / Self Care | Payer: Medicare Other | Source: Ambulatory Visit | Attending: Radiation Oncology | Admitting: Radiation Oncology

## 2015-11-11 ENCOUNTER — Other Ambulatory Visit (HOSPITAL_BASED_OUTPATIENT_CLINIC_OR_DEPARTMENT_OTHER): Payer: Medicare Other

## 2015-11-11 ENCOUNTER — Ambulatory Visit (HOSPITAL_BASED_OUTPATIENT_CLINIC_OR_DEPARTMENT_OTHER): Payer: Medicare Other

## 2015-11-11 VITALS — BP 159/68 | HR 88 | Temp 98.5°F | Resp 16

## 2015-11-11 DIAGNOSIS — C50511 Malignant neoplasm of lower-outer quadrant of right female breast: Secondary | ICD-10-CM

## 2015-11-11 DIAGNOSIS — Z5112 Encounter for antineoplastic immunotherapy: Secondary | ICD-10-CM

## 2015-11-11 DIAGNOSIS — Z510 Encounter for antineoplastic radiation therapy: Secondary | ICD-10-CM | POA: Diagnosis not present

## 2015-11-11 DIAGNOSIS — C50412 Malignant neoplasm of upper-outer quadrant of left female breast: Secondary | ICD-10-CM

## 2015-11-11 LAB — CBC WITH DIFFERENTIAL/PLATELET
BASO%: 0.5 % (ref 0.0–2.0)
BASOS ABS: 0 10*3/uL (ref 0.0–0.1)
EOS ABS: 0.2 10*3/uL (ref 0.0–0.5)
EOS%: 5 % (ref 0.0–7.0)
HCT: 39.3 % (ref 34.8–46.6)
HGB: 13 g/dL (ref 11.6–15.9)
LYMPH%: 14.1 % (ref 14.0–49.7)
MCH: 29.3 pg (ref 25.1–34.0)
MCHC: 33.1 g/dL (ref 31.5–36.0)
MCV: 88.5 fL (ref 79.5–101.0)
MONO#: 0.7 10*3/uL (ref 0.1–0.9)
MONO%: 16 % — AB (ref 0.0–14.0)
NEUT#: 2.7 10*3/uL (ref 1.5–6.5)
NEUT%: 64.4 % (ref 38.4–76.8)
Platelets: 170 10*3/uL (ref 145–400)
RBC: 4.44 10*6/uL (ref 3.70–5.45)
RDW: 14.2 % (ref 11.2–14.5)
WBC: 4.2 10*3/uL (ref 3.9–10.3)
lymph#: 0.6 10*3/uL — ABNORMAL LOW (ref 0.9–3.3)

## 2015-11-11 LAB — COMPREHENSIVE METABOLIC PANEL
ALK PHOS: 71 U/L (ref 40–150)
ALT: 14 U/L (ref 0–55)
AST: 17 U/L (ref 5–34)
Albumin: 3.8 g/dL (ref 3.5–5.0)
Anion Gap: 9 mEq/L (ref 3–11)
BUN: 14 mg/dL (ref 7.0–26.0)
CHLORIDE: 105 meq/L (ref 98–109)
CO2: 28 meq/L (ref 22–29)
Calcium: 10.4 mg/dL (ref 8.4–10.4)
Creatinine: 1 mg/dL (ref 0.6–1.1)
EGFR: 57 mL/min/{1.73_m2} — AB (ref >90)
GLUCOSE: 95 mg/dL (ref 70–140)
POTASSIUM: 4.2 meq/L (ref 3.5–5.1)
SODIUM: 142 meq/L (ref 136–145)
Total Bilirubin: 0.34 mg/dL (ref 0.20–1.20)
Total Protein: 7.7 g/dL (ref 6.4–8.3)

## 2015-11-11 MED ORDER — DIPHENHYDRAMINE HCL 50 MG/ML IJ SOLN
INTRAMUSCULAR | Status: AC
  Filled 2015-11-11: qty 1

## 2015-11-11 MED ORDER — SODIUM CHLORIDE 0.9 % IV SOLN
Freq: Once | INTRAVENOUS | Status: AC
  Administered 2015-11-11: 14:00:00 via INTRAVENOUS

## 2015-11-11 MED ORDER — ACETAMINOPHEN 325 MG PO TABS
ORAL_TABLET | ORAL | Status: AC
  Filled 2015-11-11: qty 2

## 2015-11-11 MED ORDER — SODIUM CHLORIDE 0.9% FLUSH
10.0000 mL | INTRAVENOUS | Status: DC | PRN
  Administered 2015-11-11: 10 mL
  Filled 2015-11-11: qty 10

## 2015-11-11 MED ORDER — ACETAMINOPHEN 325 MG PO TABS
650.0000 mg | ORAL_TABLET | Freq: Once | ORAL | Status: AC
  Administered 2015-11-11: 650 mg via ORAL

## 2015-11-11 MED ORDER — DIPHENHYDRAMINE HCL 50 MG/ML IJ SOLN
25.0000 mg | Freq: Once | INTRAMUSCULAR | Status: AC
Start: 2015-11-11 — End: 2015-11-11
  Administered 2015-11-11: 25 mg via INTRAVENOUS

## 2015-11-11 MED ORDER — HEPARIN SOD (PORK) LOCK FLUSH 100 UNIT/ML IV SOLN
500.0000 [IU] | Freq: Once | INTRAVENOUS | Status: AC | PRN
  Administered 2015-11-11: 500 [IU]
  Filled 2015-11-11: qty 5

## 2015-11-11 MED ORDER — SODIUM CHLORIDE 0.9 % IV SOLN
6.0000 mg/kg | Freq: Once | INTRAVENOUS | Status: AC
  Administered 2015-11-11: 483 mg via INTRAVENOUS
  Filled 2015-11-11: qty 23

## 2015-11-11 NOTE — Patient Instructions (Signed)
[facility] Discharge Instructions for Patients Receiving Chemotherapy  Today you received the following chemotherapy agents: Herceptin   To help prevent nausea and vomiting after your treatment, we encourage you to take your nausea medication as directed.    If you develop nausea and vomiting that is not controlled by your nausea medication, call the clinic.   BELOW ARE SYMPTOMS THAT SHOULD BE REPORTED IMMEDIATELY:  *FEVER GREATER THAN 100.5 F  *CHILLS WITH OR WITHOUT FEVER  NAUSEA AND VOMITING THAT IS NOT CONTROLLED WITH YOUR NAUSEA MEDICATION  *UNUSUAL SHORTNESS OF BREATH  *UNUSUAL BRUISING OR BLEEDING  TENDERNESS IN MOUTH AND THROAT WITH OR WITHOUT PRESENCE OF ULCERS  *URINARY PROBLEMS  *BOWEL PROBLEMS  UNUSUAL RASH Items with * indicate a potential emergency and should be followed up as soon as possible.  Feel free to call the clinic you have any questions or concerns. The clinic phone number is [phone].  Please show the CHEMO ALERT CARD at check-in to the Emergency Department and triage nurse.   

## 2015-11-12 ENCOUNTER — Ambulatory Visit
Admission: RE | Admit: 2015-11-12 | Discharge: 2015-11-12 | Disposition: A | Discharge status: Home / Self Care | Payer: Medicare Other | Source: Ambulatory Visit | Attending: Radiation Oncology | Admitting: Radiation Oncology

## 2015-11-12 DIAGNOSIS — Z510 Encounter for antineoplastic radiation therapy: Secondary | ICD-10-CM | POA: Diagnosis not present

## 2015-11-13 ENCOUNTER — Ambulatory Visit
Admission: RE | Admit: 2015-11-13 | Discharge: 2015-11-13 | Disposition: A | Discharge status: Home / Self Care | Payer: Medicare Other | Source: Ambulatory Visit | Attending: Radiation Oncology | Admitting: Radiation Oncology

## 2015-11-13 DIAGNOSIS — Z510 Encounter for antineoplastic radiation therapy: Secondary | ICD-10-CM | POA: Diagnosis not present

## 2015-11-14 ENCOUNTER — Ambulatory Visit
Admission: RE | Admit: 2015-11-14 | Discharge: 2015-11-14 | Disposition: A | Discharge status: Home / Self Care | Payer: Medicare Other | Source: Ambulatory Visit | Attending: Radiation Oncology | Admitting: Radiation Oncology

## 2015-11-14 DIAGNOSIS — Z510 Encounter for antineoplastic radiation therapy: Secondary | ICD-10-CM | POA: Diagnosis not present

## 2015-11-17 ENCOUNTER — Encounter: Payer: Self-pay | Admitting: Radiation Oncology

## 2015-11-17 ENCOUNTER — Telehealth: Payer: Self-pay | Admitting: *Deleted

## 2015-11-17 ENCOUNTER — Ambulatory Visit
Admission: RE | Admit: 2015-11-17 | Discharge: 2015-11-17 | Disposition: A | Discharge status: Home / Self Care | Payer: Medicare Other | Source: Ambulatory Visit | Attending: Radiation Oncology | Admitting: Radiation Oncology

## 2015-11-17 VITALS — BP 161/81 | HR 89 | Temp 97.6°F | Ht 63.5 in | Wt 171.4 lb

## 2015-11-17 DIAGNOSIS — Z170 Estrogen receptor positive status [ER+]: Principal | ICD-10-CM

## 2015-11-17 DIAGNOSIS — Z923 Personal history of irradiation: Secondary | ICD-10-CM | POA: Insufficient documentation

## 2015-11-17 DIAGNOSIS — C50412 Malignant neoplasm of upper-outer quadrant of left female breast: Secondary | ICD-10-CM | POA: Insufficient documentation

## 2015-11-17 DIAGNOSIS — C50511 Malignant neoplasm of lower-outer quadrant of right female breast: Secondary | ICD-10-CM | POA: Insufficient documentation

## 2015-11-17 DIAGNOSIS — Z510 Encounter for antineoplastic radiation therapy: Secondary | ICD-10-CM | POA: Diagnosis not present

## 2015-11-17 MED ORDER — SONAFINE EX EMUL
1.0000 "application " | Freq: Once | CUTANEOUS | Status: AC
  Administered 2015-11-17: 1 via TOPICAL

## 2015-11-17 NOTE — Addendum Note (Signed)
Encounter addended by: Malena Edman, RN on: 11/17/2015  2:51 PM<BR>    Actions taken: MAR administration accepted

## 2015-11-17 NOTE — Addendum Note (Signed)
Encounter addended by: Ernst Spell, RN on: 11/17/2015  1:12 PM<BR>    Actions taken: Order list changed, Diagnosis association updated

## 2015-11-17 NOTE — Telephone Encounter (Signed)
  Oncology Nurse Navigator Documentation  Navigator Location: [facility] (11/17/15 1300)   )Navigator Encounter Type: Treatment (11/17/15 1300)                     Patient Visit Type: [id number] (11/17/15 1300) Treatment Phase: Final Radiation Tx (11/17/15 1300)                            Time Spent with Patient: 15 (11/17/15 1300)

## 2015-11-17 NOTE — Progress Notes (Signed)
   Weekly Management Note:  Outpatient    ICD-9-CM ICD-10-CM   1. Malignant neoplasm of lower-outer quadrant of right breast of female, estrogen receptor positive ([place]) 174.5 C50.511    V86.0 Z17.0   2. Malignant neoplasm of upper-outer quadrant of left breast in female, estrogen receptor positive ([facility]) 174.4 C50.412    V86.0 Z17.0     Current Dose:  60 Gy  Projected Dose: 50 Gy to right breast, 60 Gy to left breast with regional nodal treatment on left as well.  Narrative:  The patient presents for routine under treatment assessment.  CBCT/MVCT images/Port film x-rays were reviewed.  The chart was checked. Doing well.  Tightness in tendon in left upper arm. Slight drainage, superficial, right axilla Skin healing at left SCV region  Physical Findings:  height is 5' 3.5" (1.613 m) and weight is 171 lb 6.4 oz (77.7 kg). Her temperature is 97.6 F (36.4 C). Her blood pressure is 161/81 (abnormal) and her pulse is 89. Her oxygen saturation is 100%.   Wt Readings from Last 3 Encounters:  11/17/15 171 lb 6.4 oz (77.7 kg)  11/10/15 173 lb 6.4 oz (78.7 kg)  11/03/15 173 lb 6.4 oz (78.7 kg)   Bright erythema over bilateral breasts; slight drainage, superficial, right axilla Skin healing at left SCV region  Impression:  The patient tolerated radiotherapy.  Plan:  F/u in 29mo. Continue to apply sonafine, radiation healing lotion in treatment fields. Continue to apply hydrocortisone 1% cream prn itching. Neosporin to moist regions.  Recommended PT for tendon tightness but she would rather do exercises on her own. I taught her one to try.    Eppie Gibson, M.D.

## 2015-11-17 NOTE — Progress Notes (Signed)
Isabella Cook is here for her last fraction of radiation to her Right and Left Breast. She denies pain. She denies fatigue. Her Right and Left Chest are red. She reports itching to the upper middle area. She has redness to her Left Clavicle area. She has a small area of peeling noted under her Left Breast laterally. She has been using neosporin to the Left Clavicle area and sonafine to all other areas of her Bilateral areas. She was provided with an another tube of sonafine today. She mentions to me that when she lifts her Left arm up, she feels "pulling in her tendon" and she is concerned about that. She was given a follow up appointment today for one month.  BP (!) 161/81   Pulse 89   Temp 97.6 F (36.4 C)   Ht 5' 3.5" (1.613 m)   Wt 171 lb 6.4 oz (77.7 kg)   SpO2 100% Comment: room air  BMI 29.89 kg/m    Wt Readings from Last 3 Encounters:  11/17/15 171 lb 6.4 oz (77.7 kg)  11/10/15 173 lb 6.4 oz (78.7 kg)  11/03/15 173 lb 6.4 oz (78.7 kg)

## 2015-12-02 ENCOUNTER — Ambulatory Visit: Payer: Medicare Other

## 2015-12-02 ENCOUNTER — Ambulatory Visit (HOSPITAL_BASED_OUTPATIENT_CLINIC_OR_DEPARTMENT_OTHER): Payer: Medicare Other

## 2015-12-02 ENCOUNTER — Other Ambulatory Visit (HOSPITAL_BASED_OUTPATIENT_CLINIC_OR_DEPARTMENT_OTHER): Payer: Medicare Other

## 2015-12-02 VITALS — BP 148/80 | HR 79 | Temp 98.3°F | Resp 18

## 2015-12-02 DIAGNOSIS — Z95828 Presence of other vascular implants and grafts: Secondary | ICD-10-CM

## 2015-12-02 DIAGNOSIS — C50511 Malignant neoplasm of lower-outer quadrant of right female breast: Secondary | ICD-10-CM

## 2015-12-02 DIAGNOSIS — C50412 Malignant neoplasm of upper-outer quadrant of left female breast: Secondary | ICD-10-CM | POA: Diagnosis present

## 2015-12-02 DIAGNOSIS — Z5112 Encounter for antineoplastic immunotherapy: Secondary | ICD-10-CM | POA: Diagnosis present

## 2015-12-02 DIAGNOSIS — Z170 Estrogen receptor positive status [ER+]: Secondary | ICD-10-CM

## 2015-12-02 LAB — CBC WITH DIFFERENTIAL/PLATELET
BASO%: 0.5 % (ref 0.0–2.0)
BASOS ABS: 0 10*3/uL (ref 0.0–0.1)
EOS ABS: 0.2 10*3/uL (ref 0.0–0.5)
EOS%: 4.7 % (ref 0.0–7.0)
HCT: 35.2 % (ref 34.8–46.6)
HGB: 11.7 g/dL (ref 11.6–15.9)
LYMPH%: 16.7 % (ref 14.0–49.7)
MCH: 29.3 pg (ref 25.1–34.0)
MCHC: 33.2 g/dL (ref 31.5–36.0)
MCV: 88 fL (ref 79.5–101.0)
MONO#: 0.6 10*3/uL (ref 0.1–0.9)
MONO%: 14.6 % — ABNORMAL HIGH (ref 0.0–14.0)
NEUT%: 63.5 % (ref 38.4–76.8)
NEUTROS ABS: 2.4 10*3/uL (ref 1.5–6.5)
PLATELETS: 163 10*3/uL (ref 145–400)
RBC: 4 10*6/uL (ref 3.70–5.45)
RDW: 14.3 % (ref 11.2–14.5)
WBC: 3.8 10*3/uL — AB (ref 3.9–10.3)
lymph#: 0.6 10*3/uL — ABNORMAL LOW (ref 0.9–3.3)

## 2015-12-02 LAB — COMPREHENSIVE METABOLIC PANEL
ALBUMIN: 3.5 g/dL (ref 3.5–5.0)
ALK PHOS: 76 U/L (ref 40–150)
ALT: 18 U/L (ref 0–55)
ANION GAP: 8 meq/L (ref 3–11)
AST: 18 U/L (ref 5–34)
BILIRUBIN TOTAL: 0.35 mg/dL (ref 0.20–1.20)
BUN: 12.7 mg/dL (ref 7.0–26.0)
CO2: 26 mEq/L (ref 22–29)
Calcium: 10.1 mg/dL (ref 8.4–10.4)
Chloride: 107 mEq/L (ref 98–109)
Creatinine: 0.9 mg/dL (ref 0.6–1.1)
EGFR: 66 mL/min/{1.73_m2} — AB (ref >90)
Glucose: 117 mg/dl (ref 70–140)
POTASSIUM: 4 meq/L (ref 3.5–5.1)
Sodium: 141 mEq/L (ref 136–145)
TOTAL PROTEIN: 7.4 g/dL (ref 6.4–8.3)

## 2015-12-02 MED ORDER — DIPHENHYDRAMINE HCL 50 MG/ML IJ SOLN
INTRAMUSCULAR | Status: AC
  Filled 2015-12-02: qty 1

## 2015-12-02 MED ORDER — ACETAMINOPHEN 325 MG PO TABS
ORAL_TABLET | ORAL | Status: AC
  Filled 2015-12-02: qty 2

## 2015-12-02 MED ORDER — SODIUM CHLORIDE 0.9 % IV SOLN
Freq: Once | INTRAVENOUS | Status: AC
  Administered 2015-12-02: 13:00:00 via INTRAVENOUS

## 2015-12-02 MED ORDER — SODIUM CHLORIDE 0.9% FLUSH
10.0000 mL | INTRAVENOUS | Status: DC | PRN
  Administered 2015-12-02: 10 mL
  Filled 2015-12-02: qty 10

## 2015-12-02 MED ORDER — SODIUM CHLORIDE 0.9 % IJ SOLN
10.0000 mL | INTRAMUSCULAR | Status: DC | PRN
  Administered 2015-12-02: 10 mL via INTRAVENOUS
  Filled 2015-12-02: qty 10

## 2015-12-02 MED ORDER — ACETAMINOPHEN 325 MG PO TABS
650.0000 mg | ORAL_TABLET | Freq: Once | ORAL | Status: AC
  Administered 2015-12-02: 650 mg via ORAL

## 2015-12-02 MED ORDER — HEPARIN SOD (PORK) LOCK FLUSH 100 UNIT/ML IV SOLN
500.0000 [IU] | Freq: Once | INTRAVENOUS | Status: AC | PRN
  Administered 2015-12-02: 500 [IU]
  Filled 2015-12-02: qty 5

## 2015-12-02 MED ORDER — DIPHENHYDRAMINE HCL 50 MG/ML IJ SOLN
25.0000 mg | Freq: Once | INTRAMUSCULAR | Status: AC
Start: 2015-12-02 — End: 2015-12-02
  Administered 2015-12-02: 25 mg via INTRAVENOUS

## 2015-12-02 MED ORDER — TRASTUZUMAB CHEMO 150 MG IV SOLR
6.0000 mg/kg | Freq: Once | INTRAVENOUS | Status: AC
  Administered 2015-12-02: 483 mg via INTRAVENOUS
  Filled 2015-12-02: qty 23

## 2015-12-02 NOTE — Patient Instructions (Signed)
[facility] Discharge Instructions for Patients Receiving Chemotherapy  Today you received the following chemotherapy agents: Herceptin   To help prevent nausea and vomiting after your treatment, we encourage you to take your nausea medication as directed.    If you develop nausea and vomiting that is not controlled by your nausea medication, call the clinic.   BELOW ARE SYMPTOMS THAT SHOULD BE REPORTED IMMEDIATELY:  *FEVER GREATER THAN 100.5 F  *CHILLS WITH OR WITHOUT FEVER  NAUSEA AND VOMITING THAT IS NOT CONTROLLED WITH YOUR NAUSEA MEDICATION  *UNUSUAL SHORTNESS OF BREATH  *UNUSUAL BRUISING OR BLEEDING  TENDERNESS IN MOUTH AND THROAT WITH OR WITHOUT PRESENCE OF ULCERS  *URINARY PROBLEMS  *BOWEL PROBLEMS  UNUSUAL RASH Items with * indicate a potential emergency and should be followed up as soon as possible.  Feel free to call the clinic you have any questions or concerns. The clinic phone number is [phone].  Please show the CHEMO ALERT CARD at check-in to the Emergency Department and triage nurse.   

## 2015-12-12 ENCOUNTER — Ambulatory Visit (HOSPITAL_COMMUNITY)
Admission: RE | Admit: 2015-12-12 | Discharge: 2015-12-12 | Disposition: A | Discharge status: Home / Self Care | Payer: Medicare Other | Source: Ambulatory Visit | Attending: Oncology | Admitting: Oncology

## 2015-12-12 ENCOUNTER — Telehealth: Payer: Self-pay

## 2015-12-12 ENCOUNTER — Ambulatory Visit (HOSPITAL_BASED_OUTPATIENT_CLINIC_OR_DEPARTMENT_OTHER): Payer: Medicare Other

## 2015-12-12 DIAGNOSIS — C50412 Malignant neoplasm of upper-outer quadrant of left female breast: Secondary | ICD-10-CM | POA: Diagnosis not present

## 2015-12-12 DIAGNOSIS — C50511 Malignant neoplasm of lower-outer quadrant of right female breast: Secondary | ICD-10-CM

## 2015-12-12 DIAGNOSIS — R300 Dysuria: Secondary | ICD-10-CM | POA: Diagnosis not present

## 2015-12-12 DIAGNOSIS — R911 Solitary pulmonary nodule: Secondary | ICD-10-CM | POA: Insufficient documentation

## 2015-12-12 LAB — URINALYSIS, MICROSCOPIC - CHCC
BILIRUBIN (URINE): NEGATIVE
BLOOD: NEGATIVE
Glucose: NEGATIVE mg/dL
KETONES: NEGATIVE mg/dL
NITRITE: NEGATIVE
PH: 6 (ref 4.6–8.0)
Protein: NEGATIVE mg/dL
SPECIFIC GRAVITY, URINE: 1.005 (ref 1.003–1.035)
Urobilinogen, UR: 0.2 mg/dL (ref 0.2–1)

## 2015-12-12 LAB — CBC WITH DIFFERENTIAL/PLATELET
BASO%: 0.5 % (ref 0.0–2.0)
Basophils Absolute: 0 10*3/uL (ref 0.0–0.1)
EOS ABS: 0.1 10*3/uL (ref 0.0–0.5)
EOS%: 3.6 % (ref 0.0–7.0)
HEMATOCRIT: 33.7 % — AB (ref 34.8–46.6)
HEMOGLOBIN: 11.2 g/dL — AB (ref 11.6–15.9)
LYMPH#: 0.8 10*3/uL — AB (ref 0.9–3.3)
LYMPH%: 21.4 % (ref 14.0–49.7)
MCH: 28.9 pg (ref 25.1–34.0)
MCHC: 33.2 g/dL (ref 31.5–36.0)
MCV: 87.1 fL (ref 79.5–101.0)
MONO#: 0.6 10*3/uL (ref 0.1–0.9)
MONO%: 15.4 % — ABNORMAL HIGH (ref 0.0–14.0)
NEUT#: 2.2 10*3/uL (ref 1.5–6.5)
NEUT%: 59.1 % (ref 38.4–76.8)
PLATELETS: 195 10*3/uL (ref 145–400)
RBC: 3.87 10*6/uL (ref 3.70–5.45)
RDW: 13.8 % (ref 11.2–14.5)
WBC: 3.6 10*3/uL — ABNORMAL LOW (ref 3.9–10.3)
nRBC: 0 % (ref 0–0)

## 2015-12-12 MED ORDER — IOPAMIDOL (ISOVUE-300) INJECTION 61%
75.0000 mL | Freq: Once | INTRAVENOUS | Status: AC | PRN
  Administered 2015-12-12: 75 mL via INTRAVENOUS

## 2015-12-12 NOTE — Telephone Encounter (Signed)
Pt called with sx of UTI-burning and frequency, she will be at [facility] for scan today. Per Dr Virgie Dad OV note a ua/c&s was ordered and lab appt made for when she finished her CT scan.

## 2015-12-14 LAB — URINE CULTURE

## 2015-12-15 ENCOUNTER — Other Ambulatory Visit: Payer: Self-pay | Admitting: Emergency Medicine

## 2015-12-15 ENCOUNTER — Other Ambulatory Visit: Payer: Self-pay | Admitting: Oncology

## 2015-12-15 MED ORDER — NITROFURANTOIN MONOHYD MACRO 100 MG PO CAPS: 100.0000 mg | ORAL_CAPSULE | Freq: Two times a day (BID) | ORAL | 0 refills | Status: DC

## 2015-12-15 NOTE — Telephone Encounter (Signed)
Spoke with patient; advised her per Dr [name] that her recent CT "looked fine". Informed patient that she does have a URI though and sent a prescription to her pharmacy for that. Instructed patient to call for any questions or concerns and to f/u as scheduled on 12/19. Patient verbalized understanding.

## 2015-12-16 ENCOUNTER — Encounter: Payer: Self-pay | Admitting: Radiation Oncology

## 2015-12-19 ENCOUNTER — Ambulatory Visit
Admission: RE | Admit: 2015-12-19 | Discharge: 2015-12-19 | Disposition: A | Discharge status: Home / Self Care | Payer: Medicare Other | Source: Ambulatory Visit | Attending: Radiation Oncology | Admitting: Radiation Oncology

## 2015-12-19 ENCOUNTER — Encounter: Payer: Self-pay | Admitting: Radiation Oncology

## 2015-12-19 VITALS — BP 127/78 | HR 100 | Temp 98.2°F | Ht 63.5 in | Wt 168.0 lb

## 2015-12-19 DIAGNOSIS — C50511 Malignant neoplasm of lower-outer quadrant of right female breast: Secondary | ICD-10-CM | POA: Insufficient documentation

## 2015-12-19 DIAGNOSIS — Z5189 Encounter for other specified aftercare: Secondary | ICD-10-CM | POA: Insufficient documentation

## 2015-12-19 DIAGNOSIS — Z79899 Other long term (current) drug therapy: Secondary | ICD-10-CM | POA: Insufficient documentation

## 2015-12-19 DIAGNOSIS — Z170 Estrogen receptor positive status [ER+]: Secondary | ICD-10-CM | POA: Insufficient documentation

## 2015-12-19 DIAGNOSIS — Z923 Personal history of irradiation: Secondary | ICD-10-CM | POA: Insufficient documentation

## 2015-12-19 DIAGNOSIS — C50412 Malignant neoplasm of upper-outer quadrant of left female breast: Secondary | ICD-10-CM | POA: Diagnosis not present

## 2015-12-19 HISTORY — DX: Personal history of irradiation: Z92.3

## 2015-12-19 NOTE — Progress Notes (Signed)
  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 Malignant neoplasm of lower-outer quadrant of right breast of female, estrogen receptor positive ([facility]) 174.5 C50.511    V86.0 Z17.0   2. Malignant neoplasm of upper-outer quadrant of left breast in female, estrogen receptor positive ([place]) 174.4 C50.412    V86.0 Z17.0        Indication for treatment:  Curative       Radiation treatment dates:   10/07/2015 to 11/17/2015  Site/dose:    1. The Right breast was treated to 50 Gy in 25 fractions at 2 Gy per fraction. 2. The Left breast (4-field) was treated to 50 Gy in 25 fractions at 2 Gy per fraction. 3. The Left breast was boosted to 10 Gy in 5 fractions at 2 Gy per fraction.   Beams/energy:    1. 3D // 10X, 6X 2. 3D // 10X, 15X, 6X 3. Isodose plan // 15X, 6X  Narrative: The patient tolerated radiation treatment relatively well.  She experienced tightness in the tendon in the left upper arm. She developed bright erythema over bilateral breasts with slight drainage, superficial, right axilla. Skin healing at left SCV region.   Plan: The patient has completed radiation treatment. The patient will return to radiation oncology clinic for routine followup in one month. I recommended PT for tendon tightness but she would rather do exercises on her own - I taught her one to try. I advised them to call or return sooner if they have any questions or concerns related to their recovery or treatment.  -----------------------------------  Eppie Gibson, MD   This document serves as a record of services personally performed by Eppie Gibson, MD. It was created on her behalf by Arlyce Harman, a trained medical scribe. The creation of  this record is based on the scribe's personal observations and the provider's statements to them. This document has been checked and approved by the attending provider.

## 2015-12-19 NOTE — Progress Notes (Signed)
Isabella Cook presents for follow up of radiation completed 11/17/15 to her Right and Left Breast. She denies pain. She does have fatigue when more active. She does have some hyperpigmentation present to her Bilateral Breasts. She is using her normal lotion at home. She is aware to try a vitamin E cream. She continues Herceptin every 3 weeks until mid April. Dr. [name] plans to prescribe Tamoxifen at her next visit with him.   BP 127/78   Pulse 100   Temp 98.2 F (36.8 C)   Ht 5' 3.5" (1.613 m)   Wt 168 lb (76.2 kg)   SpO2 99% Comment: room air  BMI 29.29 kg/m    Wt Readings from Last 3 Encounters:  12/19/15 168 lb (76.2 kg)  11/17/15 171 lb 6.4 oz (77.7 kg)  11/10/15 173 lb 6.4 oz (78.7 kg)

## 2015-12-19 NOTE — Progress Notes (Signed)
Radiation Oncology         [phone] ________________________________  Name: Isabella Cook MRN: [id number]  Date: 12/19/2015  DOB: 01-Jul-1940  Follow-Up Visit Note  Outpatient  CC: [name], FNP  Cornett, [name], MD  Diagnosis and Prior Radiotherapy:    ICD-9-CM ICD-10-CM   1. Malignant neoplasm of lower-outer quadrant of right breast of female, estrogen receptor positive ([place]) 174.5 C50.511    V86.0 Z17.0   2. Malignant neoplasm of upper-outer quadrant of left breast in female, estrogen receptor positive ([place]) 174.4 C50.412    V86.0 Z17.0     CHIEF COMPLAINT: Here for follow-up and surveillance of bilateral breast cancer  Narrative:  The patient returns today for routine follow-up of radiation completed 11/17/2015 to her right and left breast. She denies pain. She does have fatigue when more active. She does have some hyperpigmentation present to her bilateral breasts. She is using her normal Avon lotion at home. She is aware to try a Vitamin E cream. She continues Herceptin every 3 weeks until April. Dr. [name] plans to prescribe Tamoxifen at her next visit with him. Her next scheduled visit with Dr. [name] is 12/23/2015.                               ALLERGIES:  is allergic to dairy aid [lactase]; eggs or egg-derived products; gelatin (bovine) [beef extract]; gluten meal; lambs quarters; milk-related compounds; pork allergy; wheat bran; whey; yeast; soy allergy; and almond oil.  Meds: Current Outpatient Prescriptions  Medication Sig Dispense Refill  . calcium carbonate (TUMS - DOSED IN MG ELEMENTAL CALCIUM) 500 MG chewable tablet Chew 500 mg by mouth daily as needed for indigestion or heartburn.     . diphenhydrAMINE (SOMINEX) 25 MG tablet Take 25 mg by mouth at bedtime as needed. Reported on 05/20/2015    . famotidine (PEPCID) 20 MG tablet Take 20 mg by mouth daily.     Marland Kitchen lidocaine-prilocaine (EMLA) cream Apply to affected area once    . liothyronine (CYTOMEL) 5  MCG tablet Take 5 mcg by mouth daily. Patient reports she takes T3 because of a gelatin component that the Cytomel had    . loratadine (CLARITIN) 10 MG tablet Take 10 mg by mouth daily.    . Magnesium Citrate 100 MG TABS Take 200 mg by mouth daily.    . Methylcellulose, Laxative, (CITRUCEL) 500 MG TABS Take 2,000 mg by mouth 2 (two) times daily.    . naproxen sodium (ANAPROX) 220 MG tablet Take 440 mg by mouth 3 (three) times daily with meals as needed.     . nitrofurantoin, macrocrystal-monohydrate, (MACROBID) 100 MG capsule Take 1 capsule (100 mg total) by mouth 2 (two) times daily. 14 capsule 0  . Polyethyl Glycol-Propyl Glycol (SYSTANE ULTRA) 0.4-0.3 % SOLN Place 1 drop into both eyes daily as needed (dry eyes).    . Probiotic Product (ALIGN PO) Take 1 tablet by mouth daily.     Marland Kitchen VITAMIN D-VITAMIN K PO Take 1 capsule by mouth daily. Reported on 04/15/2015    . ondansetron (ZOFRAN) 4 MG tablet Take 4 mg by mouth every 8 (eight) hours as needed for nausea or vomiting.    . promethazine (PHENERGAN) 12.5 MG tablet Take 12.5 mg by mouth every 6 (six) hours as needed for nausea or vomiting.     No current facility-administered medications for this encounter.     Physical Findings:  height is 5'  3.5" (1.613 m) and weight is 168 lb (76.2 kg). Her temperature is 98.2 F (36.8 C). Her blood pressure is 127/78 and her pulse is 100. Her oxygen saturation is 99%. .    General: Alert and oriented, in no acute distress HEENT: Head is normocephalic.  Neurologic: Speech is fluent. Coordination is intact. Psychiatric: Judgment and insight are intact. Affect is appropriate. Breast: Very faint residual hyperpigmentation over her breasts. Skin is slightly dry.  Lab Findings: Lab Results  Component Value Date   WBC 3.6 (L) 12/12/2015   HGB 11.2 (L) 12/12/2015   HCT 33.7 (L) 12/12/2015   MCV 87.1 12/12/2015   PLT 195 12/12/2015    Radiographic Findings: Ct Chest W Contrast  Result Date:  12/12/2015 CLINICAL DATA:  7 mm left lower lobe nodule seen on breast MRI EXAM: CT CHEST WITH CONTRAST TECHNIQUE: Multidetector CT imaging of the chest was performed during intravenous contrast administration. CONTRAST:  98mL ISOVUE-300 IOPAMIDOL (ISOVUE-300) INJECTION 61% COMPARISON:  Breast MRI 07/10/2015. FINDINGS: Cardiovascular: The heart size is normal. No pericardial effusion. Coronary artery calcification is noted. No thoracic aortic aneurysm. Right Port-A-Cath tip is positioned in the mid SVC. Mediastinum/Nodes: No mediastinal lymphadenopathy. There is no hilar lymphadenopathy. The esophagus has normal imaging features. Small lymph nodes are seen in the axillary regions bilaterally. Surgical clips are noted in the right axilla. 2.8 x 2.7 cm fluid density lesion in the left axilla is likely a seroma or chronic hematoma from prior lymphadenectomy. Lungs/Pleura: No focal airspace consolidation. No pulmonary edema or pleural effusion. As noted on the previous MRI, any tiny pulmonary nodules identified in the left lung base, in the lingula. This measures 6 mm by CT imaging today suggesting no change since the MRI 5 months ago. No other pulmonary nodule or mass. Upper Abdomen: No focal abnormality identified within the visualized portions of the liver. Mild prominence of the intra and extrahepatic bile ducts likely secondary to prior cholecystectomy. Tiny hiatal hernia noted. Musculoskeletal: Bone windows reveal no worrisome lytic or sclerotic osseous lesions. IMPRESSION: 1. 6 mm nodule identified inferior lingula, corresponding to the nodule seen on the previous MRI. This is been stable on the 5 month interval since the prior exam. Non-contrast chest CT at 6-12 months is recommended. If the nodule is stable at time of repeat CT, then future CT at 18-24 months (from today's scan) is considered optional for low-risk patients, but is recommended for high-risk patients. This recommendation follows the consensus  statement: Guidelines for Management of Incidental Pulmonary Nodules Detected on CT Images: From the Fleischner Society 2017; Radiology 2017; 284:228-243. Electronically Signed   By: Misty Stanley M.D.   On: 12/12/2015 14:54    Impression/Plan:  She is healing well.   She is wondering about MRI surveillance due to "missed" cancer on prior mammography. I encouraged her to call radiology to see if Breast MRI should be performed with her annual screening mammograms.   I encouraged her to begin using Vitamin E lotion in the mornings and evenings for the next few months to avoid dry skin and promote healing.  I encouraged her to continue with yearly mammography and followup with medical oncology. I will see her back on an as-needed basis. I have encouraged her to call if she has any issues or concerns in the future. I wished her the very best.  ____________________________________   Eppie Gibson, MD   This document serves as a record of services personally performed by Eppie Gibson, MD. It was created  on her behalf by Arlyce Harman, a trained medical scribe. The creation of this record is based on the scribe's personal observations and the provider's statements to them. This document has been checked and approved by the attending provider.

## 2015-12-23 ENCOUNTER — Other Ambulatory Visit (HOSPITAL_BASED_OUTPATIENT_CLINIC_OR_DEPARTMENT_OTHER): Payer: Medicare Other

## 2015-12-23 ENCOUNTER — Ambulatory Visit (HOSPITAL_BASED_OUTPATIENT_CLINIC_OR_DEPARTMENT_OTHER): Payer: Medicare Other

## 2015-12-23 ENCOUNTER — Ambulatory Visit (HOSPITAL_BASED_OUTPATIENT_CLINIC_OR_DEPARTMENT_OTHER): Payer: Medicare Other | Admitting: Oncology

## 2015-12-23 VITALS — BP 143/78 | HR 94 | Temp 97.7°F | Resp 18 | Ht 63.5 in | Wt 168.3 lb

## 2015-12-23 DIAGNOSIS — C50412 Malignant neoplasm of upper-outer quadrant of left female breast: Secondary | ICD-10-CM

## 2015-12-23 DIAGNOSIS — C50511 Malignant neoplasm of lower-outer quadrant of right female breast: Secondary | ICD-10-CM

## 2015-12-23 DIAGNOSIS — Z170 Estrogen receptor positive status [ER+]: Secondary | ICD-10-CM | POA: Diagnosis not present

## 2015-12-23 DIAGNOSIS — N390 Urinary tract infection, site not specified: Secondary | ICD-10-CM | POA: Diagnosis not present

## 2015-12-23 DIAGNOSIS — Z5111 Encounter for antineoplastic chemotherapy: Secondary | ICD-10-CM

## 2015-12-23 DIAGNOSIS — M8580 Other specified disorders of bone density and structure, unspecified site: Secondary | ICD-10-CM

## 2015-12-23 DIAGNOSIS — C50211 Malignant neoplasm of upper-inner quadrant of right female breast: Secondary | ICD-10-CM

## 2015-12-23 LAB — COMPREHENSIVE METABOLIC PANEL
ALBUMIN: 3.5 g/dL (ref 3.5–5.0)
ALK PHOS: 80 U/L (ref 40–150)
ALT: 19 U/L (ref 0–55)
AST: 16 U/L (ref 5–34)
Anion Gap: 9 mEq/L (ref 3–11)
BUN: 13.7 mg/dL (ref 7.0–26.0)
CALCIUM: 10.5 mg/dL — AB (ref 8.4–10.4)
CHLORIDE: 107 meq/L (ref 98–109)
CO2: 25 mEq/L (ref 22–29)
Creatinine: 1.1 mg/dL (ref 0.6–1.1)
EGFR: 52 mL/min/{1.73_m2} — AB (ref >90)
Glucose: 150 mg/dl — ABNORMAL HIGH (ref 70–140)
POTASSIUM: 4.3 meq/L (ref 3.5–5.1)
SODIUM: 141 meq/L (ref 136–145)
Total Bilirubin: 0.31 mg/dL (ref 0.20–1.20)
Total Protein: 7.4 g/dL (ref 6.4–8.3)

## 2015-12-23 LAB — CBC WITH DIFFERENTIAL/PLATELET
BASO%: 0.5 % (ref 0.0–2.0)
BASOS ABS: 0 10*3/uL (ref 0.0–0.1)
EOS ABS: 0.2 10*3/uL (ref 0.0–0.5)
EOS%: 5.6 % (ref 0.0–7.0)
HEMATOCRIT: 35.8 % (ref 34.8–46.6)
HEMOGLOBIN: 11.6 g/dL (ref 11.6–15.9)
LYMPH%: 19 % (ref 14.0–49.7)
MCH: 28.6 pg (ref 25.1–34.0)
MCHC: 32.4 g/dL (ref 31.5–36.0)
MCV: 88.4 fL (ref 79.5–101.0)
MONO#: 0.4 10*3/uL (ref 0.1–0.9)
MONO%: 10.1 % (ref 0.0–14.0)
NEUT#: 2.5 10*3/uL (ref 1.5–6.5)
NEUT%: 64.8 % (ref 38.4–76.8)
Platelets: 173 10*3/uL (ref 145–400)
RBC: 4.05 10*6/uL (ref 3.70–5.45)
RDW: 13.8 % (ref 11.2–14.5)
WBC: 3.8 10*3/uL — ABNORMAL LOW (ref 3.9–10.3)
lymph#: 0.7 10*3/uL — ABNORMAL LOW (ref 0.9–3.3)

## 2015-12-23 MED ORDER — DIPHENHYDRAMINE HCL 50 MG/ML IJ SOLN
INTRAMUSCULAR | Status: AC
  Filled 2015-12-23: qty 1

## 2015-12-23 MED ORDER — TAMOXIFEN CITRATE 20 MG PO TABS: 20.0000 mg | ORAL_TABLET | Freq: Every day | ORAL | 12 refills | Status: AC

## 2015-12-23 MED ORDER — ACETAMINOPHEN 325 MG PO TABS
650.0000 mg | ORAL_TABLET | Freq: Once | ORAL | Status: AC
  Administered 2015-12-23: 650 mg via ORAL

## 2015-12-23 MED ORDER — SODIUM CHLORIDE 0.9% FLUSH
10.0000 mL | INTRAVENOUS | Status: DC | PRN
  Administered 2015-12-23: 10 mL
  Filled 2015-12-23: qty 10

## 2015-12-23 MED ORDER — SODIUM CHLORIDE 0.9 % IV SOLN
Freq: Once | INTRAVENOUS | Status: AC
  Administered 2015-12-23: 12:00:00 via INTRAVENOUS

## 2015-12-23 MED ORDER — DIPHENHYDRAMINE HCL 50 MG/ML IJ SOLN
25.0000 mg | Freq: Once | INTRAMUSCULAR | Status: AC
  Administered 2015-12-23: 25 mg via INTRAVENOUS

## 2015-12-23 MED ORDER — HEPARIN SOD (PORK) LOCK FLUSH 100 UNIT/ML IV SOLN
500.0000 [IU] | Freq: Once | INTRAVENOUS | Status: AC | PRN
  Administered 2015-12-23: 500 [IU]
  Filled 2015-12-23: qty 5

## 2015-12-23 MED ORDER — SODIUM CHLORIDE 0.9 % IV SOLN
6.0000 mg/kg | Freq: Once | INTRAVENOUS | Status: AC
  Administered 2015-12-23: 483 mg via INTRAVENOUS
  Filled 2015-12-23: qty 23

## 2015-12-23 MED ORDER — ACETAMINOPHEN 325 MG PO TABS
ORAL_TABLET | ORAL | Status: AC
  Filled 2015-12-23: qty 2

## 2015-12-23 NOTE — Patient Instructions (Signed)
[facility] Discharge Instructions for Patients Receiving Chemotherapy  Today you received the following chemotherapy agents:  Herceptin  To help prevent nausea and vomiting after your treatment, we encourage you to take your nausea medication as prescribed.   If you develop nausea and vomiting that is not controlled by your nausea medication, call the clinic.   BELOW ARE SYMPTOMS THAT SHOULD BE REPORTED IMMEDIATELY:  *FEVER GREATER THAN 100.5 F  *CHILLS WITH OR WITHOUT FEVER  NAUSEA AND VOMITING THAT IS NOT CONTROLLED WITH YOUR NAUSEA MEDICATION  *UNUSUAL SHORTNESS OF BREATH  *UNUSUAL BRUISING OR BLEEDING  TENDERNESS IN MOUTH AND THROAT WITH OR WITHOUT PRESENCE OF ULCERS  *URINARY PROBLEMS  *BOWEL PROBLEMS  UNUSUAL RASH Items with * indicate a potential emergency and should be followed up as soon as possible.  Feel free to call the clinic you have any questions or concerns. The clinic phone number is [phone].  Please show the CHEMO ALERT CARD at check-in to the Emergency Department and triage nurse.   

## 2015-12-23 NOTE — Progress Notes (Signed)
[place]  [phone]) [phone] [phone]   ID: Isabella Cook DOB: 1940/08/27  MR#: [id number]  ZYS#:[id number]  Patient Care Team: [name], FNP as PCP - General (Family Medicine) [name], MD as Consulting Physician (Oncology) [name], MD as Consulting Physician (General Surgery) [name], NP as Nurse Practitioner (Hematology and Oncology) [name], MD as Consulting Physician (Oncology) Thea Silversmith, MD as Consulting Physician (Radiation Oncology) [name], MD as Consulting Physician (Neurosurgery) [name], MD as Referring Physician (Otolaryngology) [name], MD as Consulting Physician (Cardiology) PCP: [name], FNP OTHER MD:  CHIEF COMPLAINT: HER-2 positive breast cancer, bilateral beast cancers  CURRENT TREATMENT: Tamoxifen, trastuzumab  BREAST CANCER HISTORY:   From the original intake note:  [name] had routine screening mammography at [facility] suggesting an area of indeterminate microcalcifications in the upper outer quadrant of the left breast measuring 8 mm. She was referred for left breast stereotactic biopsy performed at the [facility] 02/27/2015. This showed ([id number]) invasive ductal carcinoma, grade 2, estrogen receptor 100% positive, progesterone receptor 100% positive, both with strong staining intensity, with an MIB-1 of 20%, and HER-2 amplified with a signals ratio of 2.53, the number per cell being 2.65. 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  On 03/25/2015 the patient underwent bilateral breast MRI, which showed the breast density to be category B. this showed, in the left breast, an area of non-masslike enhancement measuring 7.8 cm. Associated with this were 2 adjacent irregular masses measuring 1.7 and 1.4 cm. The masses were 5 cm anterior and superior to the biopsy clip. There was a cortically thickened left axillary lymph node  noted.  Also in the right breast there was a 7 mm irregular enhancing mass at the 6:30 o'clock position.  Her subsequent history is as detailed below    INTERVAL HISTORY: Isabella Cook returns today for follow-up of her breast cancers, accompanied by her husband, [name]. Since her last visit here she completed her radiation treatments. She generally tolerated them well. She feels mild fatigue and her skin "held up fine". She is now ready to start anti-estrogens.  She continues to receive trastuzumab every 21 days. She had her most recent echocardiogram 10/20/2015 showing a well-preserved ejection fraction.  REVIEW OF SYSTEMS: Isabella Cook has a urinary tract infection, which has been hard to clear. This is what happens when she does not use Estring and she is very eager to get back on that medication. She has a little bit of a runny nose. She has some heartburn. Otherwise a detailed review of systems today was stable  PAST MEDICAL HISTORY: Past Medical History:  Diagnosis Date  . Arthritis    wrists and knees  . Cancer ([place]) 03-2015   left breast  . GERD (gastroesophageal reflux disease)   . History of hiatal hernia   . History of jaundice as a child   . History of radiation therapy completed 11/17/15   50 Gy to Right Breast, 60 Gy to Left Breast with regional nodal treatment on Left as well.   . Hypothyroidism   . Multiple food allergies   . Urinary tract infection     PAST SURGICAL HISTORY: Past Surgical History:  Procedure Laterality Date  . ABDOMINAL HYSTERECTOMY    . APPENDECTOMY    . BACK SURGERY     lumbar  . BREAST LUMPECTOMY WITH RADIOACTIVE SEED AND SENTINEL LYMPH NODE BIOPSY Bilateral 08/07/2015   Procedure: LEFT BREAST LUMPECTOMY LOCALIZED WITH 2 RADIOACTIVE  SEEDS, RIGHT BREAST SEED GUIDED LUMPECTOMY, BILATERAL SENTINEL LYMPH NODE MAPPING;  Surgeon: [name], MD;  Location: [place];  Service: General;  Laterality: Bilateral;  . BREAST RECONSTRUCTION Bilateral  09/02/2015   Procedure: BILATERAL ONCOPLASTIC RECONSTRUCTION WITH MASTOPEXY;  Surgeon: [name], MD;  Location: [place];  Service: Plastics;  Laterality: Bilateral;  . [place] [facility]  2016  . CHOLECYSTECTOMY    . HERNIA REPAIR     UHR  . MASTOPEXY Bilateral 09/02/2015   Procedure: MASTOPEXY;  Surgeon: [name], MD;  Location: [place];  Service: Plastics;  Laterality: Bilateral;  . PORTACATH PLACEMENT Right 04/22/2015   Procedure: INSERTION PORT-A-CATH WITH [place];  Surgeon: [name], MD;  Location: [place];  Service: General;  Laterality: Right;  . RE-EXCISION OF BREAST LUMPECTOMY Left 09/02/2015   Procedure: RE-EXCISION OF LEFT BREAST LUMPECTOMY;  Surgeon: [name], MD;  Location: [place];  Service: General;  Laterality: Left;  . TUBAL LIGATION      FAMILY HISTORY No family history on file. The patient's father had a history of pseudo-bulbar pulse 10 and died from a stroke at age 74. The patient's mother died at age 74 from complications of emphysema. The patient had no brothers, 2 sisters. There is no history of cancer in the family and specifically no history of breast or ovarian cancer.  GYNECOLOGIC HISTORY:  No LMP recorded. Patient has had a hysterectomy. Menarche age 16, first live birth age 14. She is GX P2. She underwent a hysterectomy with left salpingo-oophorectomy in 1984. She received hormone replacement for approximately 15 years, until 2005.  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 Her husband [name] is a retired Social research officer, government. Daughter Isabella Cook lives in [place] where she is a housewife, homeschooling her children. Son Isabella Cook lives in [place] and works in Chartered certified accountant. The patient has 2 grandchildren. She is not a church attender    ADVANCED DIRECTIVES: In place   HEALTH MAINTENANCE: Social  History  Substance Use Topics  . Smoking status: Never Smoker  . Smokeless tobacco: Never Used  . Alcohol use Yes     Comment: occ 1 every 2-3 months     Colonoscopy:2014  PAP:  Bone density: 2017 [facility]//osteopenia   Lipid panel:  Allergies  Allergen Reactions  . Dairy Aid [Lactase] Anaphylaxis     Takes claritin every day and benadryl, if needed, to prevent anaphylaxis   . Eggs Or Egg-Derived Products Anaphylaxis    Nasal stuffiness, Takes claritin every day and benadryl, if needed, to prevent anaphylaxis  . Gelatin (Bovine) [Beef Extract] Anaphylaxis    Muscle pain,  Takes claritin every day and benadryl, if needed, to prevent anaphylaxis   . Gluten Meal Anaphylaxis and Diarrhea    Takes claritin every day and benadryl, if needed, to prevent anaphylaxis   . Lambs Quarters Anaphylaxis    ALL "mammal" MEAT per pt -- Muscle pain  Can eat chicken, fish, [place]  . Milk-Related Compounds Anaphylaxis    Nasal stuffiness Cheese (mozzarella, swiss, cheddar, cottage)  . Pork Allergy Anaphylaxis    ALL "mammal" MEAT per pt -- Muscle pain  Can eat chicken, fish, [place]  . Wheat Bran Anaphylaxis and Diarrhea     Takes claritin every day and benadryl, if needed, to prevent anaphylaxis   . Whey Anaphylaxis and Diarrhea     Takes claritin  every day and benadryl, if needed, to prevent anaphylaxis   . Yeast Anaphylaxis     Takes claritin every day and benadryl, if needed, to prevent anaphylaxis  . Soy Allergy Diarrhea  . Almond Oil Nausea And Vomiting    Current Outpatient Prescriptions  Medication Sig Dispense Refill  . calcium carbonate (TUMS - DOSED IN MG ELEMENTAL CALCIUM) 500 MG chewable tablet Chew 500 mg by mouth daily as needed for indigestion or heartburn.     . diphenhydrAMINE (SOMINEX) 25 MG tablet Take 25 mg by mouth at bedtime as needed. Reported on 05/20/2015    . famotidine (PEPCID) 20 MG tablet Take 20 mg by mouth daily.     Marland Kitchen lidocaine-prilocaine (EMLA)  cream Apply to affected area once    . liothyronine (CYTOMEL) 5 MCG tablet Take 5 mcg by mouth daily. Patient reports she takes T3 because of a gelatin component that the Cytomel had    . loratadine (CLARITIN) 10 MG tablet Take 10 mg by mouth daily.    . Magnesium Citrate 100 MG TABS Take 200 mg by mouth daily.    . Methylcellulose, Laxative, (CITRUCEL) 500 MG TABS Take 2,000 mg by mouth 2 (two) times daily.    . naproxen sodium (ANAPROX) 220 MG tablet Take 440 mg by mouth 3 (three) times daily with meals as needed.     . nitrofurantoin, macrocrystal-monohydrate, (MACROBID) 100 MG capsule Take 1 capsule (100 mg total) by mouth 2 (two) times daily. 14 capsule 0  . ondansetron (ZOFRAN) 4 MG tablet Take 4 mg by mouth every 8 (eight) hours as needed for nausea or vomiting.    Vladimir Faster Glycol-Propyl Glycol (SYSTANE ULTRA) 0.4-0.3 % SOLN Place 1 drop into both eyes daily as needed (dry eyes).    . Probiotic Product (ALIGN PO) Take 1 tablet by mouth daily.     . promethazine (PHENERGAN) 12.5 MG tablet Take 12.5 mg by mouth every 6 (six) hours as needed for nausea or vomiting.    . tamoxifen (NOLVADEX) 20 MG tablet Take 1 tablet (20 mg total) by mouth daily. 90 tablet 12  . VITAMIN D-VITAMIN K PO Take 1 capsule by mouth daily. Reported on 04/15/2015     No current facility-administered medications for this visit.     OBJECTIVE: Middle-aged white woman In no acute distress Vitals:   12/23/15 0949  BP: (!) 143/78  Pulse: 94  Resp: 18  Temp: 97.7 F (36.5 C)     Body mass index is 29.35 kg/m.    ECOG FS:1 - Symptomatic but completely ambulatory  Sclerae unicteric, pupils round and equal Oropharynx clear and moist-- no thrush or other lesions No cervical or supraclavicular adenopathy Lungs no rales or rhonchi Heart regular rate and rhythm Abd soft, nontender, positive bowel sounds MSK no focal spinal tenderness, no upper extremity lymphedema Neuro: nonfocal, well oriented, appropriate  affect Breasts: Status post bilateral lumpectomies with bilateral reduction mammoplasty. The skin shows no peeling and minimal erythema remaining from her radiation treatments. Both axillae are benign.  LAB RESULTS:  CMP     Component Value Date/Time   NA 141 12/23/2015 0940   K 4.3 12/23/2015 0940   CO2 25 12/23/2015 0940   GLUCOSE 150 (H) 12/23/2015 0940   BUN 13.7 12/23/2015 0940   CREATININE 1.1 12/23/2015 0940   CALCIUM 10.5 (H) 12/23/2015 0940   PROT 7.4 12/23/2015 0940   ALBUMIN 3.5 12/23/2015 0940   AST 16 12/23/2015 0940   ALT 19 12/23/2015 0940  ALKPHOS 80 12/23/2015 0940   BILITOT 0.31 12/23/2015 0940    INo results found for: SPEP, UPEP  Lab Results  Component Value Date   WBC 3.8 (L) 12/23/2015   NEUTROABS 2.5 12/23/2015   HGB 11.6 12/23/2015   HCT 35.8 12/23/2015   MCV 88.4 12/23/2015   PLT 173 12/23/2015      Chemistry      Component Value Date/Time   NA 141 12/23/2015 0940   K 4.3 12/23/2015 0940   CO2 25 12/23/2015 0940   BUN 13.7 12/23/2015 0940   CREATININE 1.1 12/23/2015 0940      Component Value Date/Time   CALCIUM 10.5 (H) 12/23/2015 0940   ALKPHOS 80 12/23/2015 0940   AST 16 12/23/2015 0940   ALT 19 12/23/2015 0940   BILITOT 0.31 12/23/2015 0940       No results found for: LABCA2  No components found for: LABCA125  No results for input(s): INR in the last 168 hours.  Urinalysis    Component Value Date/Time   LABSPEC 1.005 12/12/2015 1410   PHURINE 6.0 12/12/2015 1410   GLUCOSEU Negative 12/12/2015 1410   HGBUR Negative 12/12/2015 1410   BILIRUBINUR Negative 12/12/2015 1410   KETONESUR Negative 12/12/2015 1410   PROTEINUR Negative 12/12/2015 1410   UROBILINOGEN 0.2 12/12/2015 1410   NITRITE Negative 12/12/2015 1410   LEUKOCYTESUR Small 12/12/2015 1410     ELIGIBLE FOR AVAILABLE RESEARCH PROTOCOL: not a PALLAS trial candidate as HER-2 positive   STUDIES: Ct Chest W Contrast  Result Date: 12/12/2015 CLINICAL DATA:   7 mm left lower lobe nodule seen on breast MRI EXAM: CT CHEST WITH CONTRAST TECHNIQUE: Multidetector CT imaging of the chest was performed during intravenous contrast administration. CONTRAST:  63m ISOVUE-300 IOPAMIDOL (ISOVUE-300) INJECTION 61% COMPARISON:  Breast MRI 07/10/2015. FINDINGS: Cardiovascular: The heart size is normal. No pericardial effusion. Coronary artery calcification is noted. No thoracic aortic aneurysm. Right Port-A-Cath tip is positioned in the mid SVC. Mediastinum/Nodes: No mediastinal lymphadenopathy. There is no hilar lymphadenopathy. The esophagus has normal imaging features. Small lymph nodes are seen in the axillary regions bilaterally. Surgical clips are noted in the right axilla. 2.8 x 2.7 cm fluid density lesion in the left axilla is likely a seroma or chronic hematoma from prior lymphadenectomy. Lungs/Pleura: No focal airspace consolidation. No pulmonary edema or pleural effusion. As noted on the previous MRI, any tiny pulmonary nodules identified in the left lung base, in the lingula. This measures 6 mm by CT imaging today suggesting no change since the MRI 5 months ago. No other pulmonary nodule or mass. Upper Abdomen: No focal abnormality identified within the visualized portions of the liver. Mild prominence of the intra and extrahepatic bile ducts likely secondary to prior cholecystectomy. Tiny hiatal hernia noted. Musculoskeletal: Bone windows reveal no worrisome lytic or sclerotic osseous lesions. IMPRESSION: 1. 6 mm nodule identified inferior lingula, corresponding to the nodule seen on the previous MRI. This is been stable on the 5 month interval since the prior exam. Non-contrast chest CT at 6-12 months is recommended. If the nodule is stable at time of repeat CT, then future CT at 18-24 months (from today's scan) is considered optional for low-risk patients, but is recommended for high-risk patients. This recommendation follows the consensus statement: Guidelines for  Management of Incidental Pulmonary Nodules Detected on CT Images: From the Fleischner Society 2017; Radiology 2017; 284:228-243. Electronically Signed   By: EMisty StanleyM.D.   On: 12/12/2015 14:54  ASSESSMENT: 75 y.o. [facility] woman  status post left breast upper outer quadrant biopsy 02/27/2015 for a clinical mT1-3 N1 invasive ductal carcinoma, grade 2, strongly estrogen and progesterone receptor positive, HER-2 amplified, with an MIB-1 of 20%.   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  (b) right breast lower outer quadrant: a clinical T1b N0, stage IA invasive ductal carcinoma, estrogen and progesterone receptor positive, HER-2 not amplified, with an Mib-1 of 4%  (2)  neoadjuvant chemotherapy consisting of weekly paclitaxel 12 together with weekly trastuzumab started 04/29/2015  (a) 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  (a) echo 10/20/2015 shows an ejection fraction of 60-65 %   (4) on 08/07/2015 she underwent  (a) right lumpectomy and sentinel lymph node sampling showing ypT0 ypN0 (complete pathologic response)  (b) two left lumpectomies, for ympT2 ypN1 invasive ductal carcinoma, grade 1, with positive margins.  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 The Right  breast was treated to 50 Gy in 25 fractions at 2 Gy per fraction. 2. The Left breast (4-field) was treated to 50 Gy in 25 fractions at 2 Gy per fraction. 3. The Left breast was boosted to 10 Gy in 5 fractions at 2 Gy per fraction.   (7). Tamoxifen started 12/23/2015 --  (a) discontinued Estring as of 05/05/2015, to be resumed January 2018 under cover of tamoxifen  (8) incidentally noted left lower lobe 0.7 cm nodule on 07/10/2015 MRI , requiring eventual follow-up  PLAN: Dealva has completed her local treatments for her breast cancer and is now ready to consider tamoxifen. We spent approximately 30 minutes going over her situation in detail and explaining the positives as well as the possible toxicities side effects of this antiestrogen.  She understands that tamoxifen basically cuts the risk of breast cancer in half. This is the risk of this breast cancer recurring locally, recurring systemically, and also the risk of a new breast cancer developing in either breast. We discussed cost issues. We then went into the possible side effects and she is status post hysterectomy so the most worrisome problem, the issue regarding uterine cancer, is not an issue. We did discuss the blood clots problem but she has taken estrogens before with no clots and the risk with tamoxifen is the same or slightly less.  She would like to start now because she would like to get going on the Estring. We discussed the new data from [place] which shows that even the very small amounts of estrogen absorbed transvaginally can increase the risk of breast cancer. Nevertheless I do feel that while she is on tamoxifen it is safe to use the Estring and she will started in January as soon as we know that she will tolerate tamoxifen well.  Incidentally she does have a gelatin or meat products allergy and if gelatin is used in the compounding of  tamoxifen that may be an issue. I have consulted with pharmacy regarding this  Finally the  CT scan of the chest obtained to follow-up the lung nodule previously noted shows no change in neurology, in fact smaller, and also no evidence of metastatic disease. We will repeat this year from now one final time.  She will then return to see me in mid March. At that point if she is tolerating tamoxifen well I will start seeing her on an every six-month basis alternating with Dr. Alver Fisher, MD   12/23/2015 6:00 PM

## 2016-01-13 ENCOUNTER — Other Ambulatory Visit: Payer: Self-pay | Admitting: Oncology

## 2016-01-13 ENCOUNTER — Ambulatory Visit (HOSPITAL_BASED_OUTPATIENT_CLINIC_OR_DEPARTMENT_OTHER): Payer: Medicare Other

## 2016-01-13 ENCOUNTER — Other Ambulatory Visit (HOSPITAL_BASED_OUTPATIENT_CLINIC_OR_DEPARTMENT_OTHER): Payer: Medicare Other

## 2016-01-13 VITALS — BP 161/80 | HR 79 | Temp 98.0°F | Resp 18

## 2016-01-13 DIAGNOSIS — C50511 Malignant neoplasm of lower-outer quadrant of right female breast: Secondary | ICD-10-CM

## 2016-01-13 DIAGNOSIS — Z5112 Encounter for antineoplastic immunotherapy: Secondary | ICD-10-CM

## 2016-01-13 DIAGNOSIS — C50412 Malignant neoplasm of upper-outer quadrant of left female breast: Secondary | ICD-10-CM

## 2016-01-13 LAB — CBC WITH DIFFERENTIAL/PLATELET
BASO%: 0.8 % (ref 0.0–2.0)
Basophils Absolute: 0 10*3/uL (ref 0.0–0.1)
EOS ABS: 0.2 10*3/uL (ref 0.0–0.5)
EOS%: 4.3 % (ref 0.0–7.0)
HEMATOCRIT: 37.4 % (ref 34.8–46.6)
HEMOGLOBIN: 12.7 g/dL (ref 11.6–15.9)
LYMPH#: 0.8 10*3/uL — AB (ref 0.9–3.3)
LYMPH%: 20.8 % (ref 14.0–49.7)
MCH: 30.2 pg (ref 25.1–34.0)
MCHC: 34 g/dL (ref 31.5–36.0)
MCV: 88.8 fL (ref 79.5–101.0)
MONO#: 0.6 10*3/uL (ref 0.1–0.9)
MONO%: 14 % (ref 0.0–14.0)
NEUT#: 2.4 10*3/uL (ref 1.5–6.5)
NEUT%: 60.1 % (ref 38.4–76.8)
PLATELETS: 144 10*3/uL — AB (ref 145–400)
RBC: 4.21 10*6/uL (ref 3.70–5.45)
RDW: 14.8 % — AB (ref 11.2–14.5)
WBC: 3.9 10*3/uL (ref 3.9–10.3)

## 2016-01-13 LAB — COMPREHENSIVE METABOLIC PANEL
ALBUMIN: 3.7 g/dL (ref 3.5–5.0)
ALK PHOS: 54 U/L (ref 40–150)
ALT: 12 U/L (ref 0–55)
ANION GAP: 7 meq/L (ref 3–11)
AST: 16 U/L (ref 5–34)
BILIRUBIN TOTAL: 0.41 mg/dL (ref 0.20–1.20)
BUN: 11.1 mg/dL (ref 7.0–26.0)
CALCIUM: 10 mg/dL (ref 8.4–10.4)
CO2: 26 mEq/L (ref 22–29)
CREATININE: 1 mg/dL (ref 0.6–1.1)
Chloride: 109 mEq/L (ref 98–109)
EGFR: 57 mL/min/{1.73_m2} — AB (ref >90)
Glucose: 104 mg/dl (ref 70–140)
Potassium: 4.4 mEq/L (ref 3.5–5.1)
Sodium: 142 mEq/L (ref 136–145)
TOTAL PROTEIN: 6.8 g/dL (ref 6.4–8.3)

## 2016-01-13 MED ORDER — DIPHENHYDRAMINE HCL 50 MG/ML IJ SOLN
25.0000 mg | Freq: Once | INTRAMUSCULAR | Status: AC
  Administered 2016-01-13: 25 mg via INTRAVENOUS

## 2016-01-13 MED ORDER — SODIUM CHLORIDE 0.9% FLUSH
10.0000 mL | INTRAVENOUS | Status: DC | PRN
  Administered 2016-01-13: 10 mL
  Filled 2016-01-13: qty 10

## 2016-01-13 MED ORDER — SODIUM CHLORIDE 0.9 % IV SOLN
Freq: Once | INTRAVENOUS | Status: AC
  Administered 2016-01-13: 11:00:00 via INTRAVENOUS

## 2016-01-13 MED ORDER — DIPHENHYDRAMINE HCL 50 MG/ML IJ SOLN
INTRAMUSCULAR | Status: AC
  Filled 2016-01-13: qty 1

## 2016-01-13 MED ORDER — ACETAMINOPHEN 325 MG PO TABS
ORAL_TABLET | ORAL | Status: AC
  Filled 2016-01-13: qty 2

## 2016-01-13 MED ORDER — HEPARIN SOD (PORK) LOCK FLUSH 100 UNIT/ML IV SOLN
500.0000 [IU] | Freq: Once | INTRAVENOUS | Status: AC | PRN
  Administered 2016-01-13: 500 [IU]
  Filled 2016-01-13: qty 5

## 2016-01-13 MED ORDER — ACETAMINOPHEN 325 MG PO TABS
650.0000 mg | ORAL_TABLET | Freq: Once | ORAL | Status: AC
  Administered 2016-01-13: 650 mg via ORAL

## 2016-01-13 MED ORDER — TRASTUZUMAB CHEMO 150 MG IV SOLR
6.0000 mg/kg | Freq: Once | INTRAVENOUS | Status: AC
  Administered 2016-01-13: 483 mg via INTRAVENOUS
  Filled 2016-01-13: qty 23

## 2016-01-13 NOTE — Patient Instructions (Signed)
[facility] Discharge Instructions for Patients Receiving Chemotherapy  Today you received the following chemotherapy agents: Herceptin   To help prevent nausea and vomiting after your treatment, we encourage you to take your nausea medication as directed.    If you develop nausea and vomiting that is not controlled by your nausea medication, call the clinic.   BELOW ARE SYMPTOMS THAT SHOULD BE REPORTED IMMEDIATELY:  *FEVER GREATER THAN 100.5 F  *CHILLS WITH OR WITHOUT FEVER  NAUSEA AND VOMITING THAT IS NOT CONTROLLED WITH YOUR NAUSEA MEDICATION  *UNUSUAL SHORTNESS OF BREATH  *UNUSUAL BRUISING OR BLEEDING  TENDERNESS IN MOUTH AND THROAT WITH OR WITHOUT PRESENCE OF ULCERS  *URINARY PROBLEMS  *BOWEL PROBLEMS  UNUSUAL RASH Items with * indicate a potential emergency and should be followed up as soon as possible.  Feel free to call the clinic you have any questions or concerns. The clinic phone number is [phone].  Please show the CHEMO ALERT CARD at check-in to the Emergency Department and triage nurse.   

## 2016-01-23 ENCOUNTER — Telehealth: Payer: Self-pay | Admitting: *Deleted

## 2016-01-23 MED ORDER — NITROFURANTOIN MONOHYD MACRO 100 MG PO CAPS: 100.0000 mg | ORAL_CAPSULE | Freq: Two times a day (BID) | ORAL | 0 refills | Status: DC

## 2016-01-23 NOTE — Telephone Encounter (Signed)
Message left by pt stating " Dr [name] at my last visit had recommended for me to start [place] but I did not take it and now would like to "  Return call number given as [phone].  This RN called pt who verified she completed prior 7 day prescription but did not feel at that time prophylatic dose would be necessary.  She states she is having another episode " flare up and realize I probably should have listened to the doctor "  Prescription for macrobid refilled at prior dose due to current symptoms of burning and urgency.  Per phone discussion pt understands if symptoms worsen or fever occurs she would need to be seen over the weekend at an urgent care.  This note will be sent to MD for review and dosing post current macrobid is completed.

## 2016-02-03 ENCOUNTER — Ambulatory Visit (HOSPITAL_BASED_OUTPATIENT_CLINIC_OR_DEPARTMENT_OTHER): Payer: Medicare Other

## 2016-02-03 ENCOUNTER — Other Ambulatory Visit (HOSPITAL_BASED_OUTPATIENT_CLINIC_OR_DEPARTMENT_OTHER): Payer: Medicare Other

## 2016-02-03 ENCOUNTER — Other Ambulatory Visit: Payer: Self-pay | Admitting: Oncology

## 2016-02-03 VITALS — BP 148/76 | HR 77 | Temp 98.0°F | Resp 18

## 2016-02-03 DIAGNOSIS — Z5112 Encounter for antineoplastic immunotherapy: Secondary | ICD-10-CM

## 2016-02-03 DIAGNOSIS — C50511 Malignant neoplasm of lower-outer quadrant of right female breast: Secondary | ICD-10-CM

## 2016-02-03 DIAGNOSIS — C50412 Malignant neoplasm of upper-outer quadrant of left female breast: Secondary | ICD-10-CM

## 2016-02-03 LAB — COMPREHENSIVE METABOLIC PANEL
ALBUMIN: 3.8 g/dL (ref 3.5–5.0)
ALT: 16 U/L (ref 0–55)
AST: 17 U/L (ref 5–34)
Alkaline Phosphatase: 54 U/L (ref 40–150)
Anion Gap: 7 mEq/L (ref 3–11)
BUN: 13.2 mg/dL (ref 7.0–26.0)
CHLORIDE: 108 meq/L (ref 98–109)
CO2: 26 mEq/L (ref 22–29)
CREATININE: 1 mg/dL (ref 0.6–1.1)
Calcium: 9.6 mg/dL (ref 8.4–10.4)
EGFR: 57 mL/min/{1.73_m2} — ABNORMAL LOW (ref >90)
GLUCOSE: 97 mg/dL (ref 70–140)
POTASSIUM: 4.3 meq/L (ref 3.5–5.1)
SODIUM: 141 meq/L (ref 136–145)
Total Bilirubin: 0.32 mg/dL (ref 0.20–1.20)
Total Protein: 6.9 g/dL (ref 6.4–8.3)

## 2016-02-03 LAB — CBC WITH DIFFERENTIAL/PLATELET
BASO%: 0.9 % (ref 0.0–2.0)
Basophils Absolute: 0 10*3/uL (ref 0.0–0.1)
EOS ABS: 0.2 10*3/uL (ref 0.0–0.5)
EOS%: 4 % (ref 0.0–7.0)
HCT: 37.8 % (ref 34.8–46.6)
HGB: 12.8 g/dL (ref 11.6–15.9)
LYMPH%: 20.5 % (ref 14.0–49.7)
MCH: 30.2 pg (ref 25.1–34.0)
MCHC: 33.7 g/dL (ref 31.5–36.0)
MCV: 89.5 fL (ref 79.5–101.0)
MONO#: 0.5 10*3/uL (ref 0.1–0.9)
MONO%: 11.7 % (ref 0.0–14.0)
NEUT%: 62.9 % (ref 38.4–76.8)
NEUTROS ABS: 2.8 10*3/uL (ref 1.5–6.5)
PLATELETS: 163 10*3/uL (ref 145–400)
RBC: 4.23 10*6/uL (ref 3.70–5.45)
RDW: 15.5 % — ABNORMAL HIGH (ref 11.2–14.5)
WBC: 4.5 10*3/uL (ref 3.9–10.3)
lymph#: 0.9 10*3/uL (ref 0.9–3.3)

## 2016-02-03 MED ORDER — SODIUM CHLORIDE 0.9% FLUSH
10.0000 mL | INTRAVENOUS | Status: DC | PRN
  Administered 2016-02-03: 10 mL
  Filled 2016-02-03: qty 10

## 2016-02-03 MED ORDER — DIPHENHYDRAMINE HCL 25 MG PO CAPS
ORAL_CAPSULE | ORAL | Status: AC
  Filled 2016-02-03: qty 1

## 2016-02-03 MED ORDER — ACETAMINOPHEN 325 MG PO TABS
ORAL_TABLET | ORAL | Status: AC
  Filled 2016-02-03: qty 2

## 2016-02-03 MED ORDER — DIPHENHYDRAMINE HCL 50 MG/ML IJ SOLN
25.0000 mg | Freq: Once | INTRAMUSCULAR | Status: AC
  Administered 2016-02-03: 25 mg via INTRAVENOUS

## 2016-02-03 MED ORDER — ACETAMINOPHEN 325 MG PO TABS
650.0000 mg | ORAL_TABLET | Freq: Once | ORAL | Status: AC
Start: 2016-02-03 — End: 2016-02-03
  Administered 2016-02-03: 650 mg via ORAL

## 2016-02-03 MED ORDER — SODIUM CHLORIDE 0.9 % IV SOLN
Freq: Once | INTRAVENOUS | Status: AC
  Administered 2016-02-03: 10:00:00 via INTRAVENOUS

## 2016-02-03 MED ORDER — DIPHENHYDRAMINE HCL 50 MG/ML IJ SOLN
INTRAMUSCULAR | Status: AC
  Filled 2016-02-03: qty 1

## 2016-02-03 MED ORDER — HEPARIN SOD (PORK) LOCK FLUSH 100 UNIT/ML IV SOLN
500.0000 [IU] | Freq: Once | INTRAVENOUS | Status: AC | PRN
  Administered 2016-02-03: 500 [IU]
  Filled 2016-02-03: qty 5

## 2016-02-03 MED ORDER — TRASTUZUMAB CHEMO 150 MG IV SOLR
6.0000 mg/kg | Freq: Once | INTRAVENOUS | Status: AC
  Administered 2016-02-03: 483 mg via INTRAVENOUS
  Filled 2016-02-03: qty 23

## 2016-02-03 NOTE — Patient Instructions (Signed)
[facility] Discharge Instructions for Patients Receiving Chemotherapy  Today you received the following chemotherapy agents: Herceptin   To help prevent nausea and vomiting after your treatment, we encourage you to take your nausea medication as directed.    If you develop nausea and vomiting that is not controlled by your nausea medication, call the clinic.   BELOW ARE SYMPTOMS THAT SHOULD BE REPORTED IMMEDIATELY:  *FEVER GREATER THAN 100.5 F  *CHILLS WITH OR WITHOUT FEVER  NAUSEA AND VOMITING THAT IS NOT CONTROLLED WITH YOUR NAUSEA MEDICATION  *UNUSUAL SHORTNESS OF BREATH  *UNUSUAL BRUISING OR BLEEDING  TENDERNESS IN MOUTH AND THROAT WITH OR WITHOUT PRESENCE OF ULCERS  *URINARY PROBLEMS  *BOWEL PROBLEMS  UNUSUAL RASH Items with * indicate a potential emergency and should be followed up as soon as possible.  Feel free to call the clinic you have any questions or concerns. The clinic phone number is [phone].  Please show the CHEMO ALERT CARD at check-in to the Emergency Department and triage nurse.   

## 2016-02-24 ENCOUNTER — Ambulatory Visit (HOSPITAL_BASED_OUTPATIENT_CLINIC_OR_DEPARTMENT_OTHER): Payer: Medicare Other

## 2016-02-24 ENCOUNTER — Other Ambulatory Visit: Payer: Self-pay | Admitting: *Deleted

## 2016-02-24 ENCOUNTER — Other Ambulatory Visit (HOSPITAL_BASED_OUTPATIENT_CLINIC_OR_DEPARTMENT_OTHER): Payer: Medicare Other

## 2016-02-24 VITALS — BP 137/67 | HR 71 | Temp 97.8°F | Resp 16

## 2016-02-24 DIAGNOSIS — C50511 Malignant neoplasm of lower-outer quadrant of right female breast: Secondary | ICD-10-CM | POA: Diagnosis not present

## 2016-02-24 DIAGNOSIS — C50412 Malignant neoplasm of upper-outer quadrant of left female breast: Secondary | ICD-10-CM | POA: Diagnosis present

## 2016-02-24 DIAGNOSIS — Z79899 Other long term (current) drug therapy: Secondary | ICD-10-CM

## 2016-02-24 DIAGNOSIS — Z5112 Encounter for antineoplastic immunotherapy: Secondary | ICD-10-CM | POA: Diagnosis present

## 2016-02-24 DIAGNOSIS — Z170 Estrogen receptor positive status [ER+]: Principal | ICD-10-CM

## 2016-02-24 DIAGNOSIS — Z5181 Encounter for therapeutic drug level monitoring: Secondary | ICD-10-CM

## 2016-02-24 LAB — CBC WITH DIFFERENTIAL/PLATELET
BASO%: 0.5 % (ref 0.0–2.0)
Basophils Absolute: 0 10*3/uL (ref 0.0–0.1)
EOS%: 4.9 % (ref 0.0–7.0)
Eosinophils Absolute: 0.2 10*3/uL (ref 0.0–0.5)
HCT: 37.8 % (ref 34.8–46.6)
HGB: 12.6 g/dL (ref 11.6–15.9)
LYMPH%: 19 % (ref 14.0–49.7)
MCH: 30.1 pg (ref 25.1–34.0)
MCHC: 33.3 g/dL (ref 31.5–36.0)
MCV: 90.4 fL (ref 79.5–101.0)
MONO#: 0.5 10*3/uL (ref 0.1–0.9)
MONO%: 12.9 % (ref 0.0–14.0)
NEUT#: 2.6 10*3/uL (ref 1.5–6.5)
NEUT%: 62.7 % (ref 38.4–76.8)
PLATELETS: 133 10*3/uL — AB (ref 145–400)
RBC: 4.18 10*6/uL (ref 3.70–5.45)
RDW: 15.1 % — ABNORMAL HIGH (ref 11.2–14.5)
WBC: 4.1 10*3/uL (ref 3.9–10.3)
lymph#: 0.8 10*3/uL — ABNORMAL LOW (ref 0.9–3.3)

## 2016-02-24 LAB — COMPREHENSIVE METABOLIC PANEL
ALT: 12 U/L (ref 0–55)
ANION GAP: 8 meq/L (ref 3–11)
AST: 15 U/L (ref 5–34)
Albumin: 3.7 g/dL (ref 3.5–5.0)
Alkaline Phosphatase: 47 U/L (ref 40–150)
BUN: 13.1 mg/dL (ref 7.0–26.0)
CHLORIDE: 107 meq/L (ref 98–109)
CO2: 25 meq/L (ref 22–29)
Calcium: 9.7 mg/dL (ref 8.4–10.4)
Creatinine: 1.1 mg/dL (ref 0.6–1.1)
EGFR: 52 mL/min/{1.73_m2} — AB (ref >90)
Glucose: 107 mg/dl (ref 70–140)
POTASSIUM: 4.4 meq/L (ref 3.5–5.1)
Sodium: 141 mEq/L (ref 136–145)
Total Bilirubin: 0.34 mg/dL (ref 0.20–1.20)
Total Protein: 6.7 g/dL (ref 6.4–8.3)

## 2016-02-24 MED ORDER — DIPHENHYDRAMINE HCL 50 MG/ML IJ SOLN
25.0000 mg | Freq: Once | INTRAMUSCULAR | Status: AC
  Administered 2016-02-24: 25 mg via INTRAVENOUS

## 2016-02-24 MED ORDER — SODIUM CHLORIDE 0.9 % IV SOLN
Freq: Once | INTRAVENOUS | Status: AC
  Administered 2016-02-24: 11:00:00 via INTRAVENOUS

## 2016-02-24 MED ORDER — ACETAMINOPHEN 325 MG PO TABS
ORAL_TABLET | ORAL | Status: AC
  Filled 2016-02-24: qty 2

## 2016-02-24 MED ORDER — SODIUM CHLORIDE 0.9% FLUSH
10.0000 mL | INTRAVENOUS | Status: DC | PRN
  Administered 2016-02-24: 10 mL
  Filled 2016-02-24: qty 10

## 2016-02-24 MED ORDER — ACETAMINOPHEN 325 MG PO TABS
650.0000 mg | ORAL_TABLET | Freq: Once | ORAL | Status: AC
  Administered 2016-02-24: 650 mg via ORAL

## 2016-02-24 MED ORDER — DIPHENHYDRAMINE HCL 50 MG/ML IJ SOLN
INTRAMUSCULAR | Status: AC
  Filled 2016-02-24: qty 1

## 2016-02-24 MED ORDER — TRASTUZUMAB CHEMO 150 MG IV SOLR
6.0000 mg/kg | Freq: Once | INTRAVENOUS | Status: AC
  Administered 2016-02-24: 483 mg via INTRAVENOUS
  Filled 2016-02-24: qty 23

## 2016-02-24 MED ORDER — HEPARIN SOD (PORK) LOCK FLUSH 100 UNIT/ML IV SOLN
500.0000 [IU] | Freq: Once | INTRAVENOUS | Status: AC | PRN
  Administered 2016-02-24: 500 [IU]
  Filled 2016-02-24: qty 5

## 2016-02-24 NOTE — Patient Instructions (Signed)
[facility] Discharge Instructions for Patients Receiving Chemotherapy  Today you received the following chemotherapy agents: Herceptin   To help prevent nausea and vomiting after your treatment, we encourage you to take your nausea medication as directed.    If you develop nausea and vomiting that is not controlled by your nausea medication, call the clinic.   BELOW ARE SYMPTOMS THAT SHOULD BE REPORTED IMMEDIATELY:  *FEVER GREATER THAN 100.5 F  *CHILLS WITH OR WITHOUT FEVER  NAUSEA AND VOMITING THAT IS NOT CONTROLLED WITH YOUR NAUSEA MEDICATION  *UNUSUAL SHORTNESS OF BREATH  *UNUSUAL BRUISING OR BLEEDING  TENDERNESS IN MOUTH AND THROAT WITH OR WITHOUT PRESENCE OF ULCERS  *URINARY PROBLEMS  *BOWEL PROBLEMS  UNUSUAL RASH Items with * indicate a potential emergency and should be followed up as soon as possible.  Feel free to call the clinic you have any questions or concerns. The clinic phone number is [phone].  Please show the CHEMO ALERT CARD at check-in to the Emergency Department and triage nurse.   

## 2016-02-24 NOTE — Progress Notes (Signed)
Please go ahead and treat patient today (most recent echo 1--16-17). Per Val Dod RN/Dr. Magrinat.  She is putting in order for stat echo, but do not hold treatment today.

## 2016-03-01 ENCOUNTER — Ambulatory Visit (HOSPITAL_COMMUNITY)
Admission: RE | Admit: 2016-03-01 | Discharge: 2016-03-01 | Disposition: A | Discharge status: Home / Self Care | Payer: Medicare Other | Source: Ambulatory Visit | Attending: Oncology | Admitting: Oncology

## 2016-03-01 DIAGNOSIS — Z5181 Encounter for therapeutic drug level monitoring: Secondary | ICD-10-CM | POA: Diagnosis not present

## 2016-03-01 DIAGNOSIS — Z170 Estrogen receptor positive status [ER+]: Secondary | ICD-10-CM | POA: Insufficient documentation

## 2016-03-01 DIAGNOSIS — C50412 Malignant neoplasm of upper-outer quadrant of left female breast: Secondary | ICD-10-CM | POA: Insufficient documentation

## 2016-03-01 DIAGNOSIS — Z79899 Other long term (current) drug therapy: Secondary | ICD-10-CM | POA: Diagnosis not present

## 2016-03-01 DIAGNOSIS — C50511 Malignant neoplasm of lower-outer quadrant of right female breast: Secondary | ICD-10-CM

## 2016-03-01 NOTE — Progress Notes (Signed)
  Echocardiogram 2D Echocardiogram has been performed.  Rosanne Wohlfarth L Androw 03/01/2016, 10:55 AM

## 2016-03-01 NOTE — Progress Notes (Signed)
  Echocardiogram 2D Echocardiogram has been performed.  Rosilyn Coachman L Androw 03/01/2016, 10:53 AM

## 2016-03-16 ENCOUNTER — Ambulatory Visit (HOSPITAL_BASED_OUTPATIENT_CLINIC_OR_DEPARTMENT_OTHER): Payer: Medicare Other

## 2016-03-16 ENCOUNTER — Ambulatory Visit (HOSPITAL_BASED_OUTPATIENT_CLINIC_OR_DEPARTMENT_OTHER): Payer: Medicare Other | Admitting: Oncology

## 2016-03-16 ENCOUNTER — Other Ambulatory Visit (HOSPITAL_BASED_OUTPATIENT_CLINIC_OR_DEPARTMENT_OTHER): Payer: Medicare Other

## 2016-03-16 VITALS — BP 140/79 | HR 81 | Temp 97.6°F | Resp 18 | Ht 63.5 in | Wt 174.0 lb

## 2016-03-16 DIAGNOSIS — L608 Other nail disorders: Secondary | ICD-10-CM | POA: Diagnosis not present

## 2016-03-16 DIAGNOSIS — C50412 Malignant neoplasm of upper-outer quadrant of left female breast: Secondary | ICD-10-CM

## 2016-03-16 DIAGNOSIS — C50511 Malignant neoplasm of lower-outer quadrant of right female breast: Secondary | ICD-10-CM

## 2016-03-16 DIAGNOSIS — R911 Solitary pulmonary nodule: Secondary | ICD-10-CM

## 2016-03-16 DIAGNOSIS — C50411 Malignant neoplasm of upper-outer quadrant of right female breast: Secondary | ICD-10-CM

## 2016-03-16 DIAGNOSIS — M8580 Other specified disorders of bone density and structure, unspecified site: Secondary | ICD-10-CM | POA: Diagnosis not present

## 2016-03-16 DIAGNOSIS — N898 Other specified noninflammatory disorders of vagina: Secondary | ICD-10-CM | POA: Diagnosis not present

## 2016-03-16 DIAGNOSIS — Z5112 Encounter for antineoplastic immunotherapy: Secondary | ICD-10-CM | POA: Diagnosis present

## 2016-03-16 DIAGNOSIS — Z170 Estrogen receptor positive status [ER+]: Secondary | ICD-10-CM

## 2016-03-16 DIAGNOSIS — E039 Hypothyroidism, unspecified: Secondary | ICD-10-CM

## 2016-03-16 LAB — COMPREHENSIVE METABOLIC PANEL
ALK PHOS: 44 U/L (ref 40–150)
ALT: 17 U/L (ref 0–55)
ANION GAP: 8 meq/L (ref 3–11)
AST: 20 U/L (ref 5–34)
Albumin: 3.8 g/dL (ref 3.5–5.0)
BILIRUBIN TOTAL: 0.35 mg/dL (ref 0.20–1.20)
BUN: 12.7 mg/dL (ref 7.0–26.0)
CALCIUM: 9.6 mg/dL (ref 8.4–10.4)
CO2: 27 mEq/L (ref 22–29)
CREATININE: 1 mg/dL (ref 0.6–1.1)
Chloride: 108 mEq/L (ref 98–109)
EGFR: 53 mL/min/{1.73_m2} — AB (ref >90)
Glucose: 94 mg/dl (ref 70–140)
Potassium: 4.3 mEq/L (ref 3.5–5.1)
Sodium: 142 mEq/L (ref 136–145)
TOTAL PROTEIN: 6.8 g/dL (ref 6.4–8.3)

## 2016-03-16 LAB — CBC WITH DIFFERENTIAL/PLATELET
BASO%: 0.9 % (ref 0.0–2.0)
Basophils Absolute: 0 10*3/uL (ref 0.0–0.1)
EOS%: 4.2 % (ref 0.0–7.0)
Eosinophils Absolute: 0.2 10*3/uL (ref 0.0–0.5)
HEMATOCRIT: 38.7 % (ref 34.8–46.6)
HGB: 13 g/dL (ref 11.6–15.9)
LYMPH#: 0.8 10*3/uL — AB (ref 0.9–3.3)
LYMPH%: 17.9 % (ref 14.0–49.7)
MCH: 30.5 pg (ref 25.1–34.0)
MCHC: 33.5 g/dL (ref 31.5–36.0)
MCV: 90.8 fL (ref 79.5–101.0)
MONO#: 0.6 10*3/uL (ref 0.1–0.9)
MONO%: 13.2 % (ref 0.0–14.0)
NEUT%: 63.8 % (ref 38.4–76.8)
NEUTROS ABS: 2.8 10*3/uL (ref 1.5–6.5)
PLATELETS: 156 10*3/uL (ref 145–400)
RBC: 4.26 10*6/uL (ref 3.70–5.45)
RDW: 15.4 % — ABNORMAL HIGH (ref 11.2–14.5)
WBC: 4.5 10*3/uL (ref 3.9–10.3)

## 2016-03-16 MED ORDER — DIPHENHYDRAMINE HCL 50 MG/ML IJ SOLN
INTRAMUSCULAR | Status: AC
  Filled 2016-03-16: qty 1

## 2016-03-16 MED ORDER — SODIUM CHLORIDE 0.9 % IV SOLN
Freq: Once | INTRAVENOUS | Status: AC
  Administered 2016-03-16: 11:00:00 via INTRAVENOUS

## 2016-03-16 MED ORDER — ACETAMINOPHEN 325 MG PO TABS
650.0000 mg | ORAL_TABLET | Freq: Once | ORAL | Status: AC
  Administered 2016-03-16: 650 mg via ORAL

## 2016-03-16 MED ORDER — TRASTUZUMAB CHEMO 150 MG IV SOLR
6.0000 mg/kg | Freq: Once | INTRAVENOUS | Status: AC
  Administered 2016-03-16: 483 mg via INTRAVENOUS
  Filled 2016-03-16: qty 23

## 2016-03-16 MED ORDER — ACETAMINOPHEN 325 MG PO TABS
ORAL_TABLET | ORAL | Status: AC
  Filled 2016-03-16: qty 2

## 2016-03-16 MED ORDER — HEPARIN SOD (PORK) LOCK FLUSH 100 UNIT/ML IV SOLN
500.0000 [IU] | Freq: Once | INTRAVENOUS | Status: AC | PRN
  Administered 2016-03-16: 500 [IU]
  Filled 2016-03-16: qty 5

## 2016-03-16 MED ORDER — SODIUM CHLORIDE 0.9% FLUSH
10.0000 mL | INTRAVENOUS | Status: DC | PRN
  Administered 2016-03-16: 10 mL
  Filled 2016-03-16: qty 10

## 2016-03-16 MED ORDER — DIPHENHYDRAMINE HCL 50 MG/ML IJ SOLN
25.0000 mg | Freq: Once | INTRAMUSCULAR | Status: AC
  Administered 2016-03-16: 25 mg via INTRAVENOUS

## 2016-03-16 NOTE — Patient Instructions (Signed)
[facility] Discharge Instructions for Patients Receiving Chemotherapy  Today you received the following chemotherapy agents: Herceptin   To help prevent nausea and vomiting after your treatment, we encourage you to take your nausea medication as directed.    If you develop nausea and vomiting that is not controlled by your nausea medication, call the clinic.   BELOW ARE SYMPTOMS THAT SHOULD BE REPORTED IMMEDIATELY:  *FEVER GREATER THAN 100.5 F  *CHILLS WITH OR WITHOUT FEVER  NAUSEA AND VOMITING THAT IS NOT CONTROLLED WITH YOUR NAUSEA MEDICATION  *UNUSUAL SHORTNESS OF BREATH  *UNUSUAL BRUISING OR BLEEDING  TENDERNESS IN MOUTH AND THROAT WITH OR WITHOUT PRESENCE OF ULCERS  *URINARY PROBLEMS  *BOWEL PROBLEMS  UNUSUAL RASH Items with * indicate a potential emergency and should be followed up as soon as possible.  Feel free to call the clinic you have any questions or concerns. The clinic phone number is [phone].  Please show the CHEMO ALERT CARD at check-in to the Emergency Department and triage nurse.   

## 2016-03-16 NOTE — Progress Notes (Signed)
Isabella Cook  [phone] [phone]   ID: Isabella Cook DOB: Sep 09, 1940  MR#: [id number]  XHB#:[id number]  Patient Care Team: Isabella Singh, FNP as PCP - General (Family Medicine) Isabella Cruel, MD as Consulting Physician (Oncology) Isabella Luna, MD as Consulting Physician (General Surgery) Isabella Cheese, NP as Nurse Practitioner (Hematology and Oncology) Isabella Cruel, MD as Consulting Physician (Oncology) Isabella Silversmith, MD (Inactive) as Consulting Physician (Radiation Oncology) Isabella Levine, MD as Consulting Physician (Neurosurgery) Isabella Booty, MD as Referring Physician (Otolaryngology) Isabella Artist, MD as Consulting Physician (Cardiology) PCP: Isabella Singh, FNP OTHER MD:  CHIEF COMPLAINT: HER-2 positive breast cancer, bilateral beast cancers  CURRENT TREATMENT: Tamoxifen, trastuzumab  BREAST CANCER HISTORY:   From the original intake note:  Isabella Cook had routine screening mammography at [facility] suggesting an area of indeterminate microcalcifications in the upper outer quadrant of the left breast measuring 8 mm. She was referred for left breast stereotactic biopsy performed at the [place] 02/27/2015. This showed ([id number]) invasive ductal carcinoma, grade 2, estrogen receptor 100% positive, progesterone receptor 100% positive, both with strong staining intensity, with an MIB-1 of 20%, and HER-2 amplified with a signals ratio of 2.53, the number per cell being 2.65. 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  On 03/25/2015 the patient underwent bilateral breast MRI, which showed the breast density to be category B. this showed, in the left breast, an area of non-masslike enhancement measuring 7.8 cm. Associated with this were 2 adjacent irregular masses measuring 1.7 and 1.4 cm. The masses were 5 cm anterior and superior to the biopsy clip. There was a cortically thickened left axillary lymph node  noted.  Also in the right breast there was a 7 mm irregular enhancing mass at the 6:30 o'clock position.  Her subsequent history is as detailed below    INTERVAL HISTORY: Isabella Cook returns today for follow-up of her estrogen receptor positive and HER-2 positive breast cancer, accompanied by her husband. Today she receives her antepenultimate Herceptin treatment. She is tolerating those well. She just had an echocardiogram late February and it showed an ejection fraction in the 50-55% range.(  She started tamoxifen since our last visit here. She is tolerating that well, with no significant hot flashes and no vaginal wetness. She then resumed her Estring, and she reports slightly improved vaginal health as a result  REVIEW OF SYSTEMS: Isabella Cook has developed some dryness around her fingertips and mild discomfort around the nail beds. She does not do a lot of dishwashing and she denies exposure to garden chemicals. She tells me her thyroid medication was recently changed by her allergist. Aside from this she denies unusual headaches, visual changes, nausea, vomiting, dizziness, or gait imbalance. There has been no cough, phlegm production, or pleurisy. She denies any change in bowel or bladder habits. The only exercise she does is walking her daughter once a day. A detailed review of systems today was otherwise stable  PAST MEDICAL HISTORY: Past Medical History:  Diagnosis Date  . Arthritis    wrists and knees  . Cancer ([place]) 03-2015   left breast  . GERD (gastroesophageal reflux disease)   . History of hiatal hernia   . History of jaundice as a child   . History of radiation therapy completed 11/17/15   50 Gy to Right Breast, 60 Gy to Left Breast with regional nodal treatment on Left as well.   . Hypothyroidism   . Multiple food allergies   .  Urinary tract infection     PAST SURGICAL HISTORY: Past Surgical History:  Procedure Laterality Date  . ABDOMINAL HYSTERECTOMY    . APPENDECTOMY    .  BACK SURGERY     lumbar  . BREAST LUMPECTOMY WITH RADIOACTIVE SEED AND SENTINEL LYMPH NODE BIOPSY Bilateral 08/07/2015   Procedure: LEFT BREAST LUMPECTOMY LOCALIZED WITH 2 RADIOACTIVE SEEDS, RIGHT BREAST SEED GUIDED LUMPECTOMY, BILATERAL SENTINEL LYMPH NODE MAPPING;  Surgeon: Isabella Luna, MD;  Location: [facility];  Service: General;  Laterality: Bilateral;  . BREAST RECONSTRUCTION Bilateral 09/02/2015   Procedure: BILATERAL ONCOPLASTIC RECONSTRUCTION WITH MASTOPEXY;  Surgeon: Isabella Limbo, MD;  Location: [facility];  Service: Plastics;  Laterality: Bilateral;  . [place] SURGERY  2016  . CHOLECYSTECTOMY    . HERNIA REPAIR     UHR  . MASTOPEXY Bilateral 09/02/2015   Procedure: MASTOPEXY;  Surgeon: Isabella Limbo, MD;  Location: [facility];  Service: Plastics;  Laterality: Bilateral;  . PORTACATH PLACEMENT Right 04/22/2015   Procedure: INSERTION PORT-A-CATH WITH [place];  Surgeon: Isabella Luna, MD;  Location: [facility];  Service: General;  Laterality: Right;  . RE-EXCISION OF BREAST LUMPECTOMY Left 09/02/2015   Procedure: RE-EXCISION OF LEFT BREAST LUMPECTOMY;  Surgeon: Isabella Luna, MD;  Location: [facility];  Service: General;  Laterality: Left;  . TUBAL LIGATION      FAMILY HISTORY No family history on file. The patient's father had a history of pseudo-bulbar pulse 41 and died from a stroke at age 33. The patient's mother died at age 66 from complications of emphysema. The patient had no brothers, 2 sisters. There is no history of cancer in the family and specifically no history of breast or ovarian cancer.  GYNECOLOGIC HISTORY:  No LMP recorded. Patient has had a hysterectomy. Menarche age 20, first live birth age 79. She is GX P2. She underwent a hysterectomy with left salpingo-oophorectomy in 1984. She received hormone replacement for approximately 15 years, until 2005.  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 Her husband Isabella Cook is a retired Social research officer, government. Daughter Isabella Cook lives in [place] where she is a housewife, homeschooling her children. Son Isabella Cook lives in [place] and works in Chartered certified accountant. The patient has 2 grandchildren. She is not a church attender    ADVANCED DIRECTIVES: In place   HEALTH MAINTENANCE: Social History  Substance Use Topics  . Smoking status: Never Smoker  . Smokeless tobacco: Never Used  . Alcohol use Yes     Comment: occ 1 every 2-3 months     Colonoscopy:2014  PAP:  Bone density: 2017 [facility]//osteopenia   Lipid panel:  Allergies  Allergen Reactions  . Dairy Aid [Lactase] Anaphylaxis     Takes claritin every day and benadryl, if needed, to prevent anaphylaxis   . Eggs Or Egg-Derived Products Anaphylaxis    Nasal stuffiness, Takes claritin every day and benadryl, if needed, to prevent anaphylaxis  . Gelatin (Bovine) [Beef Extract] Anaphylaxis    Muscle pain,  Takes claritin every day and benadryl, if needed, to prevent anaphylaxis   . Gluten Meal Anaphylaxis and Diarrhea    Takes claritin every day and benadryl, if needed, to prevent anaphylaxis   . Lambs Quarters Anaphylaxis    ALL "mammal" MEAT per pt -- Muscle pain  Can eat chicken, fish, [place]  . Milk-Related Compounds Anaphylaxis    Nasal stuffiness Cook (  mozzarella, swiss, cheddar, cottage)  . Pork Allergy Anaphylaxis    ALL "mammal" MEAT per pt -- Muscle pain  Can eat chicken, fish, [place]  . Wheat Bran Anaphylaxis and Diarrhea     Takes claritin every day and benadryl, if needed, to prevent anaphylaxis   . Whey Anaphylaxis and Diarrhea     Takes claritin every day and benadryl, if needed, to prevent anaphylaxis   . Yeast Anaphylaxis     Takes claritin every day and benadryl, if needed, to prevent anaphylaxis  . Soy Allergy Diarrhea  . Almond Oil Nausea And Vomiting     Current Isabella Cook Prescriptions  Medication Sig Dispense Refill  . estradiol (ESTRING) 2 MG vaginal ring Place 2 mg vaginally every 3 (three) months. follow package directions    . Liothyronine Sodium POWD 5 mcg by Does not apply route.    . tamoxifen (NOLVADEX) 20 MG tablet Take 20 mg by mouth daily.    . calcium carbonate (TUMS - DOSED IN MG ELEMENTAL CALCIUM) 500 MG chewable tablet Chew 500 mg by mouth daily as needed for indigestion or heartburn.     . diphenhydrAMINE (SOMINEX) 25 MG tablet Take 25 mg by mouth at bedtime as needed. Reported on 05/20/2015    . famotidine (PEPCID) 20 MG tablet Take 20 mg by mouth daily.     Marland Kitchen lidocaine-prilocaine (EMLA) cream Apply to affected area once    . loratadine (CLARITIN) 10 MG tablet Take 10 mg by mouth daily.    . Magnesium Citrate 100 MG TABS Take 200 mg by mouth daily.    . Methylcellulose, Laxative, (CITRUCEL) 500 MG TABS Take 2,000 mg by mouth 2 (two) times daily.    . naproxen sodium (ANAPROX) 220 MG tablet Take 440 mg by mouth 3 (three) times daily with meals as needed.     Vladimir Faster Glycol-Propyl Glycol (SYSTANE ULTRA) 0.4-0.3 % SOLN Place 1 drop into both eyes daily as needed (dry eyes).    . Probiotic Product (ALIGN PO) Take 1 tablet by mouth daily.     Marland Kitchen VITAMIN D-VITAMIN K PO Take 1 capsule by mouth daily. Reported on 04/15/2015     No current facility-administered medications for this visit.     OBJECTIVE: Middle-aged white woman Who appears well.  Vitals:   03/16/16 0935  BP: 140/79  Pulse: 81  Resp: 18  Temp: 97.6 F (36.4 C)     Body mass index is 30.34 kg/m.    ECOG FS:0 - Asymptomatic  Sclerae unicteric, EOMs intact Oropharynx clear and moist No cervical or supraclavicular adenopathy Lungs no rales or rhonchi Heart regular rate and rhythm Abd soft, nontender, positive bowel sounds MSK no focal spinal tenderness, no upper extremity lymphedema Neuro: nonfocal, well oriented, appropriate affect Breasts: She has  had bilateral reduction mammoplasties as well as bilateral lumpectomies. She also had radiation to both breasts. There is minimal erythema remaining. There is minimal discomfort on the underside of her left breast, were the bra is a little bit tight. There is no evidence of local recurrence. Both axillae are benign.  LAB RESULTS:  CMP     Component Value Date/Time   NA 142 03/16/2016 0924   K 4.3 03/16/2016 0924   CO2 27 03/16/2016 0924   GLUCOSE 94 03/16/2016 0924   BUN 12.7 03/16/2016 0924   CREATININE 1.0 03/16/2016 0924   CALCIUM 9.6 03/16/2016 0924   PROT 6.8 03/16/2016 0924   ALBUMIN 3.8 03/16/2016 0924   AST 20 03/16/2016  0924   ALT 17 03/16/2016 0924   ALKPHOS 44 03/16/2016 0924   BILITOT 0.35 03/16/2016 0924    INo results found for: SPEP, UPEP  Lab Results  Component Value Date   WBC 4.5 03/16/2016   NEUTROABS 2.8 03/16/2016   HGB 13.0 03/16/2016   HCT 38.7 03/16/2016   MCV 90.8 03/16/2016   PLT 156 03/16/2016      Chemistry      Component Value Date/Time   NA 142 03/16/2016 0924   K 4.3 03/16/2016 0924   CO2 27 03/16/2016 0924   BUN 12.7 03/16/2016 0924   CREATININE 1.0 03/16/2016 0924      Component Value Date/Time   CALCIUM 9.6 03/16/2016 0924   ALKPHOS 44 03/16/2016 0924   AST 20 03/16/2016 0924   ALT 17 03/16/2016 0924   BILITOT 0.35 03/16/2016 0924       No results found for: LABCA2  No components found for: [id number]  No results for input(s): INR in the last 168 hours.  Urinalysis    Component Value Date/Time   LABSPEC 1.005 12/12/2015 1410   PHURINE 6.0 12/12/2015 1410   GLUCOSEU Negative 12/12/2015 1410   HGBUR Negative 12/12/2015 1410   BILIRUBINUR Negative 12/12/2015 1410   KETONESUR Negative 12/12/2015 1410   PROTEINUR Negative 12/12/2015 1410   UROBILINOGEN 0.2 12/12/2015 1410   NITRITE Negative 12/12/2015 1410   LEUKOCYTESUR Small 12/12/2015 1410     ELIGIBLE FOR AVAILABLE RESEARCH PROTOCOL: not a [facility] trial candidate  as HER-2 positive   STUDIES: Transthoracic Echocardiography  Patient:    Isabella, Cook MR #:       [id number] Study Date: 03/01/2016 Gender:     F Age:        76 Height:     161.3 cm Weight:     76.3 kg BSA:        1.87 m^2 Pt. Status: Room:   ATTENDING    Isabella Cook, Isabella Cook     Isabella Cook, Isabella Cook  REFERRING    Isabella Cook, Isabella Cook  PERFORMING   Isabella Cook, Isabella Cook  SONOGRAPHER  Isabella Cook  cc:  ------------------------------------------------------------------- LV EF: 50% -   55%     ASSESSMENT: 76 y.o. [facility] woman  status post left breast upper outer quadrant biopsy 02/27/2015 for a clinical mT1-3 N1 invasive ductal carcinoma, grade 2, strongly estrogen and progesterone receptor positive, HER-2 amplified, with an MIB-1 of 20%.   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  (b) right breast lower outer quadrant: a clinical T1b N0, stage IA invasive ductal carcinoma, estrogen and progesterone receptor positive, HER-2 not amplified, with an Mib-1 of 4%  (2)  neoadjuvant chemotherapy consisting of weekly paclitaxel 12 together with weekly trastuzumab started 04/29/2015  (a) 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  (a) echo 10/20/2015 shows an ejection fraction of 60-65 %   (4) on 08/07/2015 she underwent  (a) right lumpectomy and sentinel lymph node sampling showing ypT0 ypN0 (complete  pathologic response)  (b) two left lumpectomies, for ympT2 ypN1 invasive ductal carcinoma, grade 1, with positive margins.  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 The Right breast was treated to 50 Gy in 25 fractions at 2 Gy per fraction. 2. The Left breast (4-field) was treated to 50 Gy in 25 fractions at 2 Gy per fraction. 3. The Left breast was boosted to 10 Gy in 5 fractions at 2 Gy per fraction.   (7). Tamoxifen started 12/23/2015 --  (a) discontinued Estring as of 05/05/2015, resumed January 2018 under cover of tamoxifen  (8) incidentally noted left lower lobe 0.7 cm nodule on 07/10/2015 MRI , requiring eventual follow-up  (a) repeat CT scan of the chest 12/12/2015 showed the nodule unchanged (0.6 cm)  (b) repeat CT scan mid 2019 suggested per radiology   PLAN: I spent approximately 30 minutes with Isabella Cook with most of that time spent discussing her several problems. Kahealani is tolerating tamoxifen well and the plan will be to continue that a minimum of 5 years. This allows her to use Estring safely for vaginal dryness problems. She has 3 more doses of trastuzumab to go, including today's. She will have her last dose on 04/27/2016  We reviewed her most recent echocardiogram which shows an apparent drop in her ejection fraction, though still in the normal range, and not significant as compared to her original echocardiogram in July of last year. Nevertheless I think it would be useful for our cardio-oncology team to take a look at her scans and I have sent them an note regarding this.  Once she completes her Herceptin she can have her port removed and I have alerted her surgeon as the patient will be her to get this done late April or early May.  I think the dryness around her fingertips and nail beds may be related to thyroid problems. If she is on an  unusual dose of T3 and I will check her thyroid tests when she returns 3 weeks from now.  Otherwise she will see me again in May. At that time we should be ready to initiate long-term follow-up.  She knows to call for any problems that may develop before that visit.       Isabella Cruel, MD   03/16/2016 10:05 AM

## 2016-04-06 ENCOUNTER — Ambulatory Visit (HOSPITAL_BASED_OUTPATIENT_CLINIC_OR_DEPARTMENT_OTHER): Payer: Medicare Other

## 2016-04-06 ENCOUNTER — Other Ambulatory Visit (HOSPITAL_BASED_OUTPATIENT_CLINIC_OR_DEPARTMENT_OTHER): Payer: Medicare Other

## 2016-04-06 VITALS — BP 153/82 | HR 70 | Temp 98.3°F | Resp 18

## 2016-04-06 DIAGNOSIS — Z5112 Encounter for antineoplastic immunotherapy: Secondary | ICD-10-CM

## 2016-04-06 DIAGNOSIS — E039 Hypothyroidism, unspecified: Secondary | ICD-10-CM | POA: Diagnosis not present

## 2016-04-06 DIAGNOSIS — Z170 Estrogen receptor positive status [ER+]: Secondary | ICD-10-CM

## 2016-04-06 DIAGNOSIS — C50411 Malignant neoplasm of upper-outer quadrant of right female breast: Secondary | ICD-10-CM | POA: Diagnosis present

## 2016-04-06 DIAGNOSIS — C50511 Malignant neoplasm of lower-outer quadrant of right female breast: Secondary | ICD-10-CM

## 2016-04-06 DIAGNOSIS — C50412 Malignant neoplasm of upper-outer quadrant of left female breast: Secondary | ICD-10-CM

## 2016-04-06 LAB — CBC WITH DIFFERENTIAL/PLATELET
BASO%: 1 % (ref 0.0–2.0)
BASOS ABS: 0 10*3/uL (ref 0.0–0.1)
EOS ABS: 0.2 10*3/uL (ref 0.0–0.5)
EOS%: 5 % (ref 0.0–7.0)
HCT: 38.6 % (ref 34.8–46.6)
HGB: 13 g/dL (ref 11.6–15.9)
LYMPH%: 17.8 % (ref 14.0–49.7)
MCH: 30.6 pg (ref 25.1–34.0)
MCHC: 33.7 g/dL (ref 31.5–36.0)
MCV: 90.7 fL (ref 79.5–101.0)
MONO#: 0.5 10*3/uL (ref 0.1–0.9)
MONO%: 11.5 % (ref 0.0–14.0)
NEUT#: 2.8 10*3/uL (ref 1.5–6.5)
NEUT%: 64.7 % (ref 38.4–76.8)
Platelets: 158 10*3/uL (ref 145–400)
RBC: 4.26 10*6/uL (ref 3.70–5.45)
RDW: 15.2 % — ABNORMAL HIGH (ref 11.2–14.5)
WBC: 4.3 10*3/uL (ref 3.9–10.3)
lymph#: 0.8 10*3/uL — ABNORMAL LOW (ref 0.9–3.3)

## 2016-04-06 LAB — COMPREHENSIVE METABOLIC PANEL
ALK PHOS: 48 U/L (ref 40–150)
ALT: 18 U/L (ref 0–55)
AST: 18 U/L (ref 5–34)
Albumin: 3.8 g/dL (ref 3.5–5.0)
Anion Gap: 9 mEq/L (ref 3–11)
BUN: 14.6 mg/dL (ref 7.0–26.0)
CHLORIDE: 108 meq/L (ref 98–109)
CO2: 25 meq/L (ref 22–29)
Calcium: 9.9 mg/dL (ref 8.4–10.4)
Creatinine: 1.1 mg/dL (ref 0.6–1.1)
EGFR: 52 mL/min/{1.73_m2} — AB (ref >90)
GLUCOSE: 125 mg/dL (ref 70–140)
POTASSIUM: 4.1 meq/L (ref 3.5–5.1)
SODIUM: 142 meq/L (ref 136–145)
Total Bilirubin: 0.31 mg/dL (ref 0.20–1.20)
Total Protein: 6.9 g/dL (ref 6.4–8.3)

## 2016-04-06 LAB — TSH: TSH: 3.823 m(IU)/L (ref 0.308–3.960)

## 2016-04-06 MED ORDER — ACETAMINOPHEN 325 MG PO TABS
650.0000 mg | ORAL_TABLET | Freq: Once | ORAL | Status: AC
  Administered 2016-04-06: 650 mg via ORAL

## 2016-04-06 MED ORDER — SODIUM CHLORIDE 0.9% FLUSH
10.0000 mL | INTRAVENOUS | Status: DC | PRN
  Administered 2016-04-06: 10 mL
  Filled 2016-04-06: qty 10

## 2016-04-06 MED ORDER — SODIUM CHLORIDE 0.9 % IV SOLN
Freq: Once | INTRAVENOUS | Status: AC
  Administered 2016-04-06: 11:00:00 via INTRAVENOUS

## 2016-04-06 MED ORDER — HEPARIN SOD (PORK) LOCK FLUSH 100 UNIT/ML IV SOLN
500.0000 [IU] | Freq: Once | INTRAVENOUS | Status: AC | PRN
  Administered 2016-04-06: 500 [IU]
  Filled 2016-04-06: qty 5

## 2016-04-06 MED ORDER — TRASTUZUMAB CHEMO 150 MG IV SOLR
6.0000 mg/kg | Freq: Once | INTRAVENOUS | Status: AC
  Administered 2016-04-06: 483 mg via INTRAVENOUS
  Filled 2016-04-06: qty 23

## 2016-04-06 MED ORDER — ACETAMINOPHEN 325 MG PO TABS
ORAL_TABLET | ORAL | Status: AC
  Filled 2016-04-06: qty 2

## 2016-04-06 MED ORDER — DIPHENHYDRAMINE HCL 50 MG/ML IJ SOLN
INTRAMUSCULAR | Status: AC
Start: 2016-04-06 — End: 2016-04-06
  Filled 2016-04-06: qty 1

## 2016-04-06 MED ORDER — DIPHENHYDRAMINE HCL 50 MG/ML IJ SOLN
25.0000 mg | Freq: Once | INTRAMUSCULAR | Status: AC
  Administered 2016-04-06: 25 mg via INTRAVENOUS

## 2016-04-06 NOTE — Patient Instructions (Signed)
[facility] Discharge Instructions for Patients Receiving Chemotherapy  Today you received the following chemotherapy agents: Herceptin   To help prevent nausea and vomiting after your treatment, we encourage you to take your nausea medication as directed.    If you develop nausea and vomiting that is not controlled by your nausea medication, call the clinic.   BELOW ARE SYMPTOMS THAT SHOULD BE REPORTED IMMEDIATELY:  *FEVER GREATER THAN 100.5 F  *CHILLS WITH OR WITHOUT FEVER  NAUSEA AND VOMITING THAT IS NOT CONTROLLED WITH YOUR NAUSEA MEDICATION  *UNUSUAL SHORTNESS OF BREATH  *UNUSUAL BRUISING OR BLEEDING  TENDERNESS IN MOUTH AND THROAT WITH OR WITHOUT PRESENCE OF ULCERS  *URINARY PROBLEMS  *BOWEL PROBLEMS  UNUSUAL RASH Items with * indicate a potential emergency and should be followed up as soon as possible.  Feel free to call the clinic you have any questions or concerns. The clinic phone number is [phone].  Please show the CHEMO ALERT CARD at check-in to the Emergency Department and triage nurse.   

## 2016-04-07 LAB — T4, FREE: FREE T4: 1.11 ng/dL (ref 0.82–1.77)

## 2016-04-07 LAB — T4: Thyroxine (T4): 7.2 ug/dL (ref 4.5–12.0)

## 2016-04-07 LAB — T3 UPTAKE
Free Thyroxine Index: 1.4 (ref 1.2–4.9)
T3 Uptake Ratio: 20 % — ABNORMAL LOW (ref 24–39)

## 2016-04-26 ENCOUNTER — Other Ambulatory Visit: Payer: Self-pay | Admitting: Adult Health

## 2016-04-26 DIAGNOSIS — Z170 Estrogen receptor positive status [ER+]: Principal | ICD-10-CM

## 2016-04-26 DIAGNOSIS — C50511 Malignant neoplasm of lower-outer quadrant of right female breast: Secondary | ICD-10-CM

## 2016-04-26 DIAGNOSIS — C50412 Malignant neoplasm of upper-outer quadrant of left female breast: Secondary | ICD-10-CM

## 2016-04-27 ENCOUNTER — Encounter: Payer: Self-pay | Admitting: Pharmacist

## 2016-04-27 ENCOUNTER — Ambulatory Visit (HOSPITAL_BASED_OUTPATIENT_CLINIC_OR_DEPARTMENT_OTHER): Payer: Medicare Other

## 2016-04-27 ENCOUNTER — Telehealth: Payer: Self-pay | Admitting: *Deleted

## 2016-04-27 ENCOUNTER — Other Ambulatory Visit (HOSPITAL_BASED_OUTPATIENT_CLINIC_OR_DEPARTMENT_OTHER): Payer: Medicare Other

## 2016-04-27 VITALS — BP 144/72 | HR 70 | Temp 98.2°F | Resp 16

## 2016-04-27 DIAGNOSIS — C50411 Malignant neoplasm of upper-outer quadrant of right female breast: Secondary | ICD-10-CM

## 2016-04-27 DIAGNOSIS — C50511 Malignant neoplasm of lower-outer quadrant of right female breast: Secondary | ICD-10-CM

## 2016-04-27 DIAGNOSIS — Z170 Estrogen receptor positive status [ER+]: Principal | ICD-10-CM

## 2016-04-27 DIAGNOSIS — C50412 Malignant neoplasm of upper-outer quadrant of left female breast: Secondary | ICD-10-CM

## 2016-04-27 DIAGNOSIS — Z5112 Encounter for antineoplastic immunotherapy: Secondary | ICD-10-CM | POA: Diagnosis present

## 2016-04-27 LAB — CBC WITH DIFFERENTIAL/PLATELET
BASO%: 0.9 % (ref 0.0–2.0)
BASOS ABS: 0 10*3/uL (ref 0.0–0.1)
EOS%: 9.7 % — ABNORMAL HIGH (ref 0.0–7.0)
Eosinophils Absolute: 0.4 10*3/uL (ref 0.0–0.5)
HEMATOCRIT: 37.1 % (ref 34.8–46.6)
HEMOGLOBIN: 12.5 g/dL (ref 11.6–15.9)
LYMPH%: 17.4 % (ref 14.0–49.7)
MCH: 30.9 pg (ref 25.1–34.0)
MCHC: 33.8 g/dL (ref 31.5–36.0)
MCV: 91.3 fL (ref 79.5–101.0)
MONO#: 0.5 10*3/uL (ref 0.1–0.9)
MONO%: 11.6 % (ref 0.0–14.0)
NEUT#: 2.6 10*3/uL (ref 1.5–6.5)
NEUT%: 60.4 % (ref 38.4–76.8)
Platelets: 151 10*3/uL (ref 145–400)
RBC: 4.06 10*6/uL (ref 3.70–5.45)
RDW: 14.5 % (ref 11.2–14.5)
WBC: 4.3 10*3/uL (ref 3.9–10.3)
lymph#: 0.7 10*3/uL — ABNORMAL LOW (ref 0.9–3.3)

## 2016-04-27 LAB — COMPREHENSIVE METABOLIC PANEL
ALBUMIN: 3.5 g/dL (ref 3.5–5.0)
ALK PHOS: 45 U/L (ref 40–150)
ALT: 12 U/L (ref 0–55)
ANION GAP: 8 meq/L (ref 3–11)
AST: 15 U/L (ref 5–34)
BUN: 13.7 mg/dL (ref 7.0–26.0)
CALCIUM: 9.6 mg/dL (ref 8.4–10.4)
CHLORIDE: 108 meq/L (ref 98–109)
CO2: 26 mEq/L (ref 22–29)
Creatinine: 1.1 mg/dL (ref 0.6–1.1)
EGFR: 50 mL/min/{1.73_m2} — ABNORMAL LOW (ref >90)
Glucose: 119 mg/dl (ref 70–140)
POTASSIUM: 4.1 meq/L (ref 3.5–5.1)
Sodium: 141 mEq/L (ref 136–145)
Total Bilirubin: 0.26 mg/dL (ref 0.20–1.20)
Total Protein: 6.4 g/dL (ref 6.4–8.3)

## 2016-04-27 MED ORDER — TRASTUZUMAB CHEMO 150 MG IV SOLR
6.0000 mg/kg | Freq: Once | INTRAVENOUS | Status: AC
  Administered 2016-04-27: 483 mg via INTRAVENOUS
  Filled 2016-04-27: qty 23

## 2016-04-27 MED ORDER — ACETAMINOPHEN 325 MG PO TABS
ORAL_TABLET | ORAL | Status: AC
  Filled 2016-04-27: qty 2

## 2016-04-27 MED ORDER — SODIUM CHLORIDE 0.9% FLUSH
10.0000 mL | INTRAVENOUS | Status: DC | PRN
  Administered 2016-04-27: 10 mL
  Filled 2016-04-27: qty 10

## 2016-04-27 MED ORDER — HEPARIN SOD (PORK) LOCK FLUSH 100 UNIT/ML IV SOLN
500.0000 [IU] | Freq: Once | INTRAVENOUS | Status: AC | PRN
  Administered 2016-04-27: 500 [IU]
  Filled 2016-04-27: qty 5

## 2016-04-27 MED ORDER — SODIUM CHLORIDE 0.9 % IV SOLN
Freq: Once | INTRAVENOUS | Status: AC
  Administered 2016-04-27: 11:00:00 via INTRAVENOUS

## 2016-04-27 MED ORDER — ACETAMINOPHEN 325 MG PO TABS
650.0000 mg | ORAL_TABLET | Freq: Once | ORAL | Status: AC
Start: 2016-04-27 — End: 2016-04-27
  Administered 2016-04-27: 650 mg via ORAL

## 2016-04-27 MED ORDER — DIPHENHYDRAMINE HCL 50 MG/ML IJ SOLN
INTRAMUSCULAR | Status: AC
  Filled 2016-04-27: qty 1

## 2016-04-27 MED ORDER — DIPHENHYDRAMINE HCL 50 MG/ML IJ SOLN
25.0000 mg | Freq: Once | INTRAMUSCULAR | Status: AC
  Administered 2016-04-27: 25 mg via INTRAVENOUS

## 2016-04-27 NOTE — Progress Notes (Signed)
Consent documentation  Study code: [id number]  Met with patient during her infusion appointment on 04/27/16 at 11:00. Patient was alone for this visit. Provided an overview of the "Pharmacogenetic analysis of toxicities related to administration of taxanes in breast cancer patients" study.  Consent form was reviewed with the patient (reviewed the study purpose, patient's role, possible side effects, cost, risk, information protection) All of the patient's questions were answered and she agreed to participate in the study. A signed copy of the consent form was given to the patient. All eligibility criteria have been met and patient has been enrolled in the study.  Study sample was collected via buccal swab after consent was obtained. Patient was informed that the sample would be sent to the lab for processing after samples were collected from 25 patients and that it would take approximately 2 weeks for the lab to process that sample.   Met with the patient for approximately 15 minutes.  Darl Pikes, PharmD, [place] Clinical Pharmacist- Oncology Pharmacy Resident

## 2016-04-27 NOTE — Telephone Encounter (Signed)
  Oncology Nurse Navigator Documentation  Navigator Location: [facility] (04/27/16 1400)   )Navigator Encounter Type: Treatment (04/27/16 1400)                     Patient Visit Type: MedOnc (04/27/16 1400) Treatment Phase: Final Chemo TX (04/27/16 1400)     Interventions: Referrals (04/27/16 1400) Referrals: Survivorship (04/27/16 1400)                    Time Spent with Patient: 15 (04/27/16 1400)

## 2016-04-27 NOTE — Patient Instructions (Signed)
[facility] Discharge Instructions for Patients Receiving Chemotherapy  Today you received the following chemotherapy agents: Herceptin   To help prevent nausea and vomiting after your treatment, we encourage you to take your nausea medication as directed.    If you develop nausea and vomiting that is not controlled by your nausea medication, call the clinic.   BELOW ARE SYMPTOMS THAT SHOULD BE REPORTED IMMEDIATELY:  *FEVER GREATER THAN 100.5 F  *CHILLS WITH OR WITHOUT FEVER  NAUSEA AND VOMITING THAT IS NOT CONTROLLED WITH YOUR NAUSEA MEDICATION  *UNUSUAL SHORTNESS OF BREATH  *UNUSUAL BRUISING OR BLEEDING  TENDERNESS IN MOUTH AND THROAT WITH OR WITHOUT PRESENCE OF ULCERS  *URINARY PROBLEMS  *BOWEL PROBLEMS  UNUSUAL RASH Items with * indicate a potential emergency and should be followed up as soon as possible.  Feel free to call the clinic you have any questions or concerns. The clinic phone number is [phone].  Please show the CHEMO ALERT CARD at check-in to the Emergency Department and triage nurse.   

## 2016-04-28 ENCOUNTER — Encounter (HOSPITAL_COMMUNITY): Payer: Self-pay | Admitting: Internal Medicine

## 2016-04-28 ENCOUNTER — Ambulatory Visit (HOSPITAL_COMMUNITY)
Admission: RE | Admit: 2016-04-28 | Discharge: 2016-04-28 | Disposition: A | Discharge status: Home / Self Care | Payer: Medicare Other | Source: Ambulatory Visit | Attending: Family Medicine | Admitting: Family Medicine

## 2016-04-28 ENCOUNTER — Ambulatory Visit (HOSPITAL_BASED_OUTPATIENT_CLINIC_OR_DEPARTMENT_OTHER)
Admission: RE | Admit: 2016-04-28 | Discharge: 2016-04-28 | Disposition: A | Discharge status: Home / Self Care | Payer: Medicare Other | Source: Ambulatory Visit | Attending: Internal Medicine | Admitting: Internal Medicine

## 2016-04-28 VITALS — BP 140/80 | HR 70 | Wt 176.2 lb

## 2016-04-28 DIAGNOSIS — C50412 Malignant neoplasm of upper-outer quadrant of left female breast: Secondary | ICD-10-CM | POA: Insufficient documentation

## 2016-04-28 NOTE — Progress Notes (Signed)
  Echocardiogram 2D Echocardiogram has been performed.  Isabella Cook 04/28/2016, 10:32 AM

## 2016-04-28 NOTE — Progress Notes (Signed)
Patient ID: Isabella Cook, female   DOB: 06-04-1940, 76 y.o.   MRN: [id number]    Advanced Heart Failure/ Cardio-Oncology Clinic Note   Oncology: Dr. Shonna Chock is a 76 yo woman with h/o GERD and  bilateral breast cancer, diagnosed 2/17.  ER+/PR+/HER2+.    On 03/25/2015 the patient underwent bilateral breast MRI, which showed the breast density to be category B. this showed, in the left breast, an area of non-masslike enhancement measuring 7.8 cm. Associated with this were 2 adjacent irregular masses measuring 1.7 and 1.4 cm. The masses were 5 cm anterior and superior to the biopsy clip. There was a cortically thickened left axillary lymph node noted.  Underwent neoadjuvant chemotherapy consisting of weekly paclitaxel 12 together with weekly trastuzumab started 04/29/2015. 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 Carboplatin/gemcitabine substituted, starting 06/10/2015 (total of 4 doses [2 D1, D8 cycles]   She presents today for follow up with Echo. Has finished Herceptin. Remains very active. Digging ditches in her yard. No HF symptoms    Echo 04/25/15 LVEF 55-60%, Grade 1 DD, Lat s' 11.5, GLS -19.1% Echo 07/28/15 LVEF 55-60%, Grade 1 DD, Lat s' 11.3, GLS -14.3% (Underestimated due to poor tracking) Echo 10/20/15 LVEF 60-65%, Grade 1 DD, Lat s' 10.6, GLS -19.6% Echo 04/28/16 LVEF 60-65% Grade 1 DD LS'  10.1 GLS -16.1%  PMH: 1. GERD 2. Breast cancer: Bilateral.  Diagnosed 2/17.  ER+/PR+/HER2+.  Plan for weekly paclitaxel + Herceptin x 12 wks then Herceptin every 3 weeks x 9 more months.  Surgery after chemo.  - Echo (4/17) with EF 55-60%, mild LVH, normal RV size and systolic function, lateral s' 11.5 cm/sec, GLS -19.1%.   Social History   Social History  . Marital status: Married    Spouse name: N/A  . Number of children: N/A  . Years of education: N/A   Occupational History  . Not on file.   Social History Main Topics  . Smoking status: Never Smoker  .  Smokeless tobacco: Never Used  . Alcohol use Yes     Comment: occ 1 every 2-3 months  . Drug use: No  . Sexual activity: Not on file   Other Topics Concern  . Not on file   Social History Narrative  . No narrative on file   FH: Mother with CHF, uncertain cause.   ROS: All systems reviewed and negative except as per HPI.   Current Outpatient Prescriptions  Medication Sig Dispense Refill  . Biotin 5000 MCG CAPS Take 5,000 mcg by mouth.    . calcium carbonate (TUMS - DOSED IN MG ELEMENTAL CALCIUM) 500 MG chewable tablet Chew 500 mg by mouth daily as needed for indigestion or heartburn.     . diphenhydrAMINE (SOMINEX) 25 MG tablet Take 25 mg by mouth at bedtime as needed. Reported on 05/20/2015    . estradiol (ESTRING) 2 MG vaginal ring Place 2 mg vaginally every 3 (three) months. follow package directions    . famotidine (PEPCID) 20 MG tablet Take 20 mg by mouth daily.     Marland Kitchen lidocaine-prilocaine (EMLA) cream Apply to affected area once    . Liothyronine Sodium POWD 5 mcg by Does not apply route.    . loratadine (CLARITIN) 10 MG tablet Take 10 mg by mouth daily.    . Magnesium Citrate 100 MG TABS Take 200 mg by mouth daily.    . Methylcellulose, Laxative, (CITRUCEL) 500 MG TABS Take 2,000 mg by mouth 2 (two) times daily.    Marland Kitchen  naproxen sodium (ANAPROX) 220 MG tablet Take 440 mg by mouth 3 (three) times daily with meals as needed.     Vladimir Faster Glycol-Propyl Glycol (SYSTANE ULTRA) 0.4-0.3 % SOLN Place 1 drop into both eyes daily as needed (dry eyes).    . Probiotic Product (ALIGN PO) Take 1 tablet by mouth daily.     . tamoxifen (NOLVADEX) 20 MG tablet Take 20 mg by mouth daily.    Marland Kitchen VITAMIN D-VITAMIN K PO Take 1 capsule by mouth daily. Reported on 04/15/2015     No current facility-administered medications for this encounter.    Vitals:   04/28/16 1147  BP: 140/80  Pulse: 70  SpO2: 100%  Weight: 176 lb 4 oz (79.9 kg)   Wt Readings from Last 3 Encounters:  04/28/16 176 lb 4 oz  (79.9 kg)  03/16/16 174 lb (78.9 kg)  12/23/15 168 lb 4.8 oz (76.3 kg)    General:  Well appearing. No resp difficulty HEENT: normal Neck: supple. no JVD. RIJ port  Carotids 2+ bilat; no bruits. No lymphadenopathy or thryomegaly appreciated. Cor: PMI nondisplaced. Regular rate & rhythm. Soft SEM LUSB  Lungs: clear Abdomen: soft, nontender, nondistended. No hepatosplenomegaly. No bruits or masses. Good bowel sounds. Extremities: no cyanosis, clubbing, rash, trace edema Neuro: alert & orientedx3, cranial nerves grossly intact. moves all 4 extremities w/o difficulty. Affect pleasant  Assessment/plan: 1. Bilateral breast cancer, ER+/PR+/HER2+. S/p lumpectomy - I reviewed echos personally. EF and Doppler parameters stable. No HF on exam. She has completed Herceptin. Cam f/u PRN.   Glori Bickers MD 04/28/2016

## 2016-05-18 ENCOUNTER — Other Ambulatory Visit (HOSPITAL_BASED_OUTPATIENT_CLINIC_OR_DEPARTMENT_OTHER): Payer: Medicare Other

## 2016-05-18 ENCOUNTER — Ambulatory Visit (HOSPITAL_BASED_OUTPATIENT_CLINIC_OR_DEPARTMENT_OTHER): Payer: Medicare Other | Admitting: Oncology

## 2016-05-18 VITALS — BP 139/75 | HR 77 | Temp 97.7°F | Resp 20 | Ht 63.5 in | Wt 174.7 lb

## 2016-05-18 DIAGNOSIS — Z170 Estrogen receptor positive status [ER+]: Secondary | ICD-10-CM | POA: Diagnosis not present

## 2016-05-18 DIAGNOSIS — C50411 Malignant neoplasm of upper-outer quadrant of right female breast: Secondary | ICD-10-CM

## 2016-05-18 DIAGNOSIS — N951 Menopausal and female climacteric states: Secondary | ICD-10-CM

## 2016-05-18 DIAGNOSIS — R609 Edema, unspecified: Secondary | ICD-10-CM

## 2016-05-18 DIAGNOSIS — N898 Other specified noninflammatory disorders of vagina: Secondary | ICD-10-CM | POA: Diagnosis not present

## 2016-05-18 DIAGNOSIS — C50412 Malignant neoplasm of upper-outer quadrant of left female breast: Secondary | ICD-10-CM | POA: Diagnosis present

## 2016-05-18 DIAGNOSIS — R911 Solitary pulmonary nodule: Secondary | ICD-10-CM

## 2016-05-18 DIAGNOSIS — C50511 Malignant neoplasm of lower-outer quadrant of right female breast: Secondary | ICD-10-CM

## 2016-05-18 DIAGNOSIS — M8580 Other specified disorders of bone density and structure, unspecified site: Secondary | ICD-10-CM

## 2016-05-18 LAB — COMPREHENSIVE METABOLIC PANEL
ALBUMIN: 3.7 g/dL (ref 3.5–5.0)
ALK PHOS: 42 U/L (ref 40–150)
ALT: 17 U/L (ref 0–55)
AST: 18 U/L (ref 5–34)
Anion Gap: 10 mEq/L (ref 3–11)
BILIRUBIN TOTAL: 0.22 mg/dL (ref 0.20–1.20)
BUN: 13.6 mg/dL (ref 7.0–26.0)
CO2: 26 meq/L (ref 22–29)
CREATININE: 1 mg/dL (ref 0.6–1.1)
Calcium: 9.7 mg/dL (ref 8.4–10.4)
Chloride: 107 mEq/L (ref 98–109)
EGFR: 55 mL/min/{1.73_m2} — ABNORMAL LOW (ref >90)
GLUCOSE: 123 mg/dL (ref 70–140)
Potassium: 4.2 mEq/L (ref 3.5–5.1)
SODIUM: 143 meq/L (ref 136–145)
TOTAL PROTEIN: 6.8 g/dL (ref 6.4–8.3)

## 2016-05-18 LAB — CBC WITH DIFFERENTIAL/PLATELET
BASO%: 0.7 % (ref 0.0–2.0)
Basophils Absolute: 0 10*3/uL (ref 0.0–0.1)
EOS ABS: 0.5 10*3/uL (ref 0.0–0.5)
EOS%: 11 % — ABNORMAL HIGH (ref 0.0–7.0)
HCT: 37.5 % (ref 34.8–46.6)
HGB: 12.4 g/dL (ref 11.6–15.9)
LYMPH%: 18.3 % (ref 14.0–49.7)
MCH: 30.5 pg (ref 25.1–34.0)
MCHC: 33 g/dL (ref 31.5–36.0)
MCV: 92.4 fL (ref 79.5–101.0)
MONO#: 0.5 10*3/uL (ref 0.1–0.9)
MONO%: 12.7 % (ref 0.0–14.0)
NEUT%: 57.3 % (ref 38.4–76.8)
NEUTROS ABS: 2.4 10*3/uL (ref 1.5–6.5)
Platelets: 151 10*3/uL (ref 145–400)
RBC: 4.05 10*6/uL (ref 3.70–5.45)
RDW: 14.3 % (ref 11.2–14.5)
WBC: 4.3 10*3/uL (ref 3.9–10.3)
lymph#: 0.8 10*3/uL — ABNORMAL LOW (ref 0.9–3.3)

## 2016-05-18 NOTE — Progress Notes (Signed)
[place]  [phone] [phone]) [phone]   ID: Breean Nannini DOB: 07/27/1940  MR#: [id number]  ZLD#:[id number]  Patient Care Team: [name], [place] as PCP - General (Family Medicine) Mandy Fitzwater, Virgie Dad, MD as Consulting Physician (Oncology) [name], MD as Consulting Physician (General Surgery) [name], NP as Nurse Practitioner (Hematology and Oncology) Netanel Yannuzzi, Virgie Dad, MD as Consulting Physician (Oncology) Thea Silversmith, MD (Inactive) as Consulting Physician (Radiation Oncology) [name], MD as Consulting Physician (Neurosurgery) [name], MD as Referring Physician (Otolaryngology) Bensimhon, Shaune Pascal, MD as Consulting Physician (Cardiology) PCP: [name], FNP OTHER MD:  CHIEF COMPLAINT: HER-2 positive breast cancer, bilateral beast cancers  CURRENT TREATMENT: Tamoxifen   BREAST CANCER HISTORY:   From the original intake note:  Airyana had routine screening mammography at [facility] suggesting an area of indeterminate microcalcifications in the upper outer quadrant of the left breast measuring 8 mm. She was referred for left breast stereotactic biopsy performed at the [facility] 02/27/2015. This showed ([id number]) invasive ductal carcinoma, grade 2, estrogen receptor 100% positive, progesterone receptor 100% positive, both with strong staining intensity, with an MIB-1 of 20%, and HER-2 amplified with a signals ratio of 2.53, the number per cell being 2.65. 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  On 03/25/2015 the patient underwent bilateral breast MRI, which showed the breast density to be category B. this showed, in the left breast, an area of non-masslike enhancement measuring 7.8 cm. Associated with this were 2 adjacent irregular masses measuring 1.7 and 1.4 cm. The masses were 5 cm anterior and superior to the biopsy clip. There was a cortically thickened left axillary lymph node  noted.  Also in the right breast there was a 7 mm irregular enhancing mass at the 6:30 o'clock position.  Her subsequent history is as detailed below    INTERVAL HISTORY: Tasmine returns today for follow-up of her estrogen receptor positive breast cancer. She has completed her anti-HER-2 treatment and continues on tamoxifen alone. She tolerates this well. She has occasional hot flashes and mild vaginal wetness problems. She obtains a drug at practically no cost.  She is planning to have the port removed before the end of this month  She is interested in trying Astaxanthin and Quercetin, the latter being a flavonoid, the former a carotenoid. She wanted to know if I thought it would be safe or helpful for her to take these agents  REVIEW OF SYSTEMS: Cathe is not exercising regularly and unfortunately has no plans to do so. She has mild sinus symptoms. Occasionally her ankles swell, the left a little more than the right, but this is very intermittent. She has moderate heartburn. A detailed review of systems today was otherwise stable.  PAST MEDICAL HISTORY: Past Medical History:  Diagnosis Date  . Arthritis    wrists and knees  . Cancer ([facility]) 03-2015   left breast  . GERD (gastroesophageal reflux disease)   . History of hiatal hernia   . History of jaundice as a child   . History of radiation therapy completed 11/17/15   50 Gy to Right Breast, 60 Gy to Left Breast with regional nodal treatment on Left as well.   . Hypothyroidism   . Multiple food allergies   . Urinary tract infection     PAST SURGICAL HISTORY: Past Surgical History:  Procedure Laterality Date  . ABDOMINAL HYSTERECTOMY    . APPENDECTOMY    . BACK SURGERY     lumbar  .  BREAST LUMPECTOMY WITH RADIOACTIVE SEED AND SENTINEL LYMPH NODE BIOPSY Bilateral 08/07/2015   Procedure: LEFT BREAST LUMPECTOMY LOCALIZED WITH 2 RADIOACTIVE SEEDS, RIGHT BREAST SEED GUIDED LUMPECTOMY, BILATERAL SENTINEL LYMPH NODE MAPPING;  Surgeon:  [name], MD;  Location: [place];  Service: General;  Laterality: Bilateral;  . BREAST RECONSTRUCTION Bilateral 09/02/2015   Procedure: BILATERAL ONCOPLASTIC RECONSTRUCTION WITH MASTOPEXY;  Surgeon: [name], MD;  Location: [facility];  Service: Plastics;  Laterality: Bilateral;  . [facility]  2016  . CHOLECYSTECTOMY    . HERNIA REPAIR     UHR  . MASTOPEXY Bilateral 09/02/2015   Procedure: MASTOPEXY;  Surgeon: [name], MD;  Location: [place];  Service: Plastics;  Laterality: Bilateral;  . PORTACATH PLACEMENT Right 04/22/2015   Procedure: INSERTION PORT-A-CATH WITH [place];  Surgeon: [name], MD;  Location: [facility];  Service: General;  Laterality: Right;  . RE-EXCISION OF BREAST LUMPECTOMY Left 09/02/2015   Procedure: RE-EXCISION OF LEFT BREAST LUMPECTOMY;  Surgeon: [name], MD;  Location: [facility];  Service: General;  Laterality: Left;  . TUBAL LIGATION      FAMILY HISTORY No family history on file. The patient's father had a history of pseudo-bulbar pulse 69 and died from a stroke at age 47. The patient's mother died at age 70 from complications of emphysema. The patient had no brothers, 2 sisters. There is no history of cancer in the family and specifically no history of breast or ovarian cancer.  GYNECOLOGIC HISTORY:  No LMP recorded. Patient has had a hysterectomy. Menarche age 76, first live birth age 76. She is GX P2. She underwent a hysterectomy with left salpingo-oophorectomy in 1984. She received hormone replacement for approximately 15 years, until 2005.  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 Her husband [name] is a retired Social research officer, government. Daughter [name] lives in [place] where she is a housewife, homeschooling her children. Son Icie Kuznicki lives in [place] and works in Product manager. The patient has 2 grandchildren. She is not a church attender    ADVANCED DIRECTIVES: In place   HEALTH MAINTENANCE: Social History  Substance Use Topics  . Smoking status: Never Smoker  . Smokeless tobacco: Never Used  . Alcohol use Yes     Comment: occ 1 every 2-3 months     Colonoscopy:2014  PAP:  Bone density: 2017 [facility]//osteopenia   Lipid panel:  Allergies  Allergen Reactions  . Dairy Aid [Lactase] Anaphylaxis     Takes claritin every day and benadryl, if needed, to prevent anaphylaxis   . Eggs Or Egg-Derived Products Anaphylaxis    Nasal stuffiness, Takes claritin every day and benadryl, if needed, to prevent anaphylaxis  . Gelatin (Bovine) [Beef Extract] Anaphylaxis    Muscle pain,  Takes claritin every day and benadryl, if needed, to prevent anaphylaxis   . Gluten Meal Anaphylaxis and Diarrhea    Takes claritin every day and benadryl, if needed, to prevent anaphylaxis   . Lambs Quarters Anaphylaxis    ALL "mammal" MEAT per pt -- Muscle pain  Can eat chicken, fish, [place]  . Milk-Related Compounds Anaphylaxis    Nasal stuffiness Cheese (mozzarella, swiss, cheddar, cottage)  . Pork Allergy Anaphylaxis    ALL "mammal" MEAT per pt -- Muscle pain  Can eat chicken, fish, [place]  . Wheat Bran Anaphylaxis and Diarrhea     Takes claritin  every day and benadryl, if needed, to prevent anaphylaxis   . Whey Anaphylaxis and Diarrhea     Takes claritin every day and benadryl, if needed, to prevent anaphylaxis   . Yeast Anaphylaxis     Takes claritin every day and benadryl, if needed, to prevent anaphylaxis  . Soy Allergy Diarrhea  . Almond Oil Nausea And Vomiting    Current Outpatient Prescriptions  Medication Sig Dispense Refill  . Biotin 5000 MCG CAPS Take 5,000 mcg by mouth.    . calcium carbonate (TUMS - DOSED IN MG ELEMENTAL CALCIUM) 500 MG chewable tablet Chew 500 mg by mouth daily as needed for indigestion or heartburn.     . diphenhydrAMINE  (SOMINEX) 25 MG tablet Take 25 mg by mouth at bedtime as needed. Reported on 05/20/2015    . estradiol (ESTRING) 2 MG vaginal ring Place 2 mg vaginally every 3 (three) months. follow package directions    . famotidine (PEPCID) 20 MG tablet Take 20 mg by mouth daily.     Marland Kitchen lidocaine-prilocaine (EMLA) cream Apply to affected area once    . Liothyronine Sodium POWD 5 mcg by Does not apply route.    . loratadine (CLARITIN) 10 MG tablet Take 10 mg by mouth daily.    . Magnesium Citrate 100 MG TABS Take 200 mg by mouth daily.    . Methylcellulose, Laxative, (CITRUCEL) 500 MG TABS Take 2,000 mg by mouth 2 (two) times daily.    . naproxen sodium (ANAPROX) 220 MG tablet Take 440 mg by mouth 3 (three) times daily with meals as needed.     Vladimir Faster Glycol-Propyl Glycol (SYSTANE ULTRA) 0.4-0.3 % SOLN Place 1 drop into both eyes daily as needed (dry eyes).    . Probiotic Product (ALIGN PO) Take 1 tablet by mouth daily.     . tamoxifen (NOLVADEX) 20 MG tablet Take 20 mg by mouth daily.    Marland Kitchen VITAMIN D-VITAMIN K PO Take 1 capsule by mouth daily. Reported on 04/15/2015     No current facility-administered medications for this visit.     OBJECTIVE: Middle-aged white woman  In no acute distress  Vitals:   05/18/16 1324  BP: 139/75  Pulse: 77  Resp: 20  Temp: 97.7 F (36.5 C)     Body mass index is 30.46 kg/m.    ECOG FS:0 - Asymptomatic  Sclerae unicteric, pupils round and equal Oropharynx clear and moist No cervical or supraclavicular adenopathy Lungs no rales or rhonchi Heart regular rate and rhythm Abd soft, nontender, positive bowel sounds MSK no focal spinal tenderness, no upper extremity lymphedema Neuro: nonfocal, well oriented, appropriate affect Breasts: Status post bilateral reduction mammoplasties, and also status post bilateral lumpectomies and bilateral radiation. She has recovered very nicely from these treatments. The cosmetic result is excellent. There is no evidence of local  recurrence. Both axillae are benign.   LAB RESULTS:  CMP     Component Value Date/Time   NA 141 04/27/2016 1007   K 4.1 04/27/2016 1007   CO2 26 04/27/2016 1007   GLUCOSE 119 04/27/2016 1007   BUN 13.7 04/27/2016 1007   CREATININE 1.1 04/27/2016 1007   CALCIUM 9.6 04/27/2016 1007   PROT 6.4 04/27/2016 1007   ALBUMIN 3.5 04/27/2016 1007   AST 15 04/27/2016 1007   ALT 12 04/27/2016 1007   ALKPHOS 45 04/27/2016 1007   BILITOT 0.26 04/27/2016 1007    INo results found for: SPEP, UPEP  Lab Results  Component Value Date  WBC 4.3 05/18/2016   NEUTROABS 2.4 05/18/2016   HGB 12.4 05/18/2016   HCT 37.5 05/18/2016   MCV 92.4 05/18/2016   PLT 151 05/18/2016      Chemistry      Component Value Date/Time   NA 141 04/27/2016 1007   K 4.1 04/27/2016 1007   CO2 26 04/27/2016 1007   BUN 13.7 04/27/2016 1007   CREATININE 1.1 04/27/2016 1007      Component Value Date/Time   CALCIUM 9.6 04/27/2016 1007   ALKPHOS 45 04/27/2016 1007   AST 15 04/27/2016 1007   ALT 12 04/27/2016 1007   BILITOT 0.26 04/27/2016 1007       No results found for: LABCA2  No components found for: LABCA125  No results for input(s): INR in the last 168 hours.  Urinalysis    Component Value Date/Time   LABSPEC 1.005 12/12/2015 1410   PHURINE 6.0 12/12/2015 1410   GLUCOSEU Negative 12/12/2015 1410   HGBUR Negative 12/12/2015 1410   BILIRUBINUR Negative 12/12/2015 1410   KETONESUR Negative 12/12/2015 1410   PROTEINUR Negative 12/12/2015 1410   UROBILINOGEN 0.2 12/12/2015 1410   NITRITE Negative 12/12/2015 1410   LEUKOCYTESUR Small 12/12/2015 1410     ELIGIBLE FOR AVAILABLE RESEARCH PROTOCOL: not a PALLAS trial candidate as HER-2 positive   STUDIES: No results found.   ASSESSMENT: 76 y.o. [facility] woman  status post left breast upper outer quadrant biopsy 02/27/2015 for a clinical mT1-3 N1 invasive ductal carcinoma, grade 2, strongly estrogen and progesterone receptor positive, HER-2  amplified, with an MIB-1 of 20%.   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  (b) right breast lower outer quadrant: a clinical T1b N0, stage IA invasive ductal carcinoma, estrogen and progesterone receptor positive, HER-2 not amplified, with an Mib-1 of 4%  (2)  neoadjuvant chemotherapy consisting of weekly paclitaxel 12 together with weekly trastuzumab started 04/29/2015  (a) 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  (4) on 08/07/2015 she underwent  (a) right lumpectomy and sentinel lymph node sampling showing ypT0 ypN0 (complete pathologic response)  (b) two left lumpectomies, for ympT2 ypN1 invasive ductal carcinoma, grade 1, with positive margins.  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 The Right breast was treated to 50 Gy in 25 fractions at 2 Gy per fraction. 2. The Left breast (4-field) was treated to 50 Gy in 25 fractions at 2 Gy per fraction. 3. The Left breast was boosted to 10 Gy in 5 fractions at 2 Gy per fraction.   (7). Tamoxifen started 12/23/2015 --  (a) discontinued Estring as of 05/05/2015, resumed January 2018 under cover of  tamoxifen  (8) incidentally noted left lower lobe 0.7 cm nodule on 07/10/2015 MRI ,  requiring eventual follow-up  (a) repeat CT scan of the chest 12/12/2015 showed the nodule unchanged (0.6 cm)  (b) repeat CT scan mid 2019 suggested per radiology   PLAN: Adilee will soon be 1 year out from her definitive surgery, with no evidence of disease recurrence or activity. This is favorable.  She continues on tamoxifen, the plan being for a minimum of 5 years on that drug.  We spent approximately 30 minutes going over some of the alternative medications she is intending to take. These include carotenoids and chemicals drive from plant pigments. We stressed the fact that these products are sold as foods but promoted as medications. There are no formal studies on any of these drugs, which are used because of theoretical anecdotal promotions. We have evidence that carotenoids for example can promote lung cancer in patients susceptible to that that disease. Accordingly while I do not have any evidence that the particular drugs she wants to take are harmful, I also do not know that they are safe. She may take them of course at her own discretion.  From my point of view I am delighted at how well she is doing overall and she will see me again in mid September. From that point I will start seeing her every 6 months for the next year and then yearly.  She knows to call for any problems that may develop before her next visit here.      [name], MD   05/18/2016 1:32 PM

## 2016-06-07 ENCOUNTER — Ambulatory Visit: Payer: Self-pay | Admitting: Surgery

## 2016-06-07 NOTE — H&P (Signed)
Isabella Cook 06/07/2016 10:44 AM Location: [facility] Patient #: [id number] DOB: 10-24-1940 Married / Language: Isabella Cook / Race: White Female  History of Present Illness Isabella Moores A. Meilin Brosh MD; 06/07/2016 11:32 AM) Patient words: Patient returns for follow-up after breast conserving surgery in 2017 for breast cancer. She's completing chemotherapy. She would like to have her port removed. Overall, she is finished with chemotherapy and radiation therapy and has done well.               per oncology    76 y.o. 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 (a) echo 07/24//2017 shows an ejection fraction of 55-60%  (4) on 08/07/2015 she underwent (a) right lumpectomy and  sentinel lymph node sampling showing ypT0 ypN0 (complete pathologic response) (b) two left lumpectomies, for ympT2 ypN1 invasive ductal carcinoma, grade 1, with positive margins.  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  The patient is a 76 year old female.   Allergies Isabella Cook, RMA; 06/07/2016 10:44 AM) BEEF Gelatin *Assorted Classes** any medication that is gelatin based  Medication History Isabella Cook, RMA; 06/07/2016 10:45 AM) Herceptin (440MG For Solution, Intravenous) Active. Claritin (10MG Tablet, Oral) Active. Methylcellulose (Laxative) (475MG Tablet, Oral) Active. Estring (2MG Ring, Vaginal) Active. Tums 500 (1250MG Tablet Chewable, Oral) Active. Pepcid (20MG Tablet, Oral) Active. Probiotic Acidophilus (Oral) Active. Vitamin D (Cholecalciferol) (1000UNIT Capsule, Oral) Active. Magnesium Citrate (100MG Tablet, Oral) Active. Besivance (0.6% Suspension, Ophthalmic) Active. Lotemax (0.5% Gel, Ophthalmic) Active. Prolensa (0.07% Solution, Ophthalmic) Active. Tamoxifen Citrate (20MG Tablet, Oral) Active. Medications Reconciled    Vitals Isabella Cook RMA; 06/07/2016 10:45 AM) 06/07/2016 10:45 AM Weight: 176.2 lb Height: 63.5in Body Surface Area: 1.84 m Body Mass Index: 30.72 kg/m  Temp.: 98.30F  Pulse: 93 (Regular)  BP: 130/84 (Sitting, Left Arm, Standard)      Physical Exam (Isabella Cook A. Isabella Borges MD; 06/07/2016 11:33 AM)  General Mental  Status-Alert. General Appearance-Consistent with stated age. Hydration-Well hydrated. Voice-Normal.  Chest and Lung Exam Note: Port site right upper chest wall noted. No signs of infection and  intact.  Neurologic Neurologic evaluation reveals -alert and oriented x 3 with no impairment of recent or remote memory. Mental Status-Normal.  Musculoskeletal Normal Exam - Left-Upper Extremity Strength Normal and Lower Extremity Strength Normal. Normal Exam - Right-Upper Extremity Strength Normal and Lower Extremity Strength Normal.    Assessment & Plan (Isabella Cook A. Isabella Jahr MD; 06/07/2016 11:33 AM)  HISTORY OF BREAST CANCER (Z85.3) Impression: doing well remove port   NAD  POST-OPERATIVE STATE [phone]) Impression: remove port  Current Plans I recommended surgery to remove the catheter. I explained the technique of removal with use of local anesthesia & possible need for more aggressive sedation/anesthesia for patient comfort.  Risks such as bleeding, infection, and other risks were discussed. Post-operative dressing/incision care was discussed. I noted a good likelihood this will help address the problem. We will work to minimize complications. Questions were answered. The patient expresses understanding & wishes to proceed with surgery.  Pt Education - [facility] Free Text Education/Instructions: discussed with patient and provided information.

## 2016-06-30 ENCOUNTER — Encounter: Payer: Self-pay | Admitting: Pharmacist

## 2016-06-30 DIAGNOSIS — C50511 Malignant neoplasm of lower-outer quadrant of right female breast: Secondary | ICD-10-CM

## 2016-06-30 DIAGNOSIS — Z170 Estrogen receptor positive status [ER+]: Secondary | ICD-10-CM

## 2016-06-30 NOTE — Progress Notes (Signed)
Telephone documentation  Study code: [id number]  Spoke with patient over the phone today and reviewed with her the pharmacogenetic information listed below for the four genes of interest of the "Pharmacogenetic analysis of toxicities related to administration of taxanes in breast cancer patients" study. The information below was reviewed with the patient. Offered the patient the option of speaking with a [facility] genetic counselor and she was not interested in making an appointment. Provide my office number to the patient if she had additional questions.  **The buccal swab testing was NOT conducted at a CLIA validated lab. The patient was reminded that the information given was for informational proposes only and should NOT be used to make clinical decisions.   Gene Phenotype  CYP3A4 Normal metabolizer   CYP3A5 Poor metabolizer   SLCO1B1 Normal function  ABCB1 Normal function    Darl Pikes, PharmD, [place] Clinical Pharmacist- Oncology Pharmacy Resident [phone]

## 2016-07-09 ENCOUNTER — Encounter (HOSPITAL_BASED_OUTPATIENT_CLINIC_OR_DEPARTMENT_OTHER): Payer: Self-pay | Admitting: *Deleted

## 2016-07-13 ENCOUNTER — Ambulatory Visit
Admission: RE | Admit: 2016-07-13 | Discharge: 2016-07-13 | Disposition: A | Discharge status: Home / Self Care | Payer: Medicare Other | Source: Ambulatory Visit | Attending: Oncology | Admitting: Oncology

## 2016-07-13 DIAGNOSIS — C50412 Malignant neoplasm of upper-outer quadrant of left female breast: Secondary | ICD-10-CM

## 2016-07-13 DIAGNOSIS — C50511 Malignant neoplasm of lower-outer quadrant of right female breast: Secondary | ICD-10-CM

## 2016-07-13 DIAGNOSIS — Z170 Estrogen receptor positive status [ER+]: Principal | ICD-10-CM

## 2016-07-13 NOTE — Progress Notes (Signed)
Boost drink given with instructions to complete by 0800 dos, pt verbalized understanding.

## 2016-07-19 ENCOUNTER — Ambulatory Visit (HOSPITAL_BASED_OUTPATIENT_CLINIC_OR_DEPARTMENT_OTHER): Payer: Medicare Other | Admitting: Anesthesiology

## 2016-07-19 ENCOUNTER — Encounter (HOSPITAL_BASED_OUTPATIENT_CLINIC_OR_DEPARTMENT_OTHER)
Admission: RE | Disposition: A | Discharge status: Home / Self Care | Payer: Self-pay | Source: Ambulatory Visit | Attending: Surgery

## 2016-07-19 ENCOUNTER — Encounter (HOSPITAL_BASED_OUTPATIENT_CLINIC_OR_DEPARTMENT_OTHER): Payer: Self-pay | Admitting: Anesthesiology

## 2016-07-19 ENCOUNTER — Ambulatory Visit (HOSPITAL_BASED_OUTPATIENT_CLINIC_OR_DEPARTMENT_OTHER)
Admission: RE | Admit: 2016-07-19 | Discharge: 2016-07-19 | Disposition: A | Discharge status: Home / Self Care | Payer: Medicare Other | Source: Ambulatory Visit | Attending: Surgery | Admitting: Surgery

## 2016-07-19 DIAGNOSIS — Z853 Personal history of malignant neoplasm of breast: Secondary | ICD-10-CM | POA: Diagnosis not present

## 2016-07-19 DIAGNOSIS — Z79810 Long term (current) use of selective estrogen receptor modulators (SERMs): Secondary | ICD-10-CM | POA: Diagnosis not present

## 2016-07-19 DIAGNOSIS — Z452 Encounter for adjustment and management of vascular access device: Secondary | ICD-10-CM | POA: Insufficient documentation

## 2016-07-19 DIAGNOSIS — Z79899 Other long term (current) drug therapy: Secondary | ICD-10-CM | POA: Insufficient documentation

## 2016-07-19 DIAGNOSIS — K219 Gastro-esophageal reflux disease without esophagitis: Secondary | ICD-10-CM | POA: Diagnosis not present

## 2016-07-19 DIAGNOSIS — M1990 Unspecified osteoarthritis, unspecified site: Secondary | ICD-10-CM | POA: Insufficient documentation

## 2016-07-19 DIAGNOSIS — Z9221 Personal history of antineoplastic chemotherapy: Secondary | ICD-10-CM | POA: Insufficient documentation

## 2016-07-19 HISTORY — PX: PORT-A-CATH REMOVAL: SHX5289

## 2016-07-19 SURGERY — REMOVAL PORT-A-CATH
Anesthesia: Monitor Anesthesia Care | Site: Chest | Laterality: Right

## 2016-07-19 MED ORDER — CEFAZOLIN SODIUM-DEXTROSE 2-3 GM-% IV SOLR
INTRAVENOUS | Status: DC | PRN
  Administered 2016-07-19: 2 g via INTRAVENOUS

## 2016-07-19 MED ORDER — ONDANSETRON HCL 4 MG/2ML IJ SOLN
INTRAMUSCULAR | Status: AC
  Filled 2016-07-19: qty 2

## 2016-07-19 MED ORDER — PROPOFOL 10 MG/ML IV BOLUS
INTRAVENOUS | Status: AC
  Filled 2016-07-19: qty 20

## 2016-07-19 MED ORDER — ONDANSETRON HCL 4 MG/2ML IJ SOLN
INTRAMUSCULAR | Status: DC | PRN
  Administered 2016-07-19: 4 mg via INTRAVENOUS

## 2016-07-19 MED ORDER — PROMETHAZINE HCL 25 MG/ML IJ SOLN: 6.2500 mg | INTRAMUSCULAR | Status: DC | PRN

## 2016-07-19 MED ORDER — OXYCODONE HCL 5 MG/5ML PO SOLN: 5.0000 mg | Freq: Once | ORAL | Status: DC | PRN

## 2016-07-19 MED ORDER — ACETAMINOPHEN 500 MG PO TABS
ORAL_TABLET | ORAL | Status: AC
  Filled 2016-07-19: qty 2

## 2016-07-19 MED ORDER — GABAPENTIN 300 MG PO CAPS
ORAL_CAPSULE | ORAL | Status: AC
  Filled 2016-07-19: qty 1

## 2016-07-19 MED ORDER — LIDOCAINE HCL (CARDIAC) 20 MG/ML IV SOLN
INTRAVENOUS | Status: AC
  Filled 2016-07-19: qty 5

## 2016-07-19 MED ORDER — FENTANYL CITRATE (PF) 100 MCG/2ML IJ SOLN: 50.0000 ug | INTRAMUSCULAR | Status: DC | PRN

## 2016-07-19 MED ORDER — BUPIVACAINE-EPINEPHRINE 0.5% -1:200000 IJ SOLN
INTRAMUSCULAR | Status: DC | PRN
  Administered 2016-07-19: 10 mL

## 2016-07-19 MED ORDER — CHLORHEXIDINE GLUCONATE CLOTH 2 % EX PADS: 6.0000 | MEDICATED_PAD | Freq: Once | CUTANEOUS | Status: DC

## 2016-07-19 MED ORDER — DEXTROSE 5 % IV SOLN: 3.0000 g | INTRAVENOUS | Status: DC

## 2016-07-19 MED ORDER — CELECOXIB 400 MG PO CAPS: 400.0000 mg | ORAL_CAPSULE | ORAL | Status: DC

## 2016-07-19 MED ORDER — SCOPOLAMINE 1 MG/3DAYS TD PT72: 1.0000 | MEDICATED_PATCH | Freq: Once | TRANSDERMAL | Status: DC | PRN

## 2016-07-19 MED ORDER — BUPIVACAINE-EPINEPHRINE (PF) 0.5% -1:200000 IJ SOLN
INTRAMUSCULAR | Status: AC
  Filled 2016-07-19: qty 60

## 2016-07-19 MED ORDER — FENTANYL CITRATE (PF) 100 MCG/2ML IJ SOLN
INTRAMUSCULAR | Status: AC
  Filled 2016-07-19: qty 2

## 2016-07-19 MED ORDER — GABAPENTIN 300 MG PO CAPS: 300.0000 mg | ORAL_CAPSULE | ORAL | Status: DC

## 2016-07-19 MED ORDER — CELECOXIB 200 MG PO CAPS
ORAL_CAPSULE | ORAL | Status: AC
  Filled 2016-07-19: qty 2

## 2016-07-19 MED ORDER — DEXAMETHASONE SODIUM PHOSPHATE 10 MG/ML IJ SOLN
INTRAMUSCULAR | Status: AC
  Filled 2016-07-19: qty 1

## 2016-07-19 MED ORDER — FENTANYL CITRATE (PF) 100 MCG/2ML IJ SOLN
INTRAMUSCULAR | Status: DC | PRN
  Administered 2016-07-19 (×5): 25 ug via INTRAVENOUS

## 2016-07-19 MED ORDER — CEFAZOLIN SODIUM-DEXTROSE 2-4 GM/100ML-% IV SOLN
INTRAVENOUS | Status: AC
  Filled 2016-07-19: qty 100

## 2016-07-19 MED ORDER — CEFAZOLIN SODIUM-DEXTROSE 2-4 GM/100ML-% IV SOLN: 2.0000 g | Freq: Once | INTRAVENOUS | Status: DC

## 2016-07-19 MED ORDER — HYDROMORPHONE HCL 1 MG/ML IJ SOLN: 0.2500 mg | INTRAMUSCULAR | Status: DC | PRN

## 2016-07-19 MED ORDER — OXYCODONE HCL 5 MG PO TABS: 5.0000 mg | ORAL_TABLET | Freq: Once | ORAL | Status: DC | PRN

## 2016-07-19 MED ORDER — LACTATED RINGERS IV SOLN
INTRAVENOUS | Status: DC
  Administered 2016-07-19: 10:00:00 via INTRAVENOUS

## 2016-07-19 MED ORDER — ACETAMINOPHEN 500 MG PO TABS
1000.0000 mg | ORAL_TABLET | ORAL | Status: AC
  Administered 2016-07-19: 1000 mg via ORAL

## 2016-07-19 MED ORDER — MIDAZOLAM HCL 2 MG/2ML IJ SOLN: 1.0000 mg | INTRAMUSCULAR | Status: DC | PRN

## 2016-07-19 SURGICAL SUPPLY — 32 items
BENZOIN TINCTURE PRP APPL 2/3 (GAUZE/BANDAGES/DRESSINGS) IMPLANT
BLADE SURG 15 STRL LF DISP TIS (BLADE) ×1 IMPLANT
BLADE SURG 15 STRL SS (BLADE) ×1
CHLORAPREP W/TINT 26ML (MISCELLANEOUS) ×2 IMPLANT
COVER BACK TABLE 60X90IN (DRAPES) ×2 IMPLANT
COVER MAYO STAND STRL (DRAPES) ×2 IMPLANT
DECANTER SPIKE VIAL GLASS SM (MISCELLANEOUS) IMPLANT
DERMABOND ADVANCED (GAUZE/BANDAGES/DRESSINGS) ×1
DERMABOND ADVANCED .7 DNX12 (GAUZE/BANDAGES/DRESSINGS) ×1 IMPLANT
DRAPE LAPAROTOMY 100X72 PEDS (DRAPES) ×2 IMPLANT
DRAPE UTILITY XL STRL (DRAPES) ×2 IMPLANT
ELECT REM PT RETURN 9FT ADLT (ELECTROSURGICAL) ×2
ELECTRODE REM PT RTRN 9FT ADLT (ELECTROSURGICAL) ×1 IMPLANT
GLOVE BIOGEL M 6.5 STRL (GLOVE) ×2 IMPLANT
GLOVE BIOGEL M STRL SZ7.5 (GLOVE) ×2 IMPLANT
GLOVE BIOGEL PI IND STRL 8 (GLOVE) ×1 IMPLANT
GLOVE BIOGEL PI INDICATOR 8 (GLOVE) ×1
GLOVE ECLIPSE 8.0 STRL XLNG CF (GLOVE) ×2 IMPLANT
GOWN STRL REUS W/ TWL LRG LVL3 (GOWN DISPOSABLE) ×2 IMPLANT
GOWN STRL REUS W/TWL LRG LVL3 (GOWN DISPOSABLE) ×2
NEEDLE HYPO 25X1 1.5 SAFETY (NEEDLE) ×2 IMPLANT
NS IRRIG 1000ML POUR BTL (IV SOLUTION) ×2 IMPLANT
PACK BASIN DAY SURGERY FS (CUSTOM PROCEDURE TRAY) ×2 IMPLANT
PENCIL BUTTON HOLSTER BLD 10FT (ELECTRODE) IMPLANT
SLEEVE SCD COMPRESS KNEE MED (MISCELLANEOUS) ×2 IMPLANT
SPONGE LAP 4X18 X RAY DECT (DISPOSABLE) ×2 IMPLANT
STRIP CLOSURE SKIN 1/2X4 (GAUZE/BANDAGES/DRESSINGS) IMPLANT
SUT MON AB 4-0 PC3 18 (SUTURE) ×2 IMPLANT
SUT VICRYL 3-0 CR8 SH (SUTURE) ×2 IMPLANT
SYR CONTROL 10ML LL (SYRINGE) ×2 IMPLANT
TOWEL OR 17X24 6PK STRL BLUE (TOWEL DISPOSABLE) ×2 IMPLANT
TOWEL OR NON WOVEN STRL DISP B (DISPOSABLE) ×2 IMPLANT

## 2016-07-19 NOTE — Op Note (Signed)

## 2016-07-19 NOTE — Interval H&P Note (Signed)
History and Physical Interval Note:  07/19/2016 11:07 AM  Isabella Cook  has presented today for surgery, with the diagnosis of History of breast cancer  The various methods of treatment have been discussed with the patient and family. After consideration of risks, benefits and other options for treatment, the patient has consented to  Procedure(s) with comments: REMOVAL PORT-A-CATH (N/A) - ANESTHESIA - MAC/LOCAL as a surgical intervention .  The patient's history has been reviewed, patient examined, no change in status, stable for surgery.  I have reviewed the patient's chart and labs.  Questions were answered to the patient's satisfaction.     Ramy Greth A.

## 2016-07-19 NOTE — H&P (Signed)
H&P Encounter Date:  Isabella Luna, MD  Surgery    '[]' Hide copied text Isabella Cook  Location: [facility] Patient #: [id number] DOB: March 06, 1940 Married / Language: Isabella Cook / Race: White Female  History of Present Illness Isabella Cook A. Isabella Cobern MD;  11:32 AM) Patient words: Patient returns for follow-up after breast conserving surgery in 2017 for breast cancer. She's completing chemotherapy. She would like to have her port removed. Overall, she is finished with chemotherapy and radiation therapy and has done well.               per oncology    76 y.o. 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 (a) echo 07/24//2017 shows an ejection fraction of  55-60%  (4) on 08/07/2015 she underwent (a) right lumpectomy and sentinel lymph node sampling showing ypT0 ypN0 (complete pathologic response) (b) two left lumpectomies, for ympT2 ypN1 invasive ductal carcinoma, grade 1, with positive margins.  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  The patient is a 76 year old female.   Allergies Isabella Cook, RMA;  BEEF Gelatin *Assorted Classes** any medication that is gelatin based  Medication History Isabella Cook, RMA; Herceptin (440MG For Solution, Intravenous) Active. Claritin (10MG Tablet, Oral) Active. Methylcellulose (Laxative) (475MG Tablet, Oral) Active. Estring (2MG Ring, Vaginal) Active. Tums 500 (1250MG Tablet Chewable, Oral) Active. Pepcid (20MG Tablet, Oral) Active. Probiotic Acidophilus (Oral) Active. Vitamin D (Cholecalciferol) (1000UNIT Capsule, Oral) Active. Magnesium Citrate (100MG Tablet, Oral) Active. Besivance (0.6% Suspension, Ophthalmic) Active. Lotemax (0.5% Gel, Ophthalmic) Active. Prolensa (0.07% Solution, Ophthalmic) Active. Tamoxifen Citrate (20MG Tablet, Oral) Active. Medications Reconciled    Vitals Isabella Cook RMA; 06/07/2016 10:45 AM Weight: 176.2 lb Height: 63.5in Body Surface Area: 1.84 m Body Mass Index: 30.72 kg/m  Temp.: 98.12F  Pulse: 93 (Regular)  BP: 130/84 (Sitting, Left Arm, Standard)      Physical Exam (Isabella Cook A. Isabella Rizzolo MD; General Mental  Status-Alert. General Appearance-Consistent with stated age. Hydration-Well hydrated. Voice-Normal.  Chest and Lung Exam Note: Port site right upper chest wall noted. No signs of infection  and intact.  Neurologic Neurologic evaluation reveals -alert and oriented x 3 with no impairment of recent or remote memory. Mental Status-Normal.  Musculoskeletal Normal Exam - Left-Upper Extremity Strength Normal and Lower Extremity Strength Normal. Normal Exam - Right-Upper Extremity Strength Normal and Lower Extremity Strength Normal.    Assessment & Plan (Isabella Cook A. Isabella Lich MD; HISTORY OF BREAST CANCER (Z85.3) Impression: doing well remove port   NAD  POST-OPERATIVE STATE [phone]) Impression: remove port  Current Plans I recommended surgery to remove the catheter. I explained the technique of removal with use of local anesthesia & possible need for more aggressive sedation/anesthesia for patient comfort.  Risks such as bleeding, infection, and other risks were discussed. Post-operative dressing/incision care was discussed. I noted a good likelihood this will help address the problem. We will work to minimize complications. Questions were answered. The patient expresses understanding & wishes to proceed with surgery.  Pt Education - [facility] Free Text Education/Instructions: discussed with patient and provided information.

## 2016-07-19 NOTE — Anesthesia Postprocedure Evaluation (Signed)
Anesthesia Post Note  Patient: Isabella Cook  Procedure(s) Performed: Procedure(s) (LRB): REMOVAL PORT-A-CATH (Right)     Patient location during evaluation: PACU Anesthesia Type: MAC Level of consciousness: awake and alert Pain management: pain level controlled Vital Signs Assessment: post-procedure vital signs reviewed and stable Respiratory status: spontaneous breathing, nonlabored ventilation and respiratory function stable Cardiovascular status: stable and blood pressure returned to baseline Anesthetic complications: no    Last Vitals:  Vitals:   07/19/16 1221 07/19/16 1234  BP:  (!) 149/74  Pulse: 85 85  Resp: 16 18  Temp: 36.7 C 36.7 C    Last Pain:  Vitals:   07/19/16 1008  TempSrc: Oral                 Lynda Rainwater

## 2016-07-19 NOTE — Transfer of Care (Signed)
Immediate Anesthesia Transfer of Care Note  Patient: Isabella Cook  Procedure(s) Performed: Procedure(s) with comments: REMOVAL PORT-A-CATH (Right) - ANESTHESIA - MAC/LOCAL  Patient Location: PACU  Anesthesia Type:MAC  Level of Consciousness: awake and patient cooperative  Airway & Oxygen Therapy: Patient Spontanous Breathing  Post-op Assessment: Report given to RN and Post -op Vital signs reviewed and stable  Post vital signs: Reviewed and stable  Last Vitals:  Vitals:   07/19/16 1008  BP: 135/73  Pulse: 81  Resp: 18  Temp: 36.7 C    Last Pain:  Vitals:   07/19/16 1008  TempSrc: Oral         Complications: No apparent anesthesia complications

## 2016-07-19 NOTE — Discharge Instructions (Signed)
GENERAL SURGERY: POST OP INSTRUCTIONS ° °###################################################################### ° °EAT °Gradually transition to a high fiber diet with a fiber supplement over the next few weeks after discharge.  Start with a pureed / full liquid diet (see below) ° °WALK °Walk an hour a day.  Control your pain to do that.   ° °CONTROL PAIN °Control pain so that you can walk, sleep, tolerate sneezing/coughing, go up/down stairs. ° °HAVE A BOWEL MOVEMENT DAILY °Keep your bowels regular to avoid problems.  OK to try a laxative to override constipation.  OK to use an antidairrheal to slow down diarrhea.  Call if not better after 2 tries ° °CALL IF YOU HAVE PROBLEMS/CONCERNS °Call if you are still struggling despite following these instructions. °Call if you have concerns not answered by these instructions ° °###################################################################### ° ° ° °1. DIET: Follow a light bland diet the first 24 hours after arrival home, such as soup, liquids, crackers, etc.  Be sure to include lots of fluids daily.  Avoid fast food or heavy meals as your are more likely to get nauseated.   °2. Take your usually prescribed home medications unless otherwise directed. °3. PAIN CONTROL: °a. Pain is best controlled by a usual combination of three different methods TOGETHER: °i. Ice/Heat °ii. Over the counter pain medication °iii. Prescription pain medication °b. Most patients will experience some swelling and bruising around the incisions.  Ice packs or heating pads (30-60 minutes up to 6 times a day) will help. Use ice for the first few days to help decrease swelling and bruising, then switch to heat to help relax tight/sore spots and speed recovery.  Some people prefer to use ice alone, heat alone, alternating between ice & heat.  Experiment to what works for you.  Swelling and bruising can take several weeks to resolve.   °c. It is helpful to take an over-the-counter pain medication  regularly for the first few weeks.  Choose one of the following that works best for you: °i. Naproxen (Aleve, etc)  Two 220mg tabs twice a day °ii. Ibuprofen (Advil, etc) Three 200mg tabs four times a day (every meal & bedtime) °iii. Acetaminophen (Tylenol, etc) 500-650mg four times a day (every meal & bedtime) °d. A  prescription for pain medication (such as oxycodone, hydrocodone, etc) should be given to you upon discharge.  Take your pain medication as prescribed.  °i. If you are having problems/concerns with the prescription medicine (does not control pain, nausea, vomiting, rash, itching, etc), please call us [phone] to see if we need to switch you to a different pain medicine that will work better for you and/or control your side effect better. °ii. If you need a refill on your pain medication, please contact your pharmacy.  They will contact our office to request authorization. Prescriptions will not be filled after 5 pm or on week-ends. °4. Avoid getting constipated.  Between the surgery and the pain medications, it is common to experience some constipation.  Increasing fluid intake and taking a fiber supplement (such as Metamucil, Citrucel, FiberCon, MiraLax, etc) 1-2 times a day regularly will usually help prevent this problem from occurring.  A mild laxative (prune juice, Milk of Magnesia, MiraLax, etc) should be taken according to package directions if there are no bowel movements after 48 hours.   °5. Wash / shower every day.  You may shower over the dressings as they are waterproof.  Continue to shower over incision(s) after the dressing is off. °6. Remove your waterproof bandages   5 days after surgery.  You may leave the incision open to air.  You may have skin tapes (Steri Strips) covering the incision(s).  Leave them on until one week, then remove.  You may replace a dressing/Band-Aid to cover the incision for comfort if you wish.      7. ACTIVITIES as tolerated:   a. You may resume  regular (light) daily activities beginning the next day--such as daily self-care, walking, climbing stairs--gradually increasing activities as tolerated.  If you can walk 30 minutes without difficulty, it is safe to try more intense activity such as jogging, treadmill, bicycling, low-impact aerobics, swimming, etc. b. Save the most intensive and strenuous activity for last such as sit-ups, heavy lifting, contact sports, etc  Refrain from any heavy lifting or straining until you are off narcotics for pain control.   c. DO NOT PUSH THROUGH PAIN.  Let pain be your guide: If it hurts to do something, don't do it.  Pain is your body warning you to avoid that activity for another week until the pain goes down. d. You may drive when you are no longer taking prescription pain medication, you can comfortably wear a seatbelt, and you can safely maneuver your car and apply brakes. e. Dennis Bast may have sexual intercourse when it is comfortable.  8. FOLLOW UP in our office a. Please call [facility] at [phone] to set up an appointment to see your surgeon in the office for a follow-up appointment approximately 2-3 weeks after your surgery. b. Make sure that you call for this appointment the day you arrive home to insure a convenient appointment time. 9. IF YOU HAVE DISABILITY OR FAMILY LEAVE FORMS, BRING THEM TO THE OFFICE FOR PROCESSING.  DO NOT GIVE THEM TO YOUR DOCTOR.   WHEN TO CALL us [phone]: 1. Poor pain control 2. Reactions / problems with new medications (rash/itching, nausea, etc)  3. Fever over 101.5 F (38.5 C) 4. Worsening swelling or bruising 5. Continued bleeding from incision. 6. Increased pain, redness, or drainage from the incision 7. Difficulty breathing / swallowing   The clinic staff is available to answer your questions during regular business hours (8:30am-5pm).  Please dont hesitate to call and ask to speak to one of our nurses for clinical concerns.   If you have a medical emergency,  go to the nearest emergency room or call 911.  A surgeon from [facility] is always on call at the [facility], [address], [place], [place], [place]  [id number] ? MAIN: [phone] ? TOLL FREE: [phone] ?  FAX [phone] [email or web address]    Post Anesthesia Home Care Instructions  Activity: Get plenty of rest for the remainder of the day. A responsible individual must stay with you for 24 hours following the procedure.  For the next 24 hours, DO NOT: -Drive a car -Paediatric nurse -Drink alcoholic beverages -Take any medication unless instructed by your physician -Make any legal decisions or sign important papers.  Meals: Start with liquid foods such as gelatin or soup. Progress to regular foods as tolerated. Avoid greasy, spicy, heavy foods. If nausea and/or vomiting occur, drink only clear liquids until the nausea and/or vomiting subsides. Call your physician if vomiting continues.  Special Instructions/Symptoms: Your throat may feel dry or sore from the anesthesia or the breathing tube placed in your throat during surgery. If this causes discomfort, gargle with warm salt water. The discomfort should disappear within 24 hours.  If you had a scopolamine patch placed behind your ear for the management of post- operative nausea and/or vomiting:  1. The medication in the patch is effective for 72 hours, after which it should be removed.  Wrap patch in a tissue and discard in the trash. Wash hands thoroughly with soap and water. 2. You may remove the patch earlier than 72 hours if you experience unpleasant side effects which may include dry mouth, dizziness or visual disturbances. 3. Avoid touching the patch. Wash your hands with soap and water after contact with the patch.   Call your surgeon if you experience:   1.  Fever over 101.0. 2.  Inability to urinate. 3.  Nausea and/or vomiting. 4.  Extreme  swelling or bruising at the surgical site. 5.  Continued bleeding from the incision. 6.  Increased pain, redness or drainage from the incision. 7.  Problems related to your pain medication. 8.  Any problems and/or concerns

## 2016-07-19 NOTE — Anesthesia Procedure Notes (Signed)
Procedure Name: MAC Date/Time: 07/19/2016 11:18 AM Performed by: Marrianne Mood Pre-anesthesia Checklist: Patient identified, Timeout performed, Emergency Drugs available, Suction available and Patient being monitored Patient Re-evaluated:Patient Re-evaluated prior to induction Oxygen Delivery Method: Simple face mask Preoxygenation: Pre-oxygenation with 100% oxygen

## 2016-07-19 NOTE — Anesthesia Preprocedure Evaluation (Signed)
Anesthesia Evaluation  Patient identified by MRN, date of birth, ID band Patient awake    Reviewed: Allergy & Precautions, NPO status , Patient's Chart, lab work & pertinent test results  Airway Mallampati: II  TM Distance: >3 FB Neck ROM: Full    Dental no notable dental hx.    Pulmonary neg pulmonary ROS,    Pulmonary exam normal breath sounds clear to auscultation       Cardiovascular negative cardio ROS Normal cardiovascular exam Rhythm:Regular Rate:Normal     Neuro/Psych negative neurological ROS  negative psych ROS   GI/Hepatic Neg liver ROS, GERD  Medicated and Controlled,  Endo/Other  Hypothyroidism   Renal/GU negative Renal ROS  negative genitourinary   Musculoskeletal negative musculoskeletal ROS (+) Arthritis , Osteoarthritis,    Abdominal   Peds negative pediatric ROS (+)  Hematology negative hematology ROS (+)   Anesthesia Other Findings   Reproductive/Obstetrics negative OB ROS                             Anesthesia Physical  Anesthesia Plan  ASA: II  Anesthesia Plan: MAC   Post-op Pain Management:    Induction: Intravenous  PONV Risk Score and Plan: 2 and Ondansetron and Propofol  Airway Management Planned: Simple Face Mask  Additional Equipment:   Intra-op Plan:   Post-operative Plan:   Informed Consent: I have reviewed the patients History and Physical, chart, labs and discussed the procedure including the risks, benefits and alternatives for the proposed anesthesia with the patient or authorized representative who has indicated his/her understanding and acceptance.   Dental advisory given  Plan Discussed with: CRNA  Anesthesia Plan Comments:         Anesthesia Quick Evaluation

## 2016-07-20 ENCOUNTER — Encounter (HOSPITAL_BASED_OUTPATIENT_CLINIC_OR_DEPARTMENT_OTHER): Payer: Self-pay | Admitting: Surgery

## 2016-08-10 ENCOUNTER — Ambulatory Visit (HOSPITAL_BASED_OUTPATIENT_CLINIC_OR_DEPARTMENT_OTHER): Payer: Medicare Other | Admitting: Adult Health

## 2016-08-10 ENCOUNTER — Encounter: Payer: Self-pay | Admitting: Adult Health

## 2016-08-10 VITALS — BP 128/66 | HR 81 | Temp 98.1°F | Resp 18 | Ht 63.5 in | Wt 177.0 lb

## 2016-08-10 DIAGNOSIS — Z170 Estrogen receptor positive status [ER+]: Secondary | ICD-10-CM

## 2016-08-10 DIAGNOSIS — C50412 Malignant neoplasm of upper-outer quadrant of left female breast: Secondary | ICD-10-CM

## 2016-08-10 DIAGNOSIS — C50411 Malignant neoplasm of upper-outer quadrant of right female breast: Secondary | ICD-10-CM

## 2016-08-10 DIAGNOSIS — C50511 Malignant neoplasm of lower-outer quadrant of right female breast: Secondary | ICD-10-CM

## 2016-08-10 NOTE — Progress Notes (Signed)
CLINIC:  Survivorship   REASON FOR VISIT:  Routine follow-up post-treatment for a recent history of breast cancer.  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             (b) right breast lower outer quadrant: a clinical T1b N0, stage IA invasive ductal carcinoma, estrogen and progesterone receptor positive, HER-2 not amplified, with an Mib-1 of 4%      04/29/2015 - 07/15/2015 Neo-Adjuvant Chemotherapy    neoadjuvant chemotherapy consisting of weekly paclitaxel 12 together with weekly trastuzumab started 04/29/2015             (a) 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      09/02/2015 Surgery    Re excision cleared margins      10/07/2015 - 11/17/2015 Radiation Therapy    adjuvant radiation 10/07/2015 to 11/17/2015 1. The Right breast was treated to 50 Gy in 25 fractions at 2 Gy per  fraction. 2. The Left breast (4-field) was treated to 50 Gy in 25 fractions at 2 Gy per fraction. 3. The Left breast was boosted to 10 Gy in 5 fractions at 2 Gy per fraction.       12/2015 -  Anti-estrogen oral therapy    Tamoxifen daily       INTERVAL HISTORY:  Ms. Isabella Cook presents to the [facility] today for our initial meeting to review her survivorship care plan detailing her treatment course for breast cancer, as well as monitoring long-term side effects of that treatment, education regarding health maintenance, screening, and overall wellness and health promotion.     Overall, Ms. Isabella Cook reports feeling quite well.  She is taking Tamoxifen daily and is tolerating it well.     REVIEW OF SYSTEMS:  Review of Systems  Constitutional: Negative for appetite change, chills, fatigue, fever and unexpected weight change.  HENT:   Negative for hearing loss and lump/mass.   Eyes: Negative for eye problems and icterus.  Respiratory: Negative for chest tightness, cough and shortness of breath.   Cardiovascular: Negative for chest pain, leg swelling and palpitations.  Gastrointestinal: Negative for abdominal distention and abdominal pain.  Endocrine: Negative for hot flashes.  Genitourinary: Negative for difficulty urinating.   Musculoskeletal: Negative for arthralgias.  Skin: Negative for itching and rash.  Neurological: Negative for dizziness, extremity weakness, headaches and numbness.  Hematological: Negative for adenopathy. Does not bruise/bleed easily.  Psychiatric/Behavioral: Negative for depression. The patient is not nervous/anxious.   Breast: Denies any new nodularity, masses, tenderness, nipple changes, or nipple discharge.      ONCOLOGY TREATMENT TEAM:  1. Surgeon:  Dr. [name] Stage at [facility] 2. Medical Oncologist: Dr. [name]  3. Radiation Oncologist: Dr. Isidore Moos    PAST MEDICAL/SURGICAL HISTORY:  Past Medical History:  Diagnosis Date  . Arthritis     wrists and knees  . Cancer ([place]) 03-2015   left breast  . GERD (gastroesophageal reflux disease)   . History of hiatal hernia   . History of jaundice as a child   . History of radiation therapy completed 11/17/15   50 Gy to Right Breast, 60 Gy to Left Breast with regional nodal treatment on Left as well.   . Hypothyroidism   . Multiple food allergies   . Urinary tract infection    Past Surgical History:  Procedure Laterality Date  . ABDOMINAL HYSTERECTOMY    . APPENDECTOMY    . BACK SURGERY     lumbar  . BREAST LUMPECTOMY Bilateral    Oct. 2017  . BREAST LUMPECTOMY WITH RADIOACTIVE SEED AND SENTINEL LYMPH NODE BIOPSY Bilateral 08/07/2015   Procedure: LEFT BREAST LUMPECTOMY LOCALIZED WITH 2 RADIOACTIVE SEEDS, RIGHT BREAST SEED GUIDED LUMPECTOMY, BILATERAL SENTINEL LYMPH NODE MAPPING;  Surgeon: [name], MD;  Location: [facility];  Service: General;  Laterality: Bilateral;  . BREAST RECONSTRUCTION Bilateral 09/02/2015   Procedure: BILATERAL ONCOPLASTIC RECONSTRUCTION WITH MASTOPEXY;  Surgeon: [name], MD;  Location: [place];  Service: Plastics;  Laterality: Bilateral;  . CATARACT EXTRACTION    . [place] [facility]  2016  . CHOLECYSTECTOMY    . HERNIA REPAIR     UHR  . MASTOPEXY Bilateral 09/02/2015   Procedure: MASTOPEXY;  Surgeon: [name], MD;  Location: [facility];  Service: Plastics;  Laterality: Bilateral;  . PORT-A-CATH REMOVAL Right 07/19/2016   Procedure: REMOVAL PORT-A-CATH;  Surgeon: [name], MD;  Location: [facility] [facility];  Service: General;  Laterality: Right;  ANESTHESIA - MAC/LOCAL  . PORTACATH PLACEMENT Right 04/22/2015   Procedure: INSERTION PORT-A-CATH WITH [place];  Surgeon: [name], MD;  Location: [facility];  Service: General;  Laterality: Right;  . RE-EXCISION OF BREAST LUMPECTOMY Left 09/02/2015   Procedure: RE-EXCISION OF LEFT BREAST LUMPECTOMY;  Surgeon: [name], MD;   Location: [facility];  Service: General;  Laterality: Left;  . TUBAL LIGATION       ALLERGIES:  Allergies  Allergen Reactions  . Dairy Aid [Lactase] Anaphylaxis     Takes claritin every day and benadryl, if needed, to prevent anaphylaxis   . Eggs Or Egg-Derived Products Anaphylaxis    Nasal stuffiness, Takes claritin every day and benadryl, if needed, to prevent anaphylaxis  . Gelatin (Bovine) [Beef Extract] Anaphylaxis    Muscle pain,  Takes claritin every day and benadryl, if needed, to prevent anaphylaxis   . Gluten Meal Anaphylaxis and Diarrhea    Takes claritin every day and benadryl, if needed, to prevent anaphylaxis   . Lambs Quarters Anaphylaxis    ALL "mammal" MEAT per pt -- Muscle pain  Can eat chicken, fish, [place]  . Milk-Related Compounds Anaphylaxis  Nasal stuffiness Cheese (mozzarella, swiss, cheddar, cottage)  . Pork Allergy Anaphylaxis    ALL "mammal" MEAT per pt -- Muscle pain  Can eat chicken, fish, [place]  . Wheat Bran Anaphylaxis and Diarrhea     Takes claritin every day and benadryl, if needed, to prevent anaphylaxis   . Whey Anaphylaxis and Diarrhea     Takes claritin every day and benadryl, if needed, to prevent anaphylaxis   . Yeast Anaphylaxis     Takes claritin every day and benadryl, if needed, to prevent anaphylaxis  . Soy Allergy Diarrhea  . Almond Oil Nausea And Vomiting     CURRENT MEDICATIONS:  Outpatient Encounter Prescriptions as of 08/10/2016  Medication Sig  . Biotin 5000 MCG CAPS Take 5,000 mcg by mouth.  . calcium carbonate (TUMS - DOSED IN MG ELEMENTAL CALCIUM) 500 MG chewable tablet Chew 500 mg by mouth daily as needed for indigestion or heartburn.   . diphenhydrAMINE (SOMINEX) 25 MG tablet Take 25 mg by mouth at bedtime as needed. Reported on 05/20/2015  . estradiol (ESTRING) 2 MG vaginal ring Place 2 mg vaginally every 3 (three) months. follow package directions  . famotidine (PEPCID) 20 MG tablet Take 20 mg by  mouth daily.   . Liothyronine Sodium POWD 5 mcg by Does not apply route.  . loratadine (CLARITIN) 10 MG tablet Take 10 mg by mouth daily.  . Magnesium Citrate 100 MG TABS Take 200 mg by mouth daily.  . Methylcellulose, Laxative, (CITRUCEL) 500 MG TABS Take 2,000 mg by mouth 2 (two) times daily.  . naproxen sodium (ANAPROX) 220 MG tablet Take 440 mg by mouth 3 (three) times daily with meals as needed.   Vladimir Faster Glycol-Propyl Glycol (SYSTANE ULTRA) 0.4-0.3 % SOLN Place 1 drop into both eyes daily as needed (dry eyes).  . Probiotic Product (ALIGN PO) Take 1 tablet by mouth daily.   . tamoxifen (NOLVADEX) 20 MG tablet Take 20 mg by mouth daily.  Marland Kitchen VITAMIN D-VITAMIN K PO Take 1 capsule by mouth daily. Reported on 04/15/2015  . [DISCONTINUED] Bromfenac Sodium (PROLENSA) 0.07 % SOLN Apply to eye.  . [DISCONTINUED] loteprednol (LOTEMAX) 0.5 % ophthalmic suspension 4 (four) times daily.   No facility-administered encounter medications on file as of 08/10/2016.      ONCOLOGIC FAMILY HISTORY:  No family history on file.   SOCIAL HISTORY:  Leanndra Pember is married and lives with her husband in [place], [place].   Ms. Trauger is currently retired.  She denies any current or history of tobacco, alcohol, or illicit drug use.     PHYSICAL EXAMINATION:  Vital Signs:   Vitals:   08/10/16 1003  BP: 128/66  Pulse: 81  Resp: 18  Temp: 98.1 F (36.7 C)   Filed Weights   08/10/16 1003  Weight: 177 lb (80.3 kg)   General: Well-nourished, well-appearing female in no acute distress.  She is accompanied in clinic by her husband today.   HEENT: Head is normocephalic.  Pupils equal and reactive to light. Conjunctivae clear without exudate.  Sclerae anicteric. Oral mucosa is pink, moist.  Oropharynx is pink without lesions or erythema.  Lymph: No cervical, supraclavicular, or infraclavicular lymphadenopathy noted on palpation.  Cardiovascular: Regular rate and rhythm.Marland Kitchen Respiratory: Clear to  auscultation bilaterally. Chest expansion symmetric; breathing non-labored.  GI: Abdomen soft and round; non-tender, non-distended. Bowel sounds normoactive.  GU: Deferred.  Neuro: No focal deficits. Steady gait.  Psych: Mood and affect normal and appropriate for situation.  Extremities:  No edema. MSK: No focal spinal tenderness to palpation.  Full range of motion in bilateral upper extremities Skin: Warm and dry.  LABORATORY DATA:  None for this visit.  DIAGNOSTIC IMAGING:  None for this visit.      ASSESSMENT AND PLAN:  Ms.. Cook is a pleasant 76 y.o. 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  She presents to the Survivorship Clinic for our initial meeting and routine follow-up post-completion of treatment for breast cancer.    1. Bilateral breast cancer:  Ms. Lesmeister is continuing to recover from definitive treatment for breast cancer. She will follow-up with her medical oncologist, Dr. [name] in 09/2016 with history and physical exam per surveillance protocol.  She will continue her anti-estrogen therapy with Tamoxifen. Thus far, she is tolerating the Tamoxifen well, with minimal side effects. Today, a comprehensive survivorship care plan and treatment summary was reviewed with the patient today detailing her breast cancer diagnosis, treatment course, potential late/long-term effects of treatment, appropriate follow-up care with recommendations for the future, and patient education resources.  A copy of this summary, along with a letter will be sent to the patient's primary care provider via mail/fax/In Basket message after today's visit.    2. Bone health:  Given Ms. Asher's age/history of  breast cancer, she is at risk for bone demineralization.  I counseled her that Tamoxifen will have a protective effect on her bones.  She was given education on specific activities to promote bone health.  I will defer to her PCP regarding bone density testing and management.    3. Cancer screening:  Due to Ms. Zayas's history and her age, she should receive screening for skin cancers, colon cancer, and gynecologic cancers.  The information and recommendations are listed on the patient's comprehensive care plan/treatment summary and were reviewed in detail with the patient.    4. Health maintenance and wellness promotion: Ms. Rainford was encouraged to consume 5-7 servings of fruits and vegetables per day. We reviewed the "Nutrition Rainbow" handout, as well as the handout "Take Control of Your Health and Reduce Your Cancer Risk" from the [facility].  She was also encouraged to engage in moderate to vigorous exercise for 30 minutes per day most days of the week. We discussed the [employer] fitness program, which is designed for cancer survivors to help them become more physically fit after cancer treatments.  She was instructed to limit her alcohol consumption and continue to abstain from tobacco use.     5. Support services/counseling: It is not uncommon for this period of the patient's cancer care trajectory to be one of many emotions and stressors.  We discussed an opportunity for her to participate in the next session of [facility] ("Finding Your New Normal") support group series designed for patients after they have completed treatment.   Ms. Milham was encouraged to take advantage of our many other support services programs, support groups, and/or counseling in coping with her new life as a cancer survivor after completing anti-cancer treatment.  She was offered support today through active listening and expressive supportive counseling.  She was given information regarding our available services  and encouraged to contact me with any questions or for help enrolling in any of our support group/programs.    Dispo:   -  Return to cancer center 09/2016 for follow up with Dr. [name] -Mammogram due in 07/2017 -Follow up with Dr. [name] Stage 12/2016 -She is welcome to return back to the Survivorship Clinic at any time; no additional follow-up needed at this time.  -Consider referral back to survivorship as a long-term survivor for continued surveillance  A total of (30) minutes of face-to-face time was spent with this patient with greater than 50% of that time in counseling and care-coordination.   Gardenia Phlegm, NP Survivorship [facility] [phone]   Note: PRIMARY CARE PROVIDER [name], [place] [phone]

## 2016-09-14 ENCOUNTER — Ambulatory Visit (HOSPITAL_BASED_OUTPATIENT_CLINIC_OR_DEPARTMENT_OTHER): Payer: Medicare Other | Admitting: Oncology

## 2016-09-14 ENCOUNTER — Encounter: Payer: Self-pay | Admitting: Oncology

## 2016-09-14 ENCOUNTER — Other Ambulatory Visit (HOSPITAL_BASED_OUTPATIENT_CLINIC_OR_DEPARTMENT_OTHER): Payer: Medicare Other

## 2016-09-14 ENCOUNTER — Telehealth: Payer: Self-pay | Admitting: Oncology

## 2016-09-14 VITALS — BP 129/61 | HR 82 | Temp 98.1°F | Resp 17 | Ht 63.5 in | Wt 177.4 lb

## 2016-09-14 DIAGNOSIS — C50412 Malignant neoplasm of upper-outer quadrant of left female breast: Secondary | ICD-10-CM

## 2016-09-14 DIAGNOSIS — S90112A Contusion of left great toe without damage to nail, initial encounter: Secondary | ICD-10-CM | POA: Diagnosis not present

## 2016-09-14 DIAGNOSIS — R911 Solitary pulmonary nodule: Secondary | ICD-10-CM | POA: Diagnosis not present

## 2016-09-14 DIAGNOSIS — C50411 Malignant neoplasm of upper-outer quadrant of right female breast: Secondary | ICD-10-CM | POA: Diagnosis not present

## 2016-09-14 DIAGNOSIS — Z170 Estrogen receptor positive status [ER+]: Secondary | ICD-10-CM | POA: Diagnosis not present

## 2016-09-14 DIAGNOSIS — C50511 Malignant neoplasm of lower-outer quadrant of right female breast: Secondary | ICD-10-CM | POA: Diagnosis not present

## 2016-09-14 DIAGNOSIS — M8580 Other specified disorders of bone density and structure, unspecified site: Secondary | ICD-10-CM | POA: Diagnosis not present

## 2016-09-14 DIAGNOSIS — N898 Other specified noninflammatory disorders of vagina: Secondary | ICD-10-CM | POA: Diagnosis not present

## 2016-09-14 LAB — COMPREHENSIVE METABOLIC PANEL
ALT: 15 U/L (ref 0–55)
AST: 15 U/L (ref 5–34)
Albumin: 3.5 g/dL (ref 3.5–5.0)
Alkaline Phosphatase: 36 U/L — ABNORMAL LOW (ref 40–150)
Anion Gap: 7 mEq/L (ref 3–11)
BUN: 11.7 mg/dL (ref 7.0–26.0)
CO2: 26 meq/L (ref 22–29)
CREATININE: 1 mg/dL (ref 0.6–1.1)
Calcium: 9.6 mg/dL (ref 8.4–10.4)
Chloride: 108 mEq/L (ref 98–109)
EGFR: 54 mL/min/{1.73_m2} — ABNORMAL LOW (ref >90)
GLUCOSE: 113 mg/dL (ref 70–140)
Potassium: 4.2 mEq/L (ref 3.5–5.1)
SODIUM: 141 meq/L (ref 136–145)
Total Bilirubin: <0.22 mg/dL (ref 0.20–1.20)
Total Protein: 6.5 g/dL (ref 6.4–8.3)

## 2016-09-14 LAB — CBC WITH DIFFERENTIAL/PLATELET
BASO%: 0.8 % (ref 0.0–2.0)
BASOS ABS: 0 10*3/uL (ref 0.0–0.1)
EOS%: 3.7 % (ref 0.0–7.0)
Eosinophils Absolute: 0.2 10*3/uL (ref 0.0–0.5)
HCT: 36.5 % (ref 34.8–46.6)
HEMOGLOBIN: 12.4 g/dL (ref 11.6–15.9)
LYMPH%: 18.9 % (ref 14.0–49.7)
MCH: 31.3 pg (ref 25.1–34.0)
MCHC: 34 g/dL (ref 31.5–36.0)
MCV: 91.9 fL (ref 79.5–101.0)
MONO#: 0.6 10*3/uL (ref 0.1–0.9)
MONO%: 14.4 % — AB (ref 0.0–14.0)
NEUT%: 62.2 % (ref 38.4–76.8)
NEUTROS ABS: 2.6 10*3/uL (ref 1.5–6.5)
Platelets: 119 10*3/uL — ABNORMAL LOW (ref 145–400)
RBC: 3.97 10*6/uL (ref 3.70–5.45)
RDW: 13.8 % (ref 11.2–14.5)
WBC: 4.2 10*3/uL (ref 3.9–10.3)
lymph#: 0.8 10*3/uL — ABNORMAL LOW (ref 0.9–3.3)

## 2016-09-14 NOTE — Telephone Encounter (Signed)
Gave patient avs and calendar with appts.  °

## 2016-09-14 NOTE — Progress Notes (Signed)
[place]  [phone] [phone]   ID: Isabella Cook DOB: 27-Sep-1940  MR#: [id number]  YIA#:[id number]  Patient Care Team: [name], FNP as PCP - General (Family Medicine) Alexxa Sabet, Virgie Dad, MD as Consulting Physician (Oncology) [name], MD as Consulting Physician (General Surgery) [name], MD as Consulting Physician (Neurosurgery) [name], MD as Referring Physician (Otolaryngology) Bensimhon, Shaune Pascal, MD as Consulting Physician (Cardiology) Delice Bison, Charlestine Massed, NP as Nurse Practitioner (Hematology and Oncology) Armbruster, Carlota Raspberry, MD as Consulting Physician (Gastroenterology) PCP: [name], FNP OTHER MD:  CHIEF COMPLAINT: HER-2 positive breast cancer, bilateral beast cancers  CURRENT TREATMENT: Tamoxifen   BREAST CANCER HISTORY:   From the original intake note:  Isabella Cook had routine screening mammography at [facility] suggesting an area of indeterminate microcalcifications in the upper outer quadrant of the left breast measuring 8 mm. She was referred for left breast stereotactic biopsy performed at the [place] 02/27/2015. This showed ([id number]) invasive ductal carcinoma, grade 2, estrogen receptor 100% positive, progesterone receptor 100% positive, both with strong staining intensity, with an MIB-1 of 20%, and HER-2 amplified with a signals ratio of 2.53, the number per cell being 2.65. 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  On 03/25/2015 the patient underwent bilateral breast MRI, which showed the breast density to be category B. this showed, in the left breast, an area of non-masslike enhancement measuring 7.8 cm. Associated with this were 2 adjacent irregular masses measuring 1.7 and 1.4 cm. The masses were 5 cm anterior and superior to the biopsy clip. There was a cortically thickened left axillary lymph node noted.  Also in the right breast there was a 7 mm irregular  enhancing mass at the 6:30 o'clock position.  Her subsequent history is as detailed below    INTERVAL HISTORY: [name] returns today for follow-up and treatment of her estrogen receptor positive breast cancer. She continues on tamoxifen, and has been tolerating it well. She denies hot flashes. She notes that she has an increase in vaginal discharge and she wears a daily feminine pad.  She states that she recently had her port removed and bilateral cataract surgery. Pt reports that was recently on top of her house fixing her roof, which she has had to complete every 3-5 years. She hopes that with the new barrier that she added to her roof, she will not have to fix her roof for another 15 years. Pt received her allergy injection and has decreased use of benadryl and Claritin. She notes that she was informed to take B12, Vitamin A, and a probiotic. Pt was informed that she had a candida infection localized to her mouth by a provider. She notes that she is due for a colonoscopy and would like to have recommendations.    REVIEW OF SYSTEMS: Isabella Cook reports that she has been "okay". Pt reports RLE resolved swelling and superficial varicose vein to her right calf. She reports mildly resolved right ankle swelling. She denies unusual headaches, visual changes, nausea, vomiting, or dizziness. There has been no unusual cough, phlegm production, or pleurisy. This been no change in bowel or bladder habits. She denies unexplained fatigue or unexplained weight loss, bleeding, rash, or fever. She does have a spot in her left big toenail she wanted me to look at. A detailed review of systems today was otherwise stable.  PAST MEDICAL HISTORY: Past Medical History:  Diagnosis Date  . Arthritis    wrists and knees  . Cancer ([place]) 03-2015  left breast  . GERD (gastroesophageal reflux disease)   . History of hiatal hernia   . History of jaundice as a child   . History of radiation therapy completed 11/17/15   50 Gy to  Right Breast, 60 Gy to Left Breast with regional nodal treatment on Left as well.   . Hypothyroidism   . Multiple food allergies   . Urinary tract infection     PAST SURGICAL HISTORY: Past Surgical History:  Procedure Laterality Date  . ABDOMINAL HYSTERECTOMY    . APPENDECTOMY    . BACK SURGERY     lumbar  . BREAST LUMPECTOMY Bilateral    Oct. 2017  . BREAST LUMPECTOMY WITH RADIOACTIVE SEED AND SENTINEL LYMPH NODE BIOPSY Bilateral 08/07/2015   Procedure: LEFT BREAST LUMPECTOMY LOCALIZED WITH 2 RADIOACTIVE SEEDS, RIGHT BREAST SEED GUIDED LUMPECTOMY, BILATERAL SENTINEL LYMPH NODE MAPPING;  Surgeon: [name], MD;  Location: [place];  Service: General;  Laterality: Bilateral;  . BREAST RECONSTRUCTION Bilateral 09/02/2015   Procedure: BILATERAL ONCOPLASTIC RECONSTRUCTION WITH MASTOPEXY;  Surgeon: [name], MD;  Location: [place];  Service: Plastics;  Laterality: Bilateral;  . CATARACT EXTRACTION    . [place] SURGERY  2016  . CHOLECYSTECTOMY    . HERNIA REPAIR     UHR  . MASTOPEXY Bilateral 09/02/2015   Procedure: MASTOPEXY;  Surgeon: [name], MD;  Location: [place];  Service: Plastics;  Laterality: Bilateral;  . PORT-A-CATH REMOVAL Right 07/19/2016   Procedure: REMOVAL PORT-A-CATH;  Surgeon: [name], MD;  Location: [place];  Service: General;  Laterality: Right;  ANESTHESIA - MAC/LOCAL  . PORTACATH PLACEMENT Right 04/22/2015   Procedure: INSERTION PORT-A-CATH WITH [place];  Surgeon: [name], MD;  Location: [place];  Service: General;  Laterality: Right;  . RE-EXCISION OF BREAST LUMPECTOMY Left 09/02/2015   Procedure: RE-EXCISION OF LEFT BREAST LUMPECTOMY;  Surgeon: [name], MD;  Location: [place];  Service: General;  Laterality: Left;  . TUBAL LIGATION      FAMILY HISTORY No family history on file. The patient's father had a history of pseudo-bulbar pulse 20  and died from a stroke at age 5. The patient's mother died at age 46 from complications of emphysema. The patient had no brothers, 2 sisters. There is no history of cancer in the family and specifically no history of breast or ovarian cancer.  GYNECOLOGIC HISTORY:  No LMP recorded. Patient has had a hysterectomy. Menarche age 89, first live birth age 9. She is GX P2. She underwent a hysterectomy with left salpingo-oophorectomy in 1984. She received hormone replacement for approximately 15 years, until 2005.  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 Her husband [name] is a retired Social research officer, government. Daughter [name] lives in [place] where she is a housewife, homeschooling her children. Son Daissy Yerian lives in [place] and works in Chartered certified accountant. The patient has 2 grandchildren. She is not a church attender    ADVANCED DIRECTIVES: In place   HEALTH MAINTENANCE: Social History  Substance Use Topics  . Smoking status: Never Smoker  . Smokeless tobacco: Never Used  . Alcohol use Yes     Comment: occ 1 every 2-3 months     Colonoscopy:2014  PAP:  Bone density: 2017 [facility]//osteopenia   Lipid panel:  Allergies  Allergen Reactions  . Dairy Aid [Lactase] Anaphylaxis     Takes  claritin every day and benadryl, if needed, to prevent anaphylaxis   . Eggs Or Egg-Derived Products Anaphylaxis    Nasal stuffiness, Takes claritin every day and benadryl, if needed, to prevent anaphylaxis  . Gelatin (Bovine) [Beef Extract] Anaphylaxis    Muscle pain,  Takes claritin every day and benadryl, if needed, to prevent anaphylaxis   . Gluten Meal Anaphylaxis and Diarrhea    Takes claritin every day and benadryl, if needed, to prevent anaphylaxis   . Lambs Quarters Anaphylaxis    ALL "mammal" MEAT per pt -- Muscle pain  Can eat chicken, fish, [place]  . Milk-Related Compounds Anaphylaxis    Nasal  stuffiness Cheese (mozzarella, swiss, cheddar, cottage)  . Pork Allergy Anaphylaxis    ALL "mammal" MEAT per pt -- Muscle pain  Can eat chicken, fish, [place]  . Wheat Bran Anaphylaxis and Diarrhea     Takes claritin every day and benadryl, if needed, to prevent anaphylaxis   . Whey Anaphylaxis and Diarrhea     Takes claritin every day and benadryl, if needed, to prevent anaphylaxis   . Yeast Anaphylaxis     Takes claritin every day and benadryl, if needed, to prevent anaphylaxis  . Soy Allergy Diarrhea  . Almond Oil Nausea And Vomiting    Current Outpatient Prescriptions  Medication Sig Dispense Refill  . Biotin 5000 MCG CAPS Take 5,000 mcg by mouth.    . calcium carbonate (TUMS - DOSED IN MG ELEMENTAL CALCIUM) 500 MG chewable tablet Chew 500 mg by mouth daily as needed for indigestion or heartburn.     . diphenhydrAMINE (SOMINEX) 25 MG tablet Take 25 mg by mouth at bedtime as needed. Reported on 05/20/2015    . estradiol (ESTRING) 2 MG vaginal ring Place 2 mg vaginally every 3 (three) months. follow package directions    . famotidine (PEPCID) 20 MG tablet Take 20 mg by mouth daily.     . Liothyronine Sodium POWD 5 mcg by Does not apply route.    . loratadine (CLARITIN) 10 MG tablet Take 10 mg by mouth daily.    . Magnesium Citrate 100 MG TABS Take 200 mg by mouth daily.    . Methylcellulose, Laxative, (CITRUCEL) 500 MG TABS Take 2,000 mg by mouth 2 (two) times daily.    . naproxen sodium (ANAPROX) 220 MG tablet Take 440 mg by mouth 3 (three) times daily with meals as needed.     Vladimir Faster Glycol-Propyl Glycol (SYSTANE ULTRA) 0.4-0.3 % SOLN Place 1 drop into both eyes daily as needed (dry eyes).    . Probiotic Product (ALIGN PO) Take 1 tablet by mouth daily.     . tamoxifen (NOLVADEX) 20 MG tablet Take 20 mg by mouth daily.    Marland Kitchen VITAMIN D-VITAMIN K PO Take 1 capsule by mouth daily. Reported on 04/15/2015     No current facility-administered medications for this visit.      OBJECTIVE: Middle-aged white woman In no acute distress  Vitals:   09/14/16 1351  BP: 129/61  Pulse: 82  Resp: 17  Temp: 98.1 F (36.7 C)  SpO2: 98%     Body mass index is 30.93 kg/m.    ECOG FS:0 - Asymptomatic   Sclerae unicteric, pupils round and equal Oropharynx clear and moist No cervical or supraclavicular adenopathy Lungs no rales or rhonchi Heart regular rate and rhythm Abd soft, nontender, positive bowel sounds MSK no focal spinal tenderness, no upper extremity lymphedema Neuro: nonfocal, well oriented, appropriate affect Breasts: Status post  bilateral mammoplasty, status post bilateral lumpectomy, status post bilateral radiation. There is no evidence of disease recurrence. Both axillae are benign. Skin: The left big toenail lesion is imaged below.   Photo left foot/left big toenail 09/14/2016    LAB RESULTS:  CMP     Component Value Date/Time   NA 143 05/18/2016 1304   K 4.2 05/18/2016 1304   CO2 26 05/18/2016 1304   GLUCOSE 123 05/18/2016 1304   BUN 13.6 05/18/2016 1304   CREATININE 1.0 05/18/2016 1304   CALCIUM 9.7 05/18/2016 1304   PROT 6.8 05/18/2016 1304   ALBUMIN 3.7 05/18/2016 1304   AST 18 05/18/2016 1304   ALT 17 05/18/2016 1304   ALKPHOS 42 05/18/2016 1304   BILITOT 0.22 05/18/2016 1304    INo results found for: SPEP, UPEP  Lab Results  Component Value Date   WBC 4.2 09/14/2016   NEUTROABS 2.6 09/14/2016   HGB 12.4 09/14/2016   HCT 36.5 09/14/2016   MCV 91.9 09/14/2016   PLT 119 (L) 09/14/2016      Chemistry      Component Value Date/Time   NA 143 05/18/2016 1304   K 4.2 05/18/2016 1304   CO2 26 05/18/2016 1304   BUN 13.6 05/18/2016 1304   CREATININE 1.0 05/18/2016 1304      Component Value Date/Time   CALCIUM 9.7 05/18/2016 1304   ALKPHOS 42 05/18/2016 1304   AST 18 05/18/2016 1304   ALT 17 05/18/2016 1304   BILITOT 0.22 05/18/2016 1304       No results found for: LABCA2  No components found for:  LABCA125  No results for input(s): INR in the last 168 hours.  Urinalysis    Component Value Date/Time   LABSPEC 1.005 12/12/2015 1410   PHURINE 6.0 12/12/2015 1410   GLUCOSEU Negative 12/12/2015 1410   HGBUR Negative 12/12/2015 1410   BILIRUBINUR Negative 12/12/2015 1410   KETONESUR Negative 12/12/2015 1410   PROTEINUR Negative 12/12/2015 1410   UROBILINOGEN 0.2 12/12/2015 1410   NITRITE Negative 12/12/2015 1410   LEUKOCYTESUR Small 12/12/2015 1410     ELIGIBLE FOR AVAILABLE RESEARCH PROTOCOL: not a PALLAS trial candidate as HER-2 positive   STUDIES: Bilateral mammography with tomography at the [facility] 07/13/2016 found a breast density to be category C. There was no evidence of malignancy.  ASSESSMENT: 76 y.o. [facility] woman  status post left breast upper outer quadrant biopsy 02/27/2015 for a clinical mT1-3 N1 invasive ductal carcinoma, grade 2, strongly estrogen and progesterone receptor positive, HER-2 amplified, with an MIB-1 of 20%.   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  (b) right breast lower outer quadrant: a clinical T1b N0, stage IA invasive ductal carcinoma, estrogen and progesterone receptor positive, HER-2 not amplified, with an Mib-1 of 4%  (2)  neoadjuvant chemotherapy consisting of weekly paclitaxel 12 together with weekly trastuzumab started 04/29/2015  (a) 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  (4) on 08/07/2015 she underwent  (a) right lumpectomy and sentinel lymph node sampling showing ypT0 ypN0 (complete pathologic response)  (b) two left lumpectomies, for ympT2 ypN1 invasive ductal carcinoma, grade 1, with positive margins.  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 The Right breast was treated to 50 Gy in 25 fractions at 2 Gy per fraction. 2. The Left breast (4-field) was treated to 50 Gy in 25 fractions at 2 Gy per fraction. 3. The Left breast was boosted to 10 Gy in 5 fractions at 2 Gy per fraction.   (7). Tamoxifen started 12/23/2015 --  (a) discontinued Estring as of 05/05/2015, resumed January 2018 under cover of tamoxifen  (8) incidentally noted left lower lobe 0.7 cm nodule on 07/10/2015 MRI , requiring eventual follow-up  (a) repeat CT scan of the chest 12/12/2015 showed the nodule unchanged (0.6 cm)  (b) repeat CT scan mid 2019 suggested per radiology   PLAN: I spent approximately 30 minutes with Bethena Roys with most of that time spent discussing her multiple problems. Gionni is a year out from definitive surgery for her breast cancer with no evidence of disease recurrence. This is favorable.  She is tolerating tamoxifen well. The plan will be to continue that for a minimum of 5 years.  She gets colonoscopies every 5 years because of being high risk. She would prefer not to do it to her prior colonoscopies. We are referring her to the [facility] to accomplish that.  She brought a lesion in her left big toe to my attention. She does not recall any trauma to this area. I think this is worth dermatologic evaluation and I have entered that for Dr. Denna Haggard.  Otherwise she will return to see  me in February. She knows to call for any problems that may develop before the next visit.  Saajan Willmon, Virgie Dad, MD  09/14/16 2:03 PM Medical Oncology and Hematology [facility] [address] [place], [place] [address] Tel. [phone]    Fax. [phone]  This document serves as a record of services personally performed by Lurline Del, MD. It was created on her behalf by Steva Colder, a trained medical scribe. The creation of this record is based on the scribe's personal observations and the provider's statements to them. This document has been checked and approved by the attending provider.

## 2016-09-15 ENCOUNTER — Encounter: Payer: Self-pay | Admitting: Gastroenterology

## 2016-09-22 ENCOUNTER — Telehealth: Payer: Self-pay

## 2016-09-22 NOTE — Telephone Encounter (Signed)
Patient does have an appointment to see you in office on 11/09/16. She is not interested in scheduling direct colonoscopy, nor is she interested in coming in any sooner to office. She would like to meet you first, she said she will bring her old GI records with her. I offered to schedule her procedure while I had her on the phone, but she wants to wait to meet you first.

## 2016-09-23 NOTE — Telephone Encounter (Signed)
Okay that's fine thanks 

## 2016-10-13 ENCOUNTER — Telehealth: Payer: Self-pay | Admitting: *Deleted

## 2016-10-13 NOTE — Telephone Encounter (Signed)
Pt left VM stating she has not heard from the dermatologist per referral by Dr Jannifer Rodney at visit on 09/14/2016 as well as she cannot remember name of MD who she was referred to.  Per MD dictation referral was to Dr Denna Haggard-  This RN attempted to call his office at [phone] and per automated VM informed the office is closed until 130pm  This RN returned call to pt and obtained her identified VM- message left per above with Dr Onalee Hua phone number for contact.  This RN left her name and return call number as well if needed.

## 2016-11-09 ENCOUNTER — Ambulatory Visit (INDEPENDENT_AMBULATORY_CARE_PROVIDER_SITE_OTHER): Payer: Medicare Other | Admitting: Gastroenterology

## 2016-11-09 ENCOUNTER — Encounter: Payer: Self-pay | Admitting: Gastroenterology

## 2016-11-09 VITALS — BP 140/80 | HR 76 | Ht 63.0 in | Wt 173.4 lb

## 2016-11-09 DIAGNOSIS — Z86010 Personal history of colonic polyps: Secondary | ICD-10-CM | POA: Diagnosis not present

## 2016-11-09 DIAGNOSIS — K219 Gastro-esophageal reflux disease without esophagitis: Secondary | ICD-10-CM

## 2016-11-09 NOTE — Patient Instructions (Addendum)
If you are age 75 or older, your body mass index should be between 23-30. Your Body mass index is 30.72 kg/m. If this is out of the aforementioned range listed, please consider follow up with your Primary Care Provider.  If you are age 81 or younger, your body mass index should be between 19-25. Your Body mass index is 30.72 kg/m. If this is out of the aformentioned range listed, please consider follow up with your Primary Care Provider.   You will be due for a recall colonoscopy in February 2019. We will send you a reminder in the mail when it gets closer to that time.   Thank you.

## 2016-11-09 NOTE — Progress Notes (Signed)
HPI :  76 y/o female with a history of breast cancer, GERD, arthritis here for a new patient visit to discuss colon cancer screening / history of colon polyps.   She reports baseline mild constipation. She's been using magnesium citrate as needed and states this works well for her is not too harsh. She denies any blood in her stools. She denies any abdominal pains. She denies any nausea or vomiting, that she is eating well. She reports she was put on align probiotic several years ago for history of IBS and has been taking it every day since then, and asks if she needs to continue this. Her last colonoscopy was in 2014 at which point she had 2 small adenomas. He denies any family history of colon cancer.  She reports history of reflux which is mild. She is using Pepcid as needed which works well for her. She reports multiple food allergies in the past. She refers to rhinorrhea and coughing after eating particular foods, states she has had allergy testing done with specific food allergens identified. She reports she has tested negative for celiac disease. She has also had a negative prior breath test for bacterial overgrowth.   She states in general she is otherwise in good health denies any complaints today. Breast cancer is in remission  Colonoscopy 02/12/2012 - diverticulosis - left colon, hemorrhoids, 2 descending colon TAs, prep was "excellent" - done in [facility], Dr. Abner Greenspan Colonoscopy 01/31/2007 - normal exam other than diverticulosis sigmoid colon Colonoscopy 05/2002 - diverticulosis, hemorrhoids Colonoscopy 12/99 - 2 small hyperplastic polyps, diverticulosis   Past Medical History:  Diagnosis Date  . Arthritis    wrists and knees  . Cancer ([place]) 03-2015   left breast  . GERD (gastroesophageal reflux disease)   . History of hiatal hernia   . History of jaundice as a child   . History of radiation therapy completed 11/17/15   50 Gy to Right Breast, 60 Gy to Left Breast with  regional nodal treatment on Left as well.   . Hypothyroidism   . Multiple food allergies   . Urinary tract infection      Past Surgical History:  Procedure Laterality Date  . ABDOMINAL HYSTERECTOMY    . APPENDECTOMY    . BACK SURGERY     lumbar  . BREAST LUMPECTOMY Bilateral    Oct. 2017  . CATARACT EXTRACTION    . [place] SURGERY  2016  . CHOLECYSTECTOMY    . HERNIA REPAIR     UHR  . TUBAL LIGATION     Family History  Problem Relation Age of Onset  . Diabetes Mother   . Diabetes Sister    Social History   Tobacco Use  . Smoking status: Never Smoker  . Smokeless tobacco: Never Used  Substance Use Topics  . Alcohol use: Yes    Comment: occ 1 every 2-3 months  . Drug use: No   Current Outpatient Medications  Medication Sig Dispense Refill  . Biotin 5000 MCG CAPS Take 5,000 mcg by mouth.    . calcium carbonate (TUMS - DOSED IN MG ELEMENTAL CALCIUM) 500 MG chewable tablet Chew 500 mg by mouth daily as needed for indigestion or heartburn.     . Cyanocobalamin (VITAMIN B-12 PO) Take 5 mg by mouth daily.    . diphenhydrAMINE (SOMINEX) 25 MG tablet Take 25 mg by mouth at bedtime as needed. Reported on 05/20/2015    . estradiol (ESTRING) 2 MG vaginal ring  Place 2 mg vaginally every 3 (three) months. follow package directions    . famotidine (PEPCID) 20 MG tablet Take 20 mg by mouth daily.     . Liothyronine Sodium POWD 5 mcg by Does not apply route.    . loratadine (CLARITIN) 10 MG tablet Take 10 mg by mouth daily.    . Magnesium Citrate 100 MG TABS Take 400 mg by mouth daily.     . Methylcellulose, Laxative, (CITRUCEL) 500 MG TABS Take 2,000 mg by mouth 2 (two) times daily.    . naproxen sodium (ANAPROX) 220 MG tablet Take 440 mg by mouth 3 (three) times daily with meals as needed.     Vladimir Faster Glycol-Propyl Glycol (SYSTANE ULTRA) 0.4-0.3 % SOLN Place 1 drop into both eyes daily as needed (dry eyes).    . Probiotic Product (ALIGN PO) Take 1 tablet by mouth daily.      Marland Kitchen SACCHAROMYCES BOULARDII PO Take by mouth daily.    . tamoxifen (NOLVADEX) 20 MG tablet Take 20 mg by mouth daily.    Marland Kitchen VITAMIN A PO Take 5,000 Units by mouth daily.    Marland Kitchen VITAMIN D-VITAMIN K PO Take 1 capsule by mouth daily. Reported on 04/15/2015     No current facility-administered medications for this visit.    Allergies  Allergen Reactions  . Dairy Aid [Lactase] Anaphylaxis     Takes claritin every day and benadryl, if needed, to prevent anaphylaxis   . Eggs Or Egg-Derived Products Anaphylaxis    Nasal stuffiness, Takes claritin every day and benadryl, if needed, to prevent anaphylaxis  . Gelatin (Bovine) [Beef Extract] Anaphylaxis    Muscle pain,  Takes claritin every day and benadryl, if needed, to prevent anaphylaxis   . Gluten Meal Anaphylaxis and Diarrhea    Takes claritin every day and benadryl, if needed, to prevent anaphylaxis   . Lambs Quarters Anaphylaxis    ALL "mammal" MEAT per pt -- Muscle pain  Can eat chicken, fish, [place]  . Milk-Related Compounds Anaphylaxis    Nasal stuffiness Cheese (mozzarella, swiss, cheddar, cottage)  . Pork Allergy Anaphylaxis    ALL "mammal" MEAT per pt -- Muscle pain  Can eat chicken, fish, [place]  . Wheat Bran Anaphylaxis and Diarrhea     Takes claritin every day and benadryl, if needed, to prevent anaphylaxis   . Whey Anaphylaxis and Diarrhea     Takes claritin every day and benadryl, if needed, to prevent anaphylaxis   . Yeast Anaphylaxis     Takes claritin every day and benadryl, if needed, to prevent anaphylaxis  . Soy Allergy Diarrhea  . Almond Oil Nausea And Vomiting     Review of Systems: All systems reviewed and negative except where noted in HPI.   Lab Results  Component Value Date   WBC 4.2 09/14/2016   HGB 12.4 09/14/2016   HCT 36.5 09/14/2016   MCV 91.9 09/14/2016   PLT 119 (L) 09/14/2016    Lab Results  Component Value Date   CREATININE 1.0 09/14/2016   BUN 11.7 09/14/2016   NA 141 09/14/2016   K  4.2 09/14/2016   CO2 26 09/14/2016    Lab Results  Component Value Date   ALT 15 09/14/2016   AST 15 09/14/2016   ALKPHOS 36 (L) 09/14/2016   BILITOT <0.22 09/14/2016     Physical Exam: BP 140/80   Pulse 76   Ht 5\' 3"  (1.6 m)   Wt 173 lb 6.4 oz (78.7 kg)  BMI 30.72 kg/m  Constitutional: Pleasant,well-developed, female in no acute distress. HEENT: Normocephalic and atraumatic. Conjunctivae are normal. No scleral icterus. Neck supple.  Cardiovascular: Normal rate, regular rhythm.  Pulmonary/chest: Effort normal and breath sounds normal. No wheezing, rales or rhonchi. Abdominal: Soft, nondistended, nontender.  There are no masses palpable. No hepatomegaly. Extremities: no edema Lymphadenopathy: No cervical adenopathy noted. Neurological: Alert and oriented to person place and time. Skin: Skin is warm and dry. No rashes noted. Psychiatric: Normal mood and affect. Behavior is normal.   ASSESSMENT AND PLAN: 76 year old female with a history of breast cancer that is in remission, here to discuss having a surveillance colonoscopy for history of colon adenomas.   She will be due for surveillance colonoscopy in February 2019. We discussed if she wanted to have any further colonoscopy exams given her age. She is otherwise in good health and feels well and wants to proceed with colonoscopy following discussion of risks and benefits of the exam. She will call back in another month or so to schedule for February.  Otherwise we discussed indications for probiotics and uses for them. She's been on it chronically for years and is not sure if it's providing any benefit, recommend she stop at this time and see how she feels. She is otherwise continuing her diet of avoiding foods she thinks she is allergic to, although not sure if these are just intolerances. She has tested negative for celiac disease per her report.  For her reflux she can continue Pepcid as needed, working well for her, no  alarm symptoms.  She agreed with the plan, all questions answered.   [place] Cellar, MD [facility] Pager [phone]  CC: Magrinat, Virgie Dad, MD

## 2017-01-18 ENCOUNTER — Encounter: Payer: Self-pay | Admitting: Gastroenterology

## 2017-02-15 ENCOUNTER — Inpatient Hospital Stay: Payer: Medicare Other

## 2017-02-15 ENCOUNTER — Telehealth: Payer: Self-pay | Admitting: Oncology

## 2017-02-15 ENCOUNTER — Inpatient Hospital Stay: Payer: Medicare Other | Attending: Oncology | Admitting: Oncology

## 2017-02-15 VITALS — BP 122/66 | HR 77 | Temp 98.5°F | Resp 18 | Ht 63.0 in | Wt 175.3 lb

## 2017-02-15 DIAGNOSIS — C50412 Malignant neoplasm of upper-outer quadrant of left female breast: Secondary | ICD-10-CM | POA: Diagnosis present

## 2017-02-15 DIAGNOSIS — Z170 Estrogen receptor positive status [ER+]: Secondary | ICD-10-CM | POA: Diagnosis not present

## 2017-02-15 DIAGNOSIS — C50511 Malignant neoplasm of lower-outer quadrant of right female breast: Secondary | ICD-10-CM

## 2017-02-15 DIAGNOSIS — M8580 Other specified disorders of bone density and structure, unspecified site: Secondary | ICD-10-CM

## 2017-02-15 DIAGNOSIS — R911 Solitary pulmonary nodule: Secondary | ICD-10-CM

## 2017-02-15 DIAGNOSIS — T451X5A Adverse effect of antineoplastic and immunosuppressive drugs, initial encounter: Secondary | ICD-10-CM

## 2017-02-15 DIAGNOSIS — N951 Menopausal and female climacteric states: Secondary | ICD-10-CM | POA: Diagnosis not present

## 2017-02-15 DIAGNOSIS — N898 Other specified noninflammatory disorders of vagina: Secondary | ICD-10-CM | POA: Diagnosis not present

## 2017-02-15 DIAGNOSIS — G620 Drug-induced polyneuropathy: Secondary | ICD-10-CM

## 2017-02-15 LAB — CBC WITH DIFFERENTIAL/PLATELET
Basophils Absolute: 0.1 10*3/uL (ref 0.0–0.1)
Basophils Relative: 1 %
EOS ABS: 0.2 10*3/uL (ref 0.0–0.5)
EOS PCT: 4 %
HCT: 39.2 % (ref 34.8–46.6)
HEMOGLOBIN: 13.1 g/dL (ref 11.6–15.9)
LYMPHS ABS: 1.1 10*3/uL (ref 0.9–3.3)
LYMPHS PCT: 24 %
MCH: 30.6 pg (ref 25.1–34.0)
MCHC: 33.4 g/dL (ref 31.5–36.0)
MCV: 91.7 fL (ref 79.5–101.0)
MONOS PCT: 14 %
Monocytes Absolute: 0.7 10*3/uL (ref 0.1–0.9)
NEUTROS PCT: 57 %
Neutro Abs: 2.6 10*3/uL (ref 1.5–6.5)
Platelets: 149 10*3/uL (ref 145–400)
RBC: 4.27 MIL/uL (ref 3.70–5.45)
RDW: 14.1 % (ref 11.2–14.5)
WBC: 4.6 10*3/uL (ref 3.9–10.3)

## 2017-02-15 LAB — COMPREHENSIVE METABOLIC PANEL
ALK PHOS: 36 U/L — AB (ref 40–150)
ALT: 10 U/L (ref 0–55)
ANION GAP: 8 (ref 3–11)
AST: 15 U/L (ref 5–34)
Albumin: 3.8 g/dL (ref 3.5–5.0)
BILIRUBIN TOTAL: 0.3 mg/dL (ref 0.2–1.2)
BUN: 11 mg/dL (ref 7–26)
CALCIUM: 9.6 mg/dL (ref 8.4–10.4)
CO2: 27 mmol/L (ref 22–29)
CREATININE: 1.01 mg/dL (ref 0.60–1.10)
Chloride: 107 mmol/L (ref 98–109)
GFR, EST NON AFRICAN AMERICAN: 53 mL/min — AB (ref >60)
Glucose, Bld: 113 mg/dL (ref 70–140)
Potassium: 4.1 mmol/L (ref 3.5–5.1)
SODIUM: 142 mmol/L (ref 136–145)
TOTAL PROTEIN: 7 g/dL (ref 6.4–8.3)

## 2017-02-15 NOTE — Telephone Encounter (Signed)
Gave avs and calendar for august  °

## 2017-02-15 NOTE — Progress Notes (Signed)
[facility]  [phone] [phone]   ID: Isabella Cook DOB: [birth year]  MR#: [id number]  CSN#:[id number]  Patient Care Team: Isabella Singh, FNP as PCP - General (Family Medicine) Isabella Cook, Isabella Dad, MD as Consulting Physician (Oncology) Isabella Luna, MD as Consulting Physician (General Surgery) Isabella Levine, MD as Consulting Physician (Neurosurgery) Isabella Booty, MD as Referring Physician (Otolaryngology) Isabella Cook, Isabella Pascal, MD as Consulting Physician (Cardiology) Isabella Cook, Isabella Massed, NP as Nurse Practitioner (Hematology and Oncology) Isabella Cook, Isabella Raspberry, MD as Consulting Physician (Gastroenterology) PCP: Isabella Singh, FNP OTHER MD:  CHIEF COMPLAINT: HER-2 positive breast cancer, bilateral beast cancers  CURRENT TREATMENT: Tamoxifen   BREAST CANCER HISTORY:   From the original intake note:  Isabella Cook routine screening mammography at [facility] suggesting an area of indeterminate microcalcifications in the upper outer quadrant of the left breast measuring 8 mm. She was referred for left breast stereotactic biopsy performed at the [facility] 02/27/2015. This showed ([id number]) invasive ductal carcinoma, grade 2, estrogen receptor 100% positive, progesterone receptor 100% positive, both with strong staining intensity, with an MIB-1 of 20%, and HER-2 amplified with a signals ratio of 2.53, the number per cell being 2.65. 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  On 03/25/2015 the patient underwent bilateral breast MRI, which showed the breast density to be category B. this showed, in the left breast, an area of non-masslike enhancement measuring 7.8 cm. Associated with this were 2 adjacent irregular masses measuring 1.7 and 1.4 cm. The masses were 5 cm anterior and superior to the biopsy clip. There was a cortically thickened left axillary lymph node noted.  Also in the right breast there was a 7 mm irregular  enhancing mass at the 6:30 o'clock position.  Her subsequent history is as detailed below    INTERVAL HISTORY: Isabella Cook returns today for follow-up and treatment of her estrogen receptor positive breast cancer accompanied by her husband. She continues on tamoxifen, with good tolerance. She notes that her hair is thinning. She notes that she has some hot flashes and increase in vaginal discharge.   REVIEW OF SYSTEMS: Isabella Cook reports that she is doing a lot of work and updates around her house. She notes that she walks her dog for 15 minutes twice per day. She notes that she discontinued taking vitamin A due to a beef and pork allergy. She notes that her allergies have gotten worse. She notes that she takes quercetin to aid this. She notes that her fingernails are looking much better.   PAST MEDICAL HISTORY: Past Medical History:  Diagnosis Date  . Arthritis    wrists and knees  . Cancer ([place]) 03-2015   left breast  . GERD (gastroesophageal reflux disease)   . History of hiatal hernia   . History of jaundice as a child   . History of radiation therapy completed 11/17/15   50 Gy to Right Breast, 60 Gy to Left Breast with regional nodal treatment on Left as well.   . Hypothyroidism   . Multiple food allergies   . Urinary tract infection     PAST SURGICAL HISTORY: Past Surgical History:  Procedure Laterality Date  . ABDOMINAL HYSTERECTOMY    . APPENDECTOMY    . BACK SURGERY     lumbar  . BREAST LUMPECTOMY Bilateral    Oct. 2017  . BREAST LUMPECTOMY WITH RADIOACTIVE SEED AND SENTINEL LYMPH NODE BIOPSY Bilateral 08/07/2015   Procedure: LEFT BREAST LUMPECTOMY LOCALIZED WITH 2 RADIOACTIVE SEEDS, RIGHT BREAST SEED  GUIDED LUMPECTOMY, BILATERAL SENTINEL LYMPH NODE MAPPING;  Surgeon: Isabella Luna, MD;  Location: [facility];  Service: General;  Laterality: Bilateral;  . BREAST RECONSTRUCTION Bilateral 09/02/2015   Procedure: BILATERAL ONCOPLASTIC RECONSTRUCTION WITH MASTOPEXY;   Surgeon: Isabella Limbo, MD;  Location: [facility];  Service: Plastics;  Laterality: Bilateral;  . CATARACT EXTRACTION    . [place] SURGERY  2016  . CHOLECYSTECTOMY    . HERNIA REPAIR     UHR  . MASTOPEXY Bilateral 09/02/2015   Procedure: MASTOPEXY;  Surgeon: Isabella Limbo, MD;  Location: [facility];  Service: Plastics;  Laterality: Bilateral;  . PORT-A-CATH REMOVAL Right 07/19/2016   Procedure: REMOVAL PORT-A-CATH;  Surgeon: Isabella Luna, MD;  Location: [facility];  Service: General;  Laterality: Right;  ANESTHESIA - MAC/LOCAL  . PORTACATH PLACEMENT Right 04/22/2015   Procedure: INSERTION PORT-A-CATH WITH [place];  Surgeon: Isabella Luna, MD;  Location: [facility];  Service: General;  Laterality: Right;  . RE-EXCISION OF BREAST LUMPECTOMY Left 09/02/2015   Procedure: RE-EXCISION OF LEFT BREAST LUMPECTOMY;  Surgeon: Isabella Luna, MD;  Location: [facility];  Service: General;  Laterality: Left;  . TUBAL LIGATION      FAMILY HISTORY Family History  Problem Relation Age of Onset  . Diabetes Mother   . Diabetes Sister    The patient's father Cook a history of pseudo-bulbar pulse 69 and died from a stroke at age 22. The patient's mother died at age 33 from complications of emphysema. The patient Cook no brothers, 2 sisters. There is no history of cancer in the family and specifically no history of breast or ovarian cancer.  GYNECOLOGIC HISTORY:  No LMP recorded. Patient has Cook a hysterectomy. Menarche age 89, first live birth age 19. She is GX P2. She underwent a hysterectomy with left salpingo-oophorectomy in 1984. She received hormone replacement for approximately 15 years, until 2005.  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 Her husband Isabella Cook is a retired Social research officer, government. Isabella Cook lives in [place] where she is a housewife,  homeschooling her children. Son Isabella Cook lives in [place] and works in Chartered certified accountant. The patient has 2 grandchildren. She is not a church attender    ADVANCED DIRECTIVES: In place   HEALTH MAINTENANCE: Social History   Tobacco Use  . Smoking status: Never Smoker  . Smokeless tobacco: Never Used  Substance Use Topics  . Alcohol use: Yes    Comment: occ 1 every 2-3 months  . Drug use: No     Colonoscopy:2014  PAP:  Bone density: 2017 [facility]//osteopenia   Lipid panel:  Allergies  Allergen Reactions  . Dairy Aid [Lactase] Anaphylaxis     Takes claritin every day and benadryl, if needed, to prevent anaphylaxis   . Eggs Or Egg-Derived Products Anaphylaxis    Nasal stuffiness, Takes claritin every day and benadryl, if needed, to prevent anaphylaxis  . Gelatin (Bovine) [Beef Extract] Anaphylaxis    Muscle pain,  Takes claritin every day and benadryl, if needed, to prevent anaphylaxis   . Gluten Meal Anaphylaxis and Diarrhea    Takes claritin every day and benadryl, if needed, to prevent anaphylaxis   . Lambs Quarters Anaphylaxis    ALL "mammal" MEAT per pt -- Muscle pain  Can eat chicken, fish, [place]  . Milk-Related Compounds Anaphylaxis    Nasal stuffiness Cheese (mozzarella, swiss, cheddar, cottage)  .  Pork Allergy Anaphylaxis    ALL "mammal" MEAT per pt -- Muscle pain  Can eat chicken, fish, [place]  . Wheat Bran Anaphylaxis and Diarrhea     Takes claritin every day and benadryl, if needed, to prevent anaphylaxis   . Whey Anaphylaxis and Diarrhea     Takes claritin every day and benadryl, if needed, to prevent anaphylaxis   . Yeast Anaphylaxis     Takes claritin every day and benadryl, if needed, to prevent anaphylaxis  . Soy Allergy Diarrhea  . Almond Oil Nausea And Vomiting    Current Outpatient Medications  Medication Sig Dispense Refill  . Biotin 5000 MCG CAPS Take 5,000 mcg by mouth.    . calcium carbonate (TUMS - DOSED IN MG ELEMENTAL  CALCIUM) 500 MG chewable tablet Chew 500 mg by mouth daily as needed for indigestion or heartburn.     . Cyanocobalamin (VITAMIN B-12 PO) Take 5 mg by mouth daily.    . diphenhydrAMINE (SOMINEX) 25 MG tablet Take 25 mg by mouth at bedtime as needed. Reported on 05/20/2015    . estradiol (ESTRING) 2 MG vaginal ring Place 2 mg vaginally every 3 (three) months. follow package directions    . famotidine (PEPCID) 20 MG tablet Take 20 mg by mouth daily.     . Liothyronine Sodium POWD 5 mcg by Does not apply route.    . loratadine (CLARITIN) 10 MG tablet Take 10 mg by mouth daily.    . Magnesium Citrate 100 MG TABS Take 400 mg by mouth daily.     . Methylcellulose, Laxative, (CITRUCEL) 500 MG TABS Take 2,000 mg by mouth 2 (two) times daily.    . naproxen sodium (ANAPROX) 220 MG tablet Take 440 mg by mouth 3 (three) times daily with meals as needed.     Vladimir Faster Glycol-Propyl Glycol (SYSTANE ULTRA) 0.4-0.3 % SOLN Place 1 drop into both eyes daily as needed (dry eyes).    . Probiotic Product (ALIGN PO) Take 1 tablet by mouth daily.     Marland Kitchen SACCHAROMYCES BOULARDII PO Take by mouth daily.    . tamoxifen (NOLVADEX) 20 MG tablet Take 20 mg by mouth daily.    Marland Kitchen VITAMIN A PO Take 5,000 Units by mouth daily.    Marland Kitchen VITAMIN D-VITAMIN K PO Take 1 capsule by mouth daily. Reported on 04/15/2015     No current facility-administered medications for this visit.     OBJECTIVE: Middle-aged white woman who appears well  Vitals:   02/15/17 1420  BP: 122/66  Pulse: 77  Resp: 18  Temp: 98.5 F (36.9 C)  SpO2: 96%     Body mass index is 31.05 kg/m.    ECOG FS:1 - Symptomatic but completely ambulatory   Sclerae unicteric, EOMs intact Oropharynx clear and moist No cervical or supraclavicular adenopathy Lungs no rales or rhonchi; however towards the end of today's visit she Cook a significant coughing spell which she attributes to allergies Heart regular rate and rhythm Abd soft, nontender, positive bowel  sounds MSK no focal spinal tenderness, no upper extremity lymphedema Neuro: nonfocal, well oriented, appropriate affect Breasts: She is status post bilateral lumpectomies with bilateral mammoplasty as well as bilateral radiation.  There is no evidence of local recurrence.  Both axillae are benign.   LAB RESULTS:  CMP     Component Value Date/Time   NA 142 02/15/2017 1326   NA 141 09/14/2016 1340   K 4.1 02/15/2017 1326   K 4.2 09/14/2016 1340  CL 107 02/15/2017 1326   CO2 27 02/15/2017 1326   CO2 26 09/14/2016 1340   GLUCOSE 113 02/15/2017 1326   GLUCOSE 113 09/14/2016 1340   BUN 11 02/15/2017 1326   BUN 11.7 09/14/2016 1340   CREATININE 1.01 02/15/2017 1326   CREATININE 1.0 09/14/2016 1340   CALCIUM 9.6 02/15/2017 1326   CALCIUM 9.6 09/14/2016 1340   PROT 7.0 02/15/2017 1326   PROT 6.5 09/14/2016 1340   ALBUMIN 3.8 02/15/2017 1326   ALBUMIN 3.5 09/14/2016 1340   AST 15 02/15/2017 1326   AST 15 09/14/2016 1340   ALT 10 02/15/2017 1326   ALT 15 09/14/2016 1340   ALKPHOS 36 (L) 02/15/2017 1326   ALKPHOS 36 (L) 09/14/2016 1340   BILITOT 0.3 02/15/2017 1326   BILITOT <0.22 09/14/2016 1340   GFRNONAA 53 (L) 02/15/2017 1326   GFRAA >60 02/15/2017 1326    INo results found for: SPEP, UPEP  Lab Results  Component Value Date   WBC 4.6 02/15/2017   NEUTROABS 2.6 02/15/2017   HGB 13.1 02/15/2017   HCT 39.2 02/15/2017   MCV 91.7 02/15/2017   PLT 149 02/15/2017      Chemistry      Component Value Date/Time   NA 142 02/15/2017 1326   NA 141 09/14/2016 1340   K 4.1 02/15/2017 1326   K 4.2 09/14/2016 1340   CL 107 02/15/2017 1326   CO2 27 02/15/2017 1326   CO2 26 09/14/2016 1340   BUN 11 02/15/2017 1326   BUN 11.7 09/14/2016 1340   CREATININE 1.01 02/15/2017 1326   CREATININE 1.0 09/14/2016 1340      Component Value Date/Time   CALCIUM 9.6 02/15/2017 1326   CALCIUM 9.6 09/14/2016 1340   ALKPHOS 36 (L) 02/15/2017 1326   ALKPHOS 36 (L) 09/14/2016 1340   AST 15  02/15/2017 1326   AST 15 09/14/2016 1340   ALT 10 02/15/2017 1326   ALT 15 09/14/2016 1340   BILITOT 0.3 02/15/2017 1326   BILITOT <0.22 09/14/2016 1340       No results found for: LABCA2  No components found for: LABCA125  No results for input(s): INR in the last 168 hours.  Urinalysis    Component Value Date/Time   LABSPEC 1.005 12/12/2015 1410   PHURINE 6.0 12/12/2015 1410   GLUCOSEU Negative 12/12/2015 1410   HGBUR Negative 12/12/2015 1410   BILIRUBINUR Negative 12/12/2015 1410   KETONESUR Negative 12/12/2015 1410   PROTEINUR Negative 12/12/2015 1410   UROBILINOGEN 0.2 12/12/2015 1410   NITRITE Negative 12/12/2015 1410   LEUKOCYTESUR Small 12/12/2015 1410     ELIGIBLE FOR AVAILABLE RESEARCH PROTOCOL: not a P[facility]AS trial candidate as HER-2 positive   STUDIES: Bilateral mammography with tomography at the [facility] 07/13/2016 found a breast density to be category C. There was no evidence of malignancy.  ASSESSMENT: 77 y.o. [facility] woman  status post left breast upper outer quadrant biopsy 02/27/2015 for a clinical mT1-3 N1 invasive ductal carcinoma, grade 2, strongly estrogen and progesterone receptor positive, HER-2 amplified, with an MIB-1 of 20%.   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  (b) right  breast lower outer quadrant: a clinical T1b N0, stage IA invasive ductal carcinoma, estrogen and progesterone receptor positive, HER-2 not amplified, with an Mib-1 of 4%  (2)  neoadjuvant chemotherapy consisting of weekly paclitaxel 12 together with weekly trastuzumab started 04/29/2015  (a)  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  (4) on 08/07/2015 she underwent  (a) right lumpectomy and sentinel lymph node sampling showing ypT0 ypN0 (complete pathologic response)  (b) two left lumpectomies, for ympT2 ypN1 invasive ductal carcinoma, grade 1, with positive margins.  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 The Right breast was treated to 50 Gy in 25 fractions at 2 Gy per fraction. 2. The Left breast (4-field) was treated to 50 Gy in 25 fractions at 2 Gy per fraction. 3. The Left breast was boosted to 10 Gy in 5 fractions at 2 Gy per fraction.   (7). Tamoxifen started 12/23/2015 --  (a) discontinued Estring as of 05/05/2015, resumed January 2018 under cover of tamoxifen  (8) incidentally noted left lower lobe 0.7 cm nodule on 07/10/2015 MRI , requiring eventual follow-up  (a) repeat CT scan of the chest 12/12/2015 showed the nodule unchanged (0.6 cm)  (b) repeat CT scan mid 2019 suggested per radiology   PLAN: Armina is now a year and a half out from definitive surgery for her breast cancer with no evidence of disease recurrence.  This is favorable.  She is tolerating tamoxifen well and she appreciates the fact that it allows her to use the Estring preparation.  The plan is to continue tamoxifen for a total of 5 years.  She will see me again in August.  She will have her mammography and also a final chest CT scan prior to that visit.  If all goes well I will start seeing her on a yearly basis thereafter  And she knows  to call for any other issues that may develop before then.  Alizae Bechtel, Isabella Dad, MD  02/15/17 2:46 PM Medical Oncology and Hematology [facility] [address] [place], [place] [address] Tel. [phone]    Fax. [phone]  This document serves as a record of services personally performed by Lurline Del, MD. It was created on his behalf by Sheron Nightingale, a trained medical scribe. The creation of this record is based on the scribe's personal observations and the provider's statements to them.   I have reviewed the above documentation for accuracy and completeness, and I agree with the above.

## 2017-02-16 ENCOUNTER — Telehealth: Payer: Self-pay | Admitting: *Deleted

## 2017-02-16 ENCOUNTER — Other Ambulatory Visit: Payer: Self-pay

## 2017-02-16 ENCOUNTER — Ambulatory Visit (AMBULATORY_SURGERY_CENTER): Payer: Self-pay | Admitting: *Deleted

## 2017-02-16 VITALS — Ht 63.0 in | Wt 178.0 lb

## 2017-02-16 DIAGNOSIS — Z86010 Personal history of colonic polyps: Secondary | ICD-10-CM

## 2017-02-16 MED ORDER — NA SULFATE-K SULFATE-MG SULF 17.5-3.13-1.6 GM/177ML PO SOLN: ORAL | 0 refills | Status: DC

## 2017-02-16 NOTE — Telephone Encounter (Signed)
John, patient has many allergies including eggs,soy,etc... Last colon 2014 in [facility] she received versed and demerol. [place] for [employer]? Please advise. Thank you,Robbin pv

## 2017-02-16 NOTE — Progress Notes (Signed)
Patient denies any allergies to eggs or soy. Patient denies any problems with anesthesia/sedation. Patient denies any oxygen use at home. Patient denies taking any diet/weight loss medications or blood thinners. EMMI education declined by pt. Patient aware of Suprep cost. Phone note sent to John Nulty,CRNA to review allergies before colon at [facility].

## 2017-02-16 NOTE — Telephone Encounter (Signed)
Robbin,  This pt received propofol on three occasions: 04/22/15, 08/07/15, and 09/02/15 without reaction or complication.  She is cleared for anesthetic care at [facility].  Thanks,  Osvaldo Angst

## 2017-02-16 NOTE — Telephone Encounter (Signed)
Noted. Thanks.

## 2017-03-02 ENCOUNTER — Ambulatory Visit (AMBULATORY_SURGERY_CENTER): Payer: Medicare Other | Admitting: Gastroenterology

## 2017-03-02 ENCOUNTER — Encounter: Payer: Self-pay | Admitting: Gastroenterology

## 2017-03-02 VITALS — BP 152/64 | HR 68 | Temp 97.5°F | Resp 12 | Wt 178.0 lb

## 2017-03-02 DIAGNOSIS — Z86010 Personal history of colonic polyps: Secondary | ICD-10-CM

## 2017-03-02 DIAGNOSIS — D124 Benign neoplasm of descending colon: Secondary | ICD-10-CM

## 2017-03-02 DIAGNOSIS — D123 Benign neoplasm of transverse colon: Secondary | ICD-10-CM

## 2017-03-02 DIAGNOSIS — D120 Benign neoplasm of cecum: Secondary | ICD-10-CM

## 2017-03-02 MED ORDER — SODIUM CHLORIDE 0.9 % IV SOLN: 500.0000 mL | Freq: Once | INTRAVENOUS | Status: DC

## 2017-03-02 NOTE — Progress Notes (Signed)
Called to room to assist during endoscopic procedure.  Patient ID and intended procedure confirmed with present staff. Received instructions for my participation in the procedure from the performing physician.  

## 2017-03-02 NOTE — Op Note (Signed)
[place] Patient Name: Isabella Cook Procedure Date: 03/02/2017 11:28 AM MRN: [id number] Endoscopist: Remo Lipps P. Diamante Truszkowski MD, MD Age: 77 Referring MD:  Date of Birth: [birth year] Gender: Female Account #: [id number] Procedure:                Colonoscopy Indications:              Surveillance: Personal history of adenomatous                            polyps on last colonoscopy 5 years ago Medicines:                Monitored Anesthesia Care Procedure:                Pre-Anesthesia Assessment:                           - Prior to the procedure, a History and Physical                            was performed, and patient medications and                            allergies were reviewed. The patient's tolerance of                            previous anesthesia was also reviewed. The risks                            and benefits of the procedure and the sedation                            options and risks were discussed with the patient.                            All questions were answered, and informed consent                            was obtained. Prior Anticoagulants: The patient has                            taken no previous anticoagulant or antiplatelet                            agents. ASA Grade Assessment: II - A patient with                            mild systemic disease. After reviewing the risks                            and benefits, the patient was deemed in                            satisfactory condition to undergo the procedure.  After obtaining informed consent, the colonoscope                            was passed under direct vision. Throughout the                            procedure, the patient's blood pressure, pulse, and                            oxygen saturations were monitored continuously. The                            Colonoscope was introduced through the anus and                            advanced to the the  cecum, identified by                            appendiceal orifice and ileocecal valve. The                            colonoscopy was performed without difficulty. The                            patient tolerated the procedure well. The quality                            of the bowel preparation was adequate. The                            ileocecal valve, appendiceal orifice, and rectum                            were photographed. Scope In: 11:41:10 AM Scope Out: 12:01:43 PM Scope Withdrawal Time: 0 hours 17 minutes 18 seconds  Total Procedure Duration: 0 hours 20 minutes 33 seconds  Findings:                 The perianal and digital rectal examinations were                            normal.                           A diminutive polyp was found in the cecum. The                            polyp was sessile. The polyp was removed with a                            cold biopsy forceps. Resection and retrieval were                            complete.  A 4 mm polyp was found in the transverse colon. The                            polyp was sessile. The polyp was removed with a                            cold snare. Resection and retrieval were complete.                           Three sessile polyps were found in the descending                            colon. The polyps were 3 to 4 mm in size. These                            polyps were removed with a cold snare. Resection                            and retrieval were complete.                           Multiple medium-mouthed diverticula were found in                            the sigmoid colon.                           The exam was otherwise without abnormality on                            direct and retroflexion views. Complications:            No immediate complications. Estimated blood loss:                            Minimal. Estimated Blood Loss:     Estimated blood loss was minimal. Impression:                - One diminutive polyp in the cecum, removed with a                            cold biopsy forceps. Resected and retrieved.                           - One 4 mm polyp in the transverse colon, removed                            with a cold snare. Resected and retrieved.                           - Three 3 to 4 mm polyps in the descending colon,                            removed with a cold  snare. Resected and retrieved.                           - Diverticulosis in the sigmoid colon.                           - The examination was otherwise normal on direct                            and retroflexion views. Recommendation:           - Patient has a contact number available for                            emergencies. The signs and symptoms of potential                            delayed complications were discussed with the                            patient. Return to normal activities tomorrow.                            Written discharge instructions were provided to the                            patient.                           - Resume previous diet.                           - Continue present medications.                           - Await pathology results.                           - Repeat colonoscopy is recommended for                            surveillance. The colonoscopy date will be                            determined after pathology results from today's                            exam become available for review. Remo Lipps P. Simcha Farrington MD, MD 03/02/2017 12:05:38 PM This report has been signed electronically.

## 2017-03-02 NOTE — Patient Instructions (Signed)
YOU HAD AN ENDOSCOPIC PROCEDURE TODAY AT THE [facility]:   Refer to the procedure report that was given to you for any specific questions about what was found during the examination.  If the procedure report does not answer your questions, please call your gastroenterologist to clarify.  If you requested that your care partner not be given the details of your procedure findings, then the procedure report has been included in a sealed envelope for you to review at your convenience later.  YOU SHOULD EXPECT: Some feelings of bloating in the abdomen. Passage of more gas than usual.  Walking can help get rid of the air that was put into your GI tract during the procedure and reduce the bloating. If you had a lower endoscopy (such as a colonoscopy or flexible sigmoidoscopy) you may notice spotting of blood in your stool or on the toilet paper. If you underwent a bowel prep for your procedure, you may not have a normal bowel movement for a few days.  Please Note:  You might notice some irritation and congestion in your nose or some drainage.  This is from the oxygen used during your procedure.  There is no need for concern and it should clear up in a day or so.  SYMPTOMS TO REPORT IMMEDIATELY:   Following lower endoscopy (colonoscopy or flexible sigmoidoscopy):  Excessive amounts of blood in the stool  Significant tenderness or worsening of abdominal pains  Swelling of the abdomen that is new, acute  Fever of 100F or higher    For urgent or emergent issues, a gastroenterologist can be reached at any hour by calling [phone].   DIET:  We do recommend a small meal at first, but then you may proceed to your regular diet.  Drink plenty of fluids but you should avoid alcoholic beverages for 24 hours.  ACTIVITY:  You should plan to take it easy for the rest of today and you should NOT DRIVE or use heavy machinery until tomorrow (because of the sedation medicines used during the test).     FOLLOW UP: Our staff will call the number listed on your records the next business day following your procedure to check on you and address any questions or concerns that you may have regarding the information given to you following your procedure. If we do not reach you, we will leave a message.  However, if you are feeling well and you are not experiencing any problems, there is no need to return our call.  We will assume that you have returned to your regular daily activities without incident.  If any biopsies were taken you will be contacted by phone or by letter within the next 1-3 weeks.  Please call us at [phone] if you have not heard about the biopsies in 3 weeks.    SIGNATURES/CONFIDENTIALITY: You and/or your care partner have signed paperwork which will be entered into your electronic medical record.  These signatures attest to the fact that that the information above on your After Visit Summary has been reviewed and is understood.  Full responsibility of the confidentiality of this discharge information lies with you and/or your care-partner.   Resume medications. Information given on polyps and diverticulosis. 

## 2017-03-02 NOTE — Progress Notes (Signed)
I have reviewed the patient's medical history in detail and updated the computerized patient record.

## 2017-03-02 NOTE — Progress Notes (Signed)
To Pacu, VSS. Report to [email or web address] 

## 2017-03-03 ENCOUNTER — Telehealth: Payer: Self-pay

## 2017-03-03 NOTE — Telephone Encounter (Signed)
  Follow up Call-  Call back number 03/02/2017  Post procedure Call Back phone  # [phone]  Permission to leave phone message Yes  Some recent data might be hidden     Patient questions:  Do you have a fever, pain , or abdominal swelling? No. Pain Score  0 *  Have you tolerated food without any problems? Yes.    Have you been able to return to your normal activities? Yes.    Do you have any questions about your discharge instructions: Diet   No. Medications  No. Follow up visit  No.  Do you have questions or concerns about your Care? No.  Actions: * If pain score is 4 or above: No action needed, pain <4.  No problems noted per pt. maw

## 2017-03-07 ENCOUNTER — Encounter: Payer: Self-pay | Admitting: Gastroenterology

## 2017-03-09 ENCOUNTER — Other Ambulatory Visit: Payer: Self-pay | Admitting: *Deleted

## 2017-03-09 ENCOUNTER — Telehealth: Payer: Self-pay | Admitting: *Deleted

## 2017-03-09 ENCOUNTER — Other Ambulatory Visit: Payer: Self-pay

## 2017-03-09 MED ORDER — TAMOXIFEN CITRATE 20 MG PO TABS: 20.0000 mg | ORAL_TABLET | Freq: Every day | ORAL | 3 refills | Status: DC

## 2017-03-09 NOTE — Telephone Encounter (Signed)
"  My pharmacy asked me to call office for Tamoxifen refill.  No refills available.  Dr. Virgie Dad original order over a year ago has expired.  Send order to [facility] in [place]."

## 2017-03-17 NOTE — Telephone Encounter (Signed)
See [id number] eRx order for this medicine.

## 2017-05-27 IMAGING — CT CT CHEST W/ CM
2 of 3 series · 15 of 36 positions shown, 18 images · IV contrast (iopamidol)
Comparison: Breast MRI 07/10/2015.

CLINICAL DATA: 7 mm left lower lobe nodule seen on breast MRI

EXAM:
CT CHEST WITH CONTRAST
TECHNIQUE: Multidetector CT imaging of the chest was performed during
intravenous contrast administration.
CONTRAST:  75mL IMAMCP-T22 IOPAMIDOL (IMAMCP-T22) INJECTION 61%

[Series 2: chest with st · axial · 0.74mm/px · z∈[-298,-52]mm · 12 of 145 slices shown, 15 images]
[im 11/145  mediastinal]
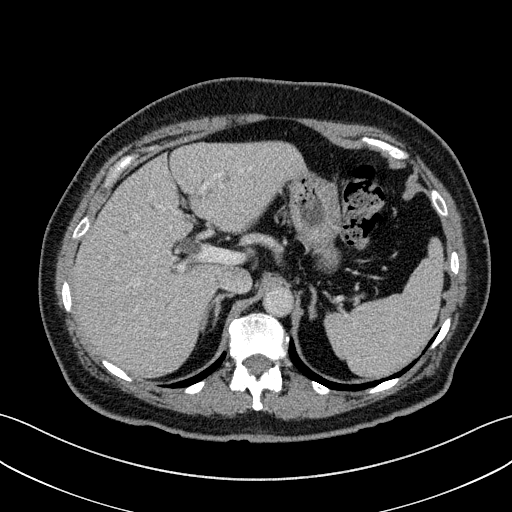
[im 11/145  lung]
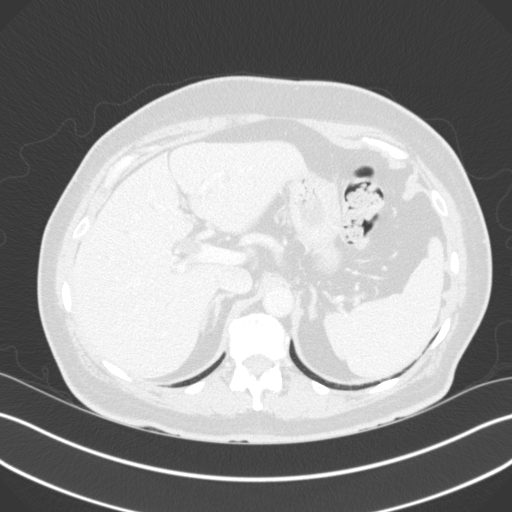
[im 22/145  lung]
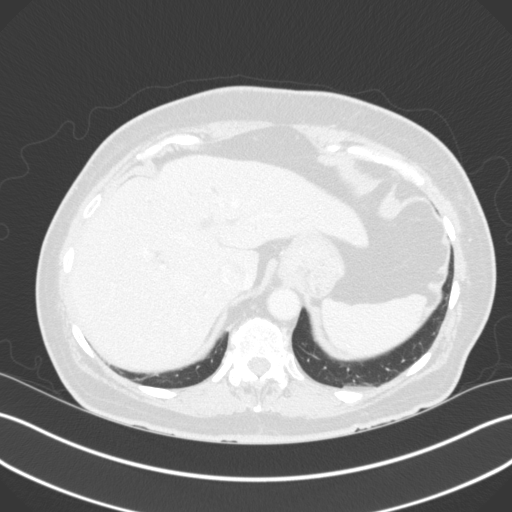
[im 33/145  lung]
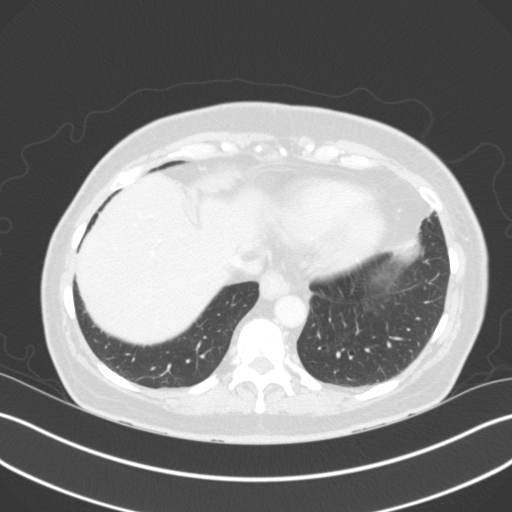
[im 43/145  lung]
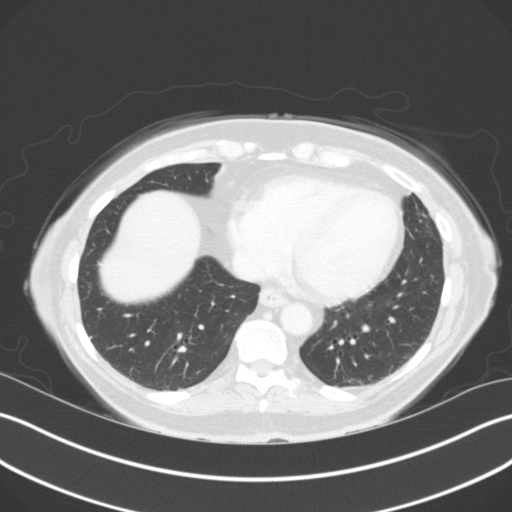
[im 54/145  mediastinal]
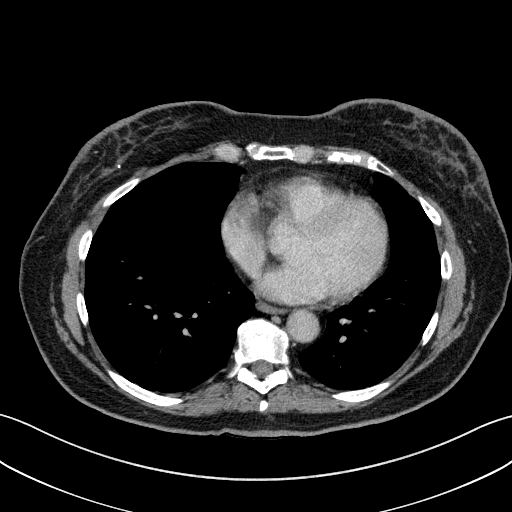
[im 54/145  lung]
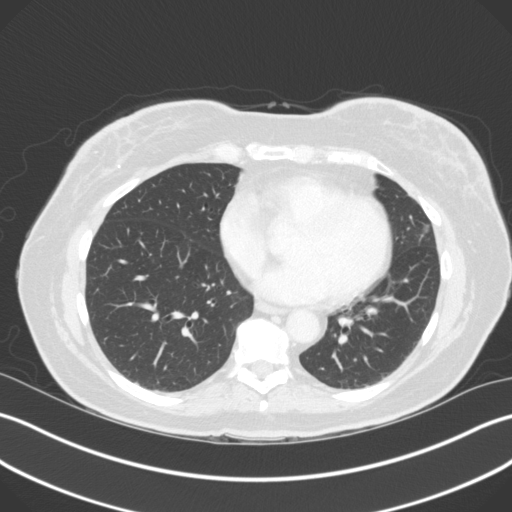
[im 65/145  lung]
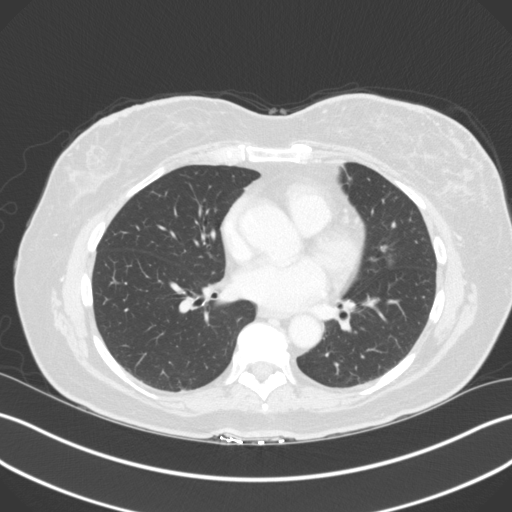
[im 81/145  lung]
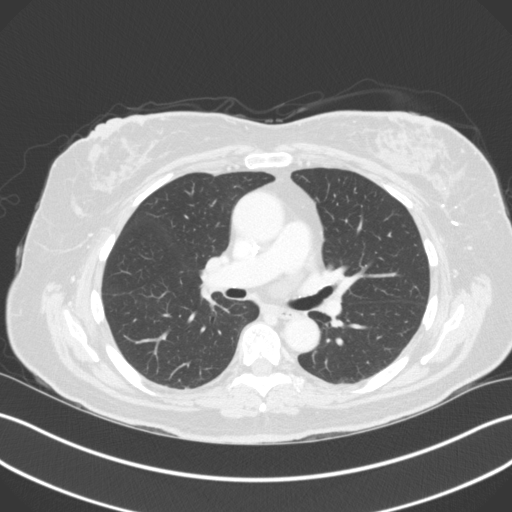
[im 91/145  lung]
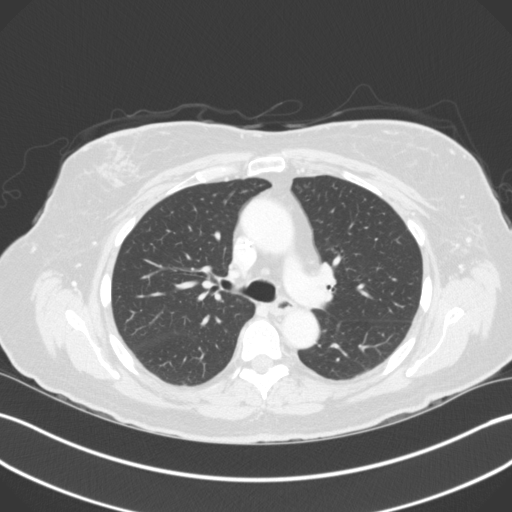
[im 102/145  mediastinal]
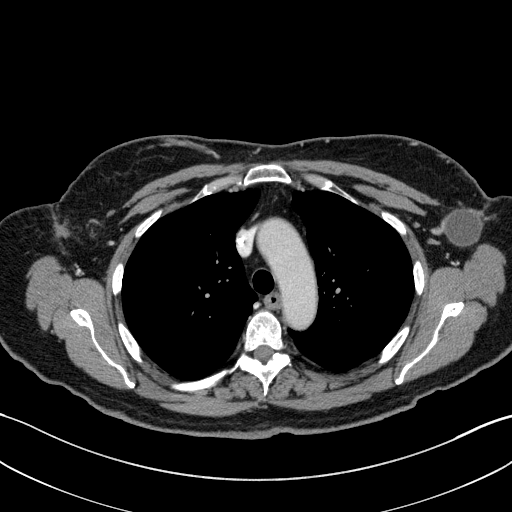
[im 102/145  lung]
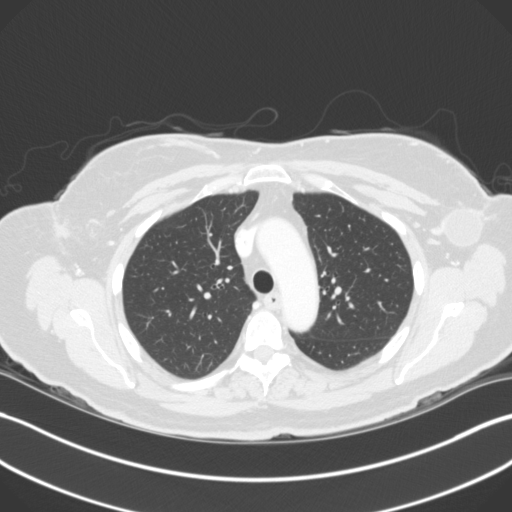
[im 113/145  lung]
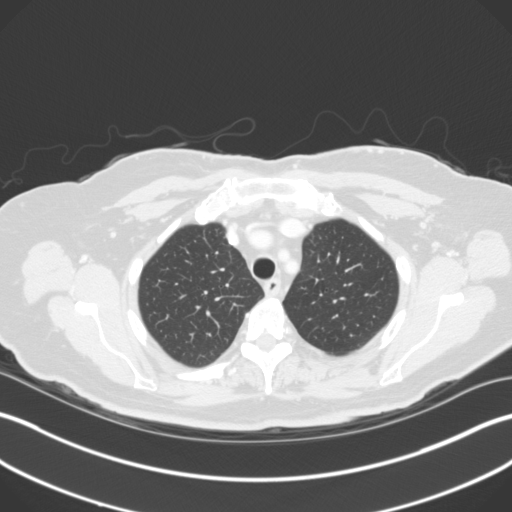
[im 123/145  lung]
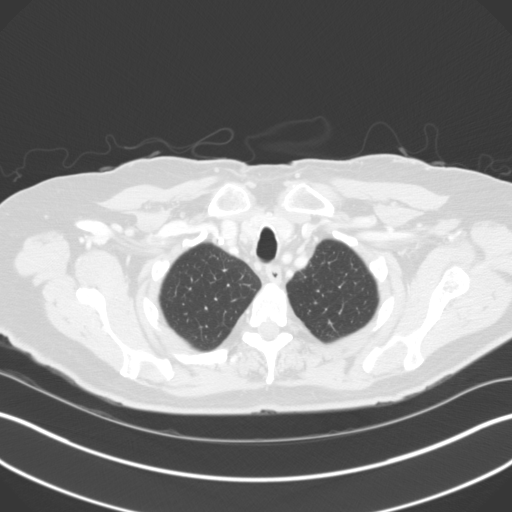
[im 134/145  lung]
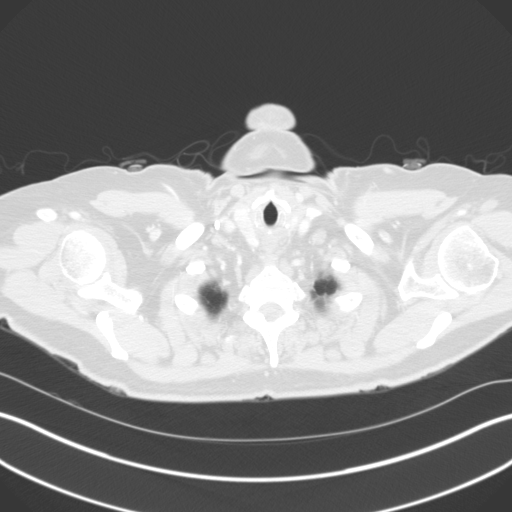

[Series 3: coronals · coronal · 0.59mm/px · 3 of 77 slices shown]
[im 16/77  lung]
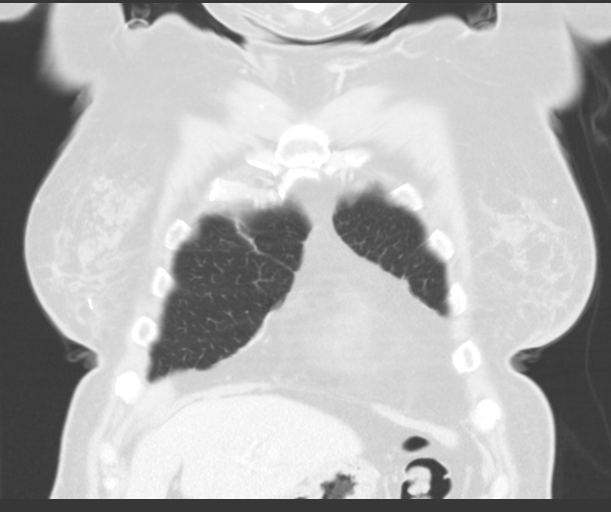
[im 31/77  lung]
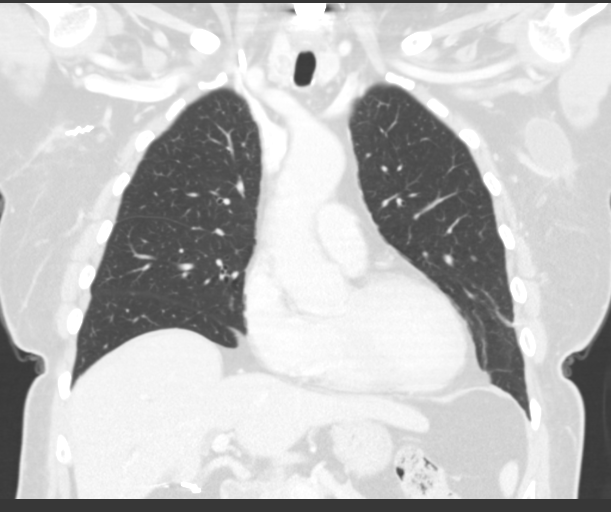
[im 46/77  lung]
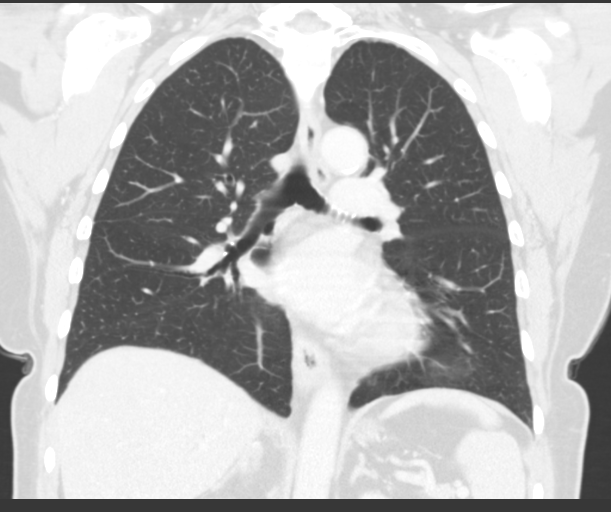

[15 of 36 positions shown; findings below may reference images not displayed]

FINDINGS: Cardiovascular: The heart size is normal. No pericardial effusion.
Coronary artery calcification is noted. No thoracic aortic aneurysm.
Right Port-A-Cath tip is positioned in the mid SVC.

Mediastinum/Nodes: No mediastinal lymphadenopathy. There is no hilar
lymphadenopathy. The esophagus has normal imaging features. Small
lymph nodes are seen in the axillary regions bilaterally. Surgical
clips are noted in the right axilla. 2.8 x 2.7 cm fluid density
lesion in the left axilla is likely a seroma or chronic hematoma
from prior lymphadenectomy.

Lungs/Pleura: No focal airspace consolidation. No pulmonary edema or
pleural effusion. As noted on the previous MRI, any tiny pulmonary
nodules identified in the left lung base, in the lingula. This
measures 6 mm by CT imaging today suggesting no change since the MRI
5 months ago. No other pulmonary nodule or mass.

Upper Abdomen: No focal abnormality identified within the visualized
portions of the liver. Mild prominence of the intra and extrahepatic
bile ducts likely secondary to prior cholecystectomy. Tiny hiatal
hernia noted.

Musculoskeletal: Bone windows reveal no worrisome lytic or sclerotic
osseous lesions.
IMPRESSION: 1. 6 mm nodule identified inferior lingula, corresponding to the
nodule seen on the previous MRI. This is been stable on the 5 month
interval since the prior exam. Non-contrast chest CT at 6-12 months
is recommended. If the nodule is stable at time of repeat CT, then
future CT at 18-24 months (from today's scan) is considered optional
for low-risk patients, but is recommended for high-risk patients.
This recommendation follows the consensus statement: Guidelines for
Management of Incidental Pulmonary Nodules Detected on CT Images:

## 2017-05-31 ENCOUNTER — Other Ambulatory Visit: Payer: Self-pay | Admitting: Oncology

## 2017-05-31 DIAGNOSIS — Z9889 Other specified postprocedural states: Secondary | ICD-10-CM

## 2017-07-21 ENCOUNTER — Ambulatory Visit
Admission: RE | Admit: 2017-07-21 | Discharge: 2017-07-21 | Disposition: A | Discharge status: Home / Self Care | Payer: Medicare Other | Source: Ambulatory Visit | Attending: Oncology | Admitting: Oncology

## 2017-07-21 DIAGNOSIS — Z9889 Other specified postprocedural states: Secondary | ICD-10-CM

## 2017-07-21 HISTORY — DX: Personal history of irradiation: Z92.3

## 2017-07-21 HISTORY — DX: Personal history of antineoplastic chemotherapy: Z92.21

## 2017-08-29 ENCOUNTER — Inpatient Hospital Stay: Payer: Medicare Other | Attending: Oncology

## 2017-08-29 ENCOUNTER — Ambulatory Visit (HOSPITAL_COMMUNITY)
Admission: RE | Admit: 2017-08-29 | Discharge: 2017-08-29 | Disposition: A | Discharge status: Home / Self Care | Payer: Medicare Other | Source: Ambulatory Visit | Attending: Oncology | Admitting: Oncology

## 2017-08-29 DIAGNOSIS — R918 Other nonspecific abnormal finding of lung field: Secondary | ICD-10-CM | POA: Insufficient documentation

## 2017-08-29 DIAGNOSIS — M8580 Other specified disorders of bone density and structure, unspecified site: Secondary | ICD-10-CM | POA: Diagnosis not present

## 2017-08-29 DIAGNOSIS — C50412 Malignant neoplasm of upper-outer quadrant of left female breast: Secondary | ICD-10-CM | POA: Insufficient documentation

## 2017-08-29 DIAGNOSIS — C50511 Malignant neoplasm of lower-outer quadrant of right female breast: Secondary | ICD-10-CM

## 2017-08-29 DIAGNOSIS — I700 Atherosclerosis of aorta: Secondary | ICD-10-CM | POA: Insufficient documentation

## 2017-08-29 DIAGNOSIS — Z170 Estrogen receptor positive status [ER+]: Secondary | ICD-10-CM | POA: Diagnosis not present

## 2017-08-29 DIAGNOSIS — G620 Drug-induced polyneuropathy: Secondary | ICD-10-CM | POA: Insufficient documentation

## 2017-08-29 DIAGNOSIS — T451X5A Adverse effect of antineoplastic and immunosuppressive drugs, initial encounter: Secondary | ICD-10-CM | POA: Diagnosis not present

## 2017-08-29 LAB — CBC WITH DIFFERENTIAL/PLATELET
Basophils Absolute: 0 10*3/uL (ref 0.0–0.1)
Basophils Relative: 1 %
Eosinophils Absolute: 0.2 10*3/uL (ref 0.0–0.5)
Eosinophils Relative: 4 %
HEMATOCRIT: 39.1 % (ref 34.8–46.6)
HEMOGLOBIN: 13.1 g/dL (ref 11.6–15.9)
Lymphocytes Relative: 28 %
Lymphs Abs: 1.2 10*3/uL (ref 0.9–3.3)
MCH: 31.3 pg (ref 25.1–34.0)
MCHC: 33.5 g/dL (ref 31.5–36.0)
MCV: 93.3 fL (ref 79.5–101.0)
MONOS PCT: 14 %
Monocytes Absolute: 0.6 10*3/uL (ref 0.1–0.9)
NEUTROS PCT: 53 %
Neutro Abs: 2.3 10*3/uL (ref 1.5–6.5)
Platelets: 124 10*3/uL — ABNORMAL LOW (ref 145–400)
RBC: 4.19 MIL/uL (ref 3.70–5.45)
RDW: 13.7 % (ref 11.2–14.5)
WBC: 4.2 10*3/uL (ref 3.9–10.3)

## 2017-08-29 LAB — COMPREHENSIVE METABOLIC PANEL
ALK PHOS: 37 U/L — AB (ref 38–126)
ALT: 13 U/L (ref 0–44)
ANION GAP: 7 (ref 5–15)
AST: 16 U/L (ref 15–41)
Albumin: 4 g/dL (ref 3.5–5.0)
BUN: 11 mg/dL (ref 8–23)
CALCIUM: 9.8 mg/dL (ref 8.9–10.3)
CO2: 29 mmol/L (ref 22–32)
Chloride: 106 mmol/L (ref 98–111)
Creatinine, Ser: 0.97 mg/dL (ref 0.44–1.00)
GFR calc Af Amer: >60 mL/min (ref >60)
GFR calc non Af Amer: 56 mL/min — ABNORMAL LOW (ref >60)
GLUCOSE: 97 mg/dL (ref 70–99)
Potassium: 4.4 mmol/L (ref 3.5–5.1)
Sodium: 142 mmol/L (ref 135–145)
TOTAL PROTEIN: 7 g/dL (ref 6.5–8.1)
Total Bilirubin: 0.3 mg/dL (ref 0.3–1.2)

## 2017-08-29 MED ORDER — IOHEXOL 300 MG/ML  SOLN
75.0000 mL | Freq: Once | INTRAMUSCULAR | Status: AC | PRN
  Administered 2017-08-29: 75 mL via INTRAVENOUS

## 2017-08-31 NOTE — Progress Notes (Signed)
Isabella Cook  [phone] [phone]   ID: Isabella Cook DOB: 1940-02-14  MR#: [id number]  HYI#:[id number]  Patient Care Team: [name], FNP as PCP - General (Family Medicine) Zeus Marquis, Virgie Dad, MD as Consulting Physician (Oncology) [name], MD as Consulting Physician (General Surgery) [name], MD as Consulting Physician (Neurosurgery) [name], MD as Referring Physician (Otolaryngology) Bensimhon, Shaune Pascal, MD as Consulting Physician (Cardiology) Delice Bison, Charlestine Massed, NP as Nurse Practitioner (Hematology and Oncology) Armbruster, Carlota Raspberry, MD as Consulting Physician (Gastroenterology) PCP: [name], FNP OTHER MD:  CHIEF COMPLAINT: HER-2 positive breast cancer, bilateral beast cancers  CURRENT TREATMENT: Tamoxifen   BREAST CANCER HISTORY:   From the original intake note:  Isabella Cook had routine screening mammography at [facility] suggesting an area of indeterminate microcalcifications in the upper outer quadrant of the left breast measuring 8 mm. She was referred for left breast stereotactic biopsy performed at the [place] [facility] 02/27/2015. This showed ([id number]) invasive ductal carcinoma, grade 2, estrogen receptor 100% positive, progesterone receptor 100% positive, both with strong staining intensity, with an MIB-1 of 20%, and HER-2 amplified with a signals ratio of 2.53, the number per cell being 2.65. 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  On 03/25/2015 the patient underwent bilateral breast MRI, which showed the breast density to be category B. this showed, in the left breast, an area of non-masslike enhancement measuring 7.8 cm. Associated with this were 2 adjacent irregular masses measuring 1.7 and 1.4 cm. The masses were 5 cm anterior and superior to the biopsy clip. There was a cortically thickened left axillary lymph node noted.  Also in the right breast there was a 7 mm irregular  enhancing mass at the 6:30 o'clock position.  Her subsequent history is as detailed below    INTERVAL HISTORY: Isabella Cook returns today for follow-up and treatment of her estrogen receptor positive breast cancer accompanied by her husband. She continues on tamoxifen, with good tolerance. She denies issues with hot flashes or vaginal dryness.   She completed bilateral mammography with tomography at the [facility] 07/21/2017 showed breast density to be category C. There was no evidence of malignancy.  She also completed CT chest on 08/29/2017 showing: Stable 6 mm lingular nodule, likely benign given a Mohs 2 years of stability. Small scattered subpleural nodules are also stable and likely benign lymph nodes. No new or worrisome pulmonary findings and no mediastinal or hilar mass or adenopathy. Stable surgical changes involving the left breast. No findings for recurrent chest wall tumor, supraclavicular or axillary adenopathy. Aortic Atherosclerosis (ICD10-I70.0).   REVIEW OF SYSTEMS: Isabella Cook reports that she has been reading [place] romance stories. She is being followed by Dr. Lovena Le in 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 She is allergic to mammal meat. She eats chicken, [place] and Location manager (which is $50-60 per pound). She says the ostrich tastes like steak. She also can eat apples. She is limited in vegetables, and she cannot eat broccoli or tomatoes. She takes Benadryl for food related allergies. She denies unusual headaches, visual changes, nausea, vomiting, or dizziness. There has been no unusual cough, phlegm production, or pleurisy. There has been no change in bowel or bladder habits. She denies unexplained fatigue or unexplained weight loss, bleeding, rash, or fever. A detailed review of systems was otherwise stable.    PAST MEDICAL HISTORY: Past Medical History:  Diagnosis Date  . Arthritis    wrists and knees  . Cancer ([place]) 03-2015  left breast  . GERD  (gastroesophageal reflux disease)   . History of hiatal hernia   . History of jaundice as a child   . History of radiation therapy completed 11/17/15   50 Gy to Right Breast, 60 Gy to Left Breast with regional nodal treatment on Left as well.   . Hypothyroidism   . Multiple food allergies   . Personal history of chemotherapy   . Personal history of radiation therapy   . Urinary tract infection     PAST SURGICAL HISTORY: Past Surgical History:  Procedure Laterality Date  . ABDOMINAL HYSTERECTOMY    . APPENDECTOMY    . BACK SURGERY     lumbar  . BREAST LUMPECTOMY Bilateral    Oct. 2017  . BREAST LUMPECTOMY WITH RADIOACTIVE SEED AND SENTINEL LYMPH NODE BIOPSY Bilateral 08/07/2015   Procedure: LEFT BREAST LUMPECTOMY LOCALIZED WITH 2 RADIOACTIVE SEEDS, RIGHT BREAST SEED GUIDED LUMPECTOMY, BILATERAL SENTINEL LYMPH NODE MAPPING;  Surgeon: [name], MD;  Location: [facility];  Service: General;  Laterality: Bilateral;  . BREAST RECONSTRUCTION Bilateral 09/02/2015   Procedure: BILATERAL ONCOPLASTIC RECONSTRUCTION WITH MASTOPEXY;  Surgeon: [name], MD;  Location: [facility];  Service: Plastics;  Laterality: Bilateral;  . CATARACT EXTRACTION    . [place] SURGERY  2016  . CHOLECYSTECTOMY    . COLONOSCOPY  2014  . HERNIA REPAIR     UHR  . MASTOPEXY Bilateral 09/02/2015   Procedure: MASTOPEXY;  Surgeon: [name], MD;  Location: [facility];  Service: Plastics;  Laterality: Bilateral;  . PORT-A-CATH REMOVAL Right 07/19/2016   Procedure: REMOVAL PORT-A-CATH;  Surgeon: [name], MD;  Location: [facility];  Service: General;  Laterality: Right;  ANESTHESIA - MAC/LOCAL  . PORTACATH PLACEMENT Right 04/22/2015   Procedure: INSERTION PORT-A-CATH WITH [place];  Surgeon: [name], MD;  Location: [facility];  Service: General;  Laterality: Right;  . RE-EXCISION OF BREAST LUMPECTOMY Left 09/02/2015   Procedure:  RE-EXCISION OF LEFT BREAST LUMPECTOMY;  Surgeon: [name], MD;  Location: [facility];  Service: General;  Laterality: Left;  . REDUCTION MAMMAPLASTY Bilateral   . TUBAL LIGATION      FAMILY HISTORY Family History  Problem Relation Age of Onset  . Diabetes Mother   . Diabetes Sister   . Colon cancer Neg Hx   . Breast cancer Neg Hx    The patient's father had a history of pseudo-bulbar pulse 51 and died from a stroke at age 41. The patient's mother died at age 76 from complications of emphysema. The patient had no brothers, 2 sisters. There is no history of cancer in the family and specifically no history of breast or ovarian cancer.  GYNECOLOGIC HISTORY:  No LMP recorded. Patient has had a hysterectomy. Menarche age 85, first live birth age 8. She is GX P2. She underwent a hysterectomy with left salpingo-oophorectomy in 1984. She received hormone replacement for approximately 15 years, until 2005.  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 Her husband Isabella Cook is a retired Social research officer, government. Daughter Isabella Cook lives in [place] where she is a housewife, homeschooling her children. Son Isabella Cook lives in [place] and works in Chartered certified accountant. The patient has 2 grandchildren. She is not a church attender    ADVANCED DIRECTIVES: In place   HEALTH MAINTENANCE: Social History   Tobacco Use  . Smoking status: Never  Smoker  . Smokeless tobacco: Never Used  Substance Use Topics  . Alcohol use: Yes    Comment: occ 1 every 2-3 months  . Drug use: No     Colonoscopy:2014  PAP:  Bone density: 2017 [facility]//osteopenia   Lipid panel:  Allergies  Allergen Reactions  . Beef Extract Anaphylaxis    Muscle pain,  Takes claritin every day and benadryl, if needed, to prevent anaphylaxis  Muscle pain,  Takes claritin every day and benadryl, if needed, to prevent anaphylaxis    . Eggs Or Egg-Derived Products Anaphylaxis    Nasal stuffiness, Takes claritin every day and benadryl, if needed, to prevent anaphylaxis Nasal stuffiness, Takes claritin every day and benadryl, if needed, to prevent anaphylaxis  . Gelatin Anaphylaxis    Muscle pain,  Takes claritin every day and benadryl, if needed, to prevent anaphylaxis  . Gluten Meal Anaphylaxis and Diarrhea    Takes claritin every day and benadryl, if needed, to prevent anaphylaxis  Takes claritin every day and benadryl, if needed, to prevent anaphylaxis  . Lactalbumin Anaphylaxis    Nasal stuffiness Cheese (mozzarella, swiss, cheddar, cottage)  . Lactase Anaphylaxis     Takes claritin every day and benadryl, if needed, to prevent anaphylaxis   Takes claritin every day and benadryl, if needed, to prevent anaphylaxis  . Lambs Quarters Anaphylaxis    ALL "mammal" MEAT per pt -- Muscle pain  Can eat chicken, fish, [place]  . Milk-Related Compounds Anaphylaxis and Other (See Comments)    Nasal stuffiness Cheese (mozzarella, swiss, cheddar, cottage) Nasal stuffiness  . Pork Allergy Anaphylaxis    ALL "mammal" MEAT per pt -- Muscle pain  Can eat chicken, fish, [place] ALL "mammal" MEAT per pt -- Muscle pain  Can eat chicken, fish, [place]  . Pork-Derived Products Anaphylaxis    ALL "mammal" MEAT per pt -- Muscle pain  Can eat chicken, fish, [place]  . Poultry Meal Anaphylaxis    Nasal stuffiness, Takes claritin every day and benadryl, if needed, to prevent anaphylaxis Nasal stuffiness, Takes claritin every day and benadryl, if needed, to prevent anaphylaxis Nasal stuffiness, Takes claritin every day and benadryl, if needed, to prevent anaphylaxis   . Sulfa Antibiotics Anaphylaxis    ALL "mammal" MEAT per pt -- Muscle pain  Can eat chicken, fish, [place]  . Wheat Bran Anaphylaxis and Diarrhea     Takes claritin every day and benadryl, if needed, to prevent anaphylaxis   Takes claritin every day and benadryl, if needed,  to prevent anaphylaxis  . Whey Anaphylaxis and Diarrhea     Takes claritin every day and benadryl, if needed, to prevent anaphylaxis  Nasal stuffiness Cheese (mozzarella, swiss, cheddar, cottage)  Takes claritin every day and benadryl, if needed, to prevent anaphylaxis  . Yeast Anaphylaxis     Takes claritin every day and benadryl, if needed, to prevent anaphylaxis  . Black Walnut Pollen Other (See Comments)  . Soy Allergy Diarrhea    Other reaction(s): OTHER  . Almond Oil Nausea And Vomiting  . Other     Multiple food allergies  . Peanut Oil     Current Outpatient Medications  Medication Sig Dispense Refill  . Biotin 5000 MCG CAPS Take 5,000 mcg by mouth.    . calcium carbonate (TUMS - DOSED IN MG ELEMENTAL CALCIUM) 500 MG chewable tablet Chew 500 mg by mouth daily as needed for indigestion or heartburn.     . Cyanocobalamin (VITAMIN B-12 PO) Take 5 mg by mouth  daily.    . diphenhydrAMINE (SOMINEX) 25 MG tablet Take 25 mg by mouth at bedtime as needed. Reported on 05/20/2015    . estradiol (ESTRING) 2 MG vaginal ring Place 2 mg vaginally every 3 (three) months. follow package directions    . famotidine (PEPCID) 20 MG tablet Take 20 mg by mouth daily.     . Liothyronine Sodium POWD 5 mcg by Does not apply route.    . loratadine (CLARITIN) 10 MG tablet Take 10 mg by mouth daily.    . Magnesium Citrate 100 MG TABS Take 400 mg by mouth daily.     . Methylcellulose, Laxative, (CITRUCEL) 500 MG TABS Take 2,000 mg by mouth 2 (two) times daily.    . naproxen sodium (ANAPROX) 220 MG tablet Take 440 mg by mouth 3 (three) times daily with meals as needed.     . Probiotic Product (ALIGN PO) Take 1 tablet by mouth daily.     Marland Kitchen QUERCETIN PO Take 1 tablet by mouth once a week.    Marland Kitchen SACCHAROMYCES BOULARDII PO Take by mouth daily.    . tamoxifen (NOLVADEX) 20 MG tablet Take 1 tablet (20 mg total) by mouth daily. 90 tablet 3  . VITAMIN A PO Take 5,000 Units by mouth daily.    Marland Kitchen VITAMIN D-VITAMIN K  PO Take 1 capsule by mouth daily. Reported on 04/15/2015     Current Facility-Administered Medications  Medication Dose Route Frequency Provider Last Rate Last Dose  . 0.9 %  sodium chloride infusion  500 mL Intravenous Once Armbruster, Carlota Raspberry, MD        OBJECTIVE: Middle-aged white woman in no acute distress  Vitals:   09/01/17 1406  BP: 137/71  Pulse: 76  Resp: 14  Temp: 98.4 F (36.9 C)  SpO2: 97%     Body mass index is 31.9 kg/m.    ECOG FS:1 - Symptomatic but completely ambulatory   Sclerae unicteric, pupils round and equal Oropharynx clear and moist No cervical or supraclavicular adenopathy Lungs no rales or rhonchi Heart regular rate and rhythm Abd soft, nontender, positive bowel sounds MSK no focal spinal tenderness, no upper extremity lymphedema Neuro: nonfocal, well oriented, appropriate affect Breasts: Status post bilateral lumpectomies with bilateral reduction mammoplasties and bilateral radiation.  I do not palpate any findings suggestive of active disease.  Both axillae are benign.  LAB RESULTS:  CMP     Component Value Date/Time   NA 142 08/29/2017 1047   NA 141 09/14/2016 1340   K 4.4 08/29/2017 1047   K 4.2 09/14/2016 1340   CL 106 08/29/2017 1047   CO2 29 08/29/2017 1047   CO2 26 09/14/2016 1340   GLUCOSE 97 08/29/2017 1047   GLUCOSE 113 09/14/2016 1340   BUN 11 08/29/2017 1047   BUN 11.7 09/14/2016 1340   CREATININE 0.97 08/29/2017 1047   CREATININE 1.0 09/14/2016 1340   CALCIUM 9.8 08/29/2017 1047   CALCIUM 9.6 09/14/2016 1340   PROT 7.0 08/29/2017 1047   PROT 6.5 09/14/2016 1340   ALBUMIN 4.0 08/29/2017 1047   ALBUMIN 3.5 09/14/2016 1340   AST 16 08/29/2017 1047   AST 15 09/14/2016 1340   ALT 13 08/29/2017 1047   ALT 15 09/14/2016 1340   ALKPHOS 37 (L) 08/29/2017 1047   ALKPHOS 36 (L) 09/14/2016 1340   BILITOT 0.3 08/29/2017 1047   BILITOT <0.22 09/14/2016 1340   GFRNONAA 56 (L) 08/29/2017 1047   GFRAA >60 08/29/2017 1047    INo  results found for: SPEP, UPEP  Lab Results  Component Value Date   WBC 4.2 08/29/2017   NEUTROABS 2.3 08/29/2017   HGB 13.1 08/29/2017   HCT 39.1 08/29/2017   MCV 93.3 08/29/2017   PLT 124 (L) 08/29/2017      Chemistry      Component Value Date/Time   NA 142 08/29/2017 1047   NA 141 09/14/2016 1340   K 4.4 08/29/2017 1047   K 4.2 09/14/2016 1340   CL 106 08/29/2017 1047   CO2 29 08/29/2017 1047   CO2 26 09/14/2016 1340   BUN 11 08/29/2017 1047   BUN 11.7 09/14/2016 1340   CREATININE 0.97 08/29/2017 1047   CREATININE 1.0 09/14/2016 1340      Component Value Date/Time   CALCIUM 9.8 08/29/2017 1047   CALCIUM 9.6 09/14/2016 1340   ALKPHOS 37 (L) 08/29/2017 1047   ALKPHOS 36 (L) 09/14/2016 1340   AST 16 08/29/2017 1047   AST 15 09/14/2016 1340   ALT 13 08/29/2017 1047   ALT 15 09/14/2016 1340   BILITOT 0.3 08/29/2017 1047   BILITOT <0.22 09/14/2016 1340       No results found for: LABCA2  No components found for: [id number]  No results for input(s): INR in the last 168 hours.  Urinalysis    Component Value Date/Time   LABSPEC 1.005 12/12/2015 1410   PHURINE 6.0 12/12/2015 1410   GLUCOSEU Negative 12/12/2015 1410   HGBUR Negative 12/12/2015 1410   BILIRUBINUR Negative 12/12/2015 1410   KETONESUR Negative 12/12/2015 1410   PROTEINUR Negative 12/12/2015 1410   UROBILINOGEN 0.2 12/12/2015 1410   NITRITE Negative 12/12/2015 1410   LEUKOCYTESUR Small 12/12/2015 1410     ELIGIBLE FOR AVAILABLE RESEARCH PROTOCOL: not a PALLAS trial candidate as HER-2 positive   STUDIES: Bilateral mammography with tomography at the [facility] 07/21/2017 showed breast density to be category C. There was no evidence of malignancy.  Ct Chest W Contrast  Result Date: 08/29/2017 CLINICAL DATA:  Followup pulmonary nodule. EXAM: CT CHEST WITH CONTRAST TECHNIQUE: Multidetector CT imaging of the chest was performed during intravenous contrast administration. CONTRAST:  54m OMNIPAQUE  IOHEXOL 300 MG/ML  SOLN COMPARISON:  Chest CT 12/12/2015 FINDINGS: Cardiovascular: The heart is normal in size stable. No pericardial effusion. There is mild tortuosity, ectasia and calcification of the thoracic aorta but no focal aneurysm or dissection. The branch vessels are patent. Stable scattered coronary artery calcifications. Mediastinum/Nodes: No mediastinal or hilar mass or lymphadenopathy. Small scattered lymph nodes are stable. The esophagus is grossly normal. Small hiatal hernia. Lungs/Pleura: Stable 6 mm lingular nodule, likely benign given almost 2 years of stability. Small scattered subpleural nodules are stable and likely benign lymph nodes. A few of these are calcified. No new worrisome pulmonary lesions and no acute pulmonary findings. Upper Abdomen: No significant upper abdominal findings. No hepatic or adrenal gland metastasis. Chest wall/musculoskeletal: Stable surgical changes involving the left breast. No findings suspicious for recurrent tumor or chest wall disease. No supraclavicular or axillary adenopathy. Left breast skin thickening likely from radiation treatment. Stable right thyroid lobe nodules. No significant bony findings. No lytic or sclerotic bone lesions to suggest metastatic disease. Cervical fusion hardware is noted incidentally. IMPRESSION: 1. Stable 6 mm lingular nodule, likely benign given a Mohs 2 years of stability. 2. Small scattered subpleural nodules are also stable and likely benign lymph nodes. 3. No new or worrisome pulmonary findings and no mediastinal or hilar mass or adenopathy. 4. Stable surgical changes involving  the left breast. No findings for recurrent chest wall tumor, supraclavicular or axillary adenopathy. Aortic Atherosclerosis (ICD10-I70.0). Electronically Signed   By: Marijo Sanes M.D.   On: 08/29/2017 15:44     ASSESSMENT: 77 [facility].o. [facility] woman  status post left breast upper outer quadrant biopsy 02/27/2015 for a clinical mT1-3 N1 invasive  ductal carcinoma, grade 2, strongly estrogen and progesterone receptor positive, HER-2 amplified, with an MIB-1 of 20%.   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  (b) right breast lower outer quadrant: a clinical T1b N0, stage IA invasive ductal carcinoma, estrogen and progesterone receptor positive, HER-2 not amplified, with an Mib-1 of 4%  (2)  neoadjuvant chemotherapy consisting of weekly paclitaxel 12 together with weekly trastuzumab started 04/29/2015  (a) 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  (4) on 08/07/2015 she underwent  (a) right lumpectomy and sentinel lymph node sampling showing ypT0 ypN0 (complete pathologic response)  (b) two left lumpectomies, for ympT2 ypN1 invasive ductal carcinoma, grade 1, with positive margins.  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 The Right breast was treated to 50 Gy in 25 fractions at 2 Gy per fraction. 2. The Left breast (4-field) was treated to 50 Gy in 25 fractions at 2 Gy per fraction. 3. The Left breast was boosted to 10 Gy in 5 fractions at 2 Gy per fraction.   (7). Tamoxifen started 12/23/2015 --  (a)  discontinued Estring as of 05/05/2015, resumed January 2018 under cover of tamoxifen  (8) incidentally noted left lower lobe 0.7 cm nodule on 07/10/2015 MRI , requiring eventual follow-up  (a) repeat CT scan of the chest 12/12/2015 showed the nodule unchanged (0.6 cm)  (b) repeat chest CT scan 08/29/2017 entirely stable: No further evaluation needed  PLAN: Isabella Cook is now 2 years out from definitive surgery for breast cancer with no evidence of disease recurrence.  This is very favorable.  She is tolerating tamoxifen well and the plan will be to continue that a minimum of 5 years.  We reviewed her mammogram results which shows breast density category C.  We discussed the possibility of doing yearly MRI.  She very much would like to do far.  We also reviewed her CT of the chest, which is very stable.  No further CT scanning is needed in the absence of any specific symptoms to evaluate  At this point I am comfortable seeing her on a once a year basis.  She knows to call for any other problems that may develop before the next visit.  Isabella Cook, Virgie Dad, MD  09/01/17 2:28 PM Medical Oncology and Hematology [facility] [address] [place], [place] [address] Tel. [phone]    Fax. [phone]  I,  Sheron Nightingale, am acting as scribe for [name] MD.  I, Lurline Del MD, have reviewed the above documentation for accuracy and completeness, and I agree with the above.

## 2017-09-01 ENCOUNTER — Inpatient Hospital Stay (HOSPITAL_BASED_OUTPATIENT_CLINIC_OR_DEPARTMENT_OTHER): Payer: Medicare Other | Admitting: Oncology

## 2017-09-01 ENCOUNTER — Telehealth: Payer: Self-pay | Admitting: Oncology

## 2017-09-01 VITALS — BP 137/71 | HR 76 | Temp 98.4°F | Resp 14 | Ht 63.0 in | Wt 180.1 lb

## 2017-09-01 DIAGNOSIS — I700 Atherosclerosis of aorta: Secondary | ICD-10-CM | POA: Diagnosis not present

## 2017-09-01 DIAGNOSIS — C50412 Malignant neoplasm of upper-outer quadrant of left female breast: Secondary | ICD-10-CM | POA: Diagnosis not present

## 2017-09-01 DIAGNOSIS — Z170 Estrogen receptor positive status [ER+]: Secondary | ICD-10-CM | POA: Diagnosis not present

## 2017-09-01 DIAGNOSIS — M8580 Other specified disorders of bone density and structure, unspecified site: Secondary | ICD-10-CM

## 2017-09-01 DIAGNOSIS — C50511 Malignant neoplasm of lower-outer quadrant of right female breast: Secondary | ICD-10-CM | POA: Diagnosis not present

## 2017-09-01 NOTE — Telephone Encounter (Signed)
Gave pt avs and calendar  °

## 2018-03-01 ENCOUNTER — Other Ambulatory Visit: Payer: Self-pay | Admitting: Oncology

## 2018-05-13 ENCOUNTER — Encounter

## 2018-06-07 ENCOUNTER — Other Ambulatory Visit: Payer: Self-pay | Admitting: Oncology

## 2018-06-07 DIAGNOSIS — Z853 Personal history of malignant neoplasm of breast: Secondary | ICD-10-CM

## 2018-06-24 ENCOUNTER — Other Ambulatory Visit: Payer: Self-pay | Admitting: Oncology

## 2018-08-01 ENCOUNTER — Ambulatory Visit
Admission: RE | Admit: 2018-08-01 | Discharge: 2018-08-01 | Disposition: A | Discharge status: Home / Self Care | Payer: Medicare Other | Source: Ambulatory Visit | Attending: Oncology | Admitting: Oncology

## 2018-08-01 ENCOUNTER — Other Ambulatory Visit: Payer: Self-pay

## 2018-08-01 DIAGNOSIS — Z853 Personal history of malignant neoplasm of breast: Secondary | ICD-10-CM

## 2018-08-28 ENCOUNTER — Telehealth: Payer: Self-pay | Admitting: Oncology

## 2018-08-28 NOTE — Telephone Encounter (Signed)
Called pt per 8/23 sch message- no answer and no vmail . Unable to reach to pt to change appt for 8/27

## 2018-08-30 ENCOUNTER — Other Ambulatory Visit: Payer: Self-pay | Admitting: *Deleted

## 2018-08-30 DIAGNOSIS — Z170 Estrogen receptor positive status [ER+]: Secondary | ICD-10-CM

## 2018-08-30 DIAGNOSIS — C50412 Malignant neoplasm of upper-outer quadrant of left female breast: Secondary | ICD-10-CM

## 2018-08-30 NOTE — Progress Notes (Signed)
[place]  Telephone:([phone]) [phone] Fax:(336) [phone]   ID: Matteson Blue DOB: August 26, 1940  MR#: [id number]  ZMO#:[id number]  Patient Care Team: [name], [place] as PCP - General (Family Medicine) Rehana Uncapher, Virgie Dad, MD as Consulting Physician (Oncology) [name], MD as Consulting Physician (General Surgery) [name], MD as Consulting Physician (Neurosurgery) [name], MD as Referring Physician (Otolaryngology) Bensimhon, Shaune Pascal, MD as Consulting Physician (Cardiology) Delice Bison, Charlestine Massed, NP as Nurse Practitioner (Hematology and Oncology) Armbruster, Carlota Raspberry, MD as Consulting Physician (Gastroenterology) OTHER MD:  CHIEF COMPLAINT: HER-2 positive breast cancer, bilateral beast cancers  CURRENT TREATMENT: Tamoxifen   INTERVAL HISTORY: Laiah returns today for follow-up and treatment of her estrogen receptor positive breast cancer.   She continues on tamoxifen, with good tolerance. She denies issues with hot flashes.  She is using Estring for the vaginal dryness issues and that is working well.  She is having a little bit of hair thinning.  Otherwise she tolerates it with no significant issues  Since her last visit, she underwent bilateral diagnostic mammography with tomography at [facility] on 08/01/2018 showing: breast density category C; no evidence of malignancy in either breast.  REVIEW OF SYSTEMS: Bao gets about 3000 steps a day unless she walks her dog in which case that she gets another thousand.  She does housework.  Otherwise she does not exercise.  She and her husband are taking good pandemic precautions although the patient's daughter and 2 grandchildren are going to be moving in temporarily while the daughter builds a new home.  A detailed review of systems today was otherwise benign.  BREAST CANCER HISTORY:   From the original intake note:  Eufelia had routine screening mammography at [facility] suggesting an area of  indeterminate microcalcifications in the upper outer quadrant of the left breast measuring 8 mm. She was referred for left breast stereotactic biopsy performed at the [facility] 02/27/2015. This showed ([id number]) invasive ductal carcinoma, grade 2, estrogen receptor 100% positive, progesterone receptor 100% positive, both with strong staining intensity, with an MIB-1 of 20%, and HER-2 amplified with a signals ratio of 2.53, the number per cell being 2.65. 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  On 03/25/2015 the patient underwent bilateral breast MRI, which showed the breast density to be category B. this showed, in the left breast, an area of non-masslike enhancement measuring 7.8 cm. Associated with this were 2 adjacent irregular masses measuring 1.7 and 1.4 cm. The masses were 5 cm anterior and superior to the biopsy clip. There was a cortically thickened left axillary lymph node noted.  Also in the right breast there was a 7 mm irregular enhancing mass at the 6:30 o'clock position.  Her subsequent history is as detailed below    PAST MEDICAL HISTORY: Past Medical History:  Diagnosis Date  . Arthritis    wrists and knees  . Cancer ([place]) 03-2015   left breast  . GERD (gastroesophageal reflux disease)   . History of hiatal hernia   . History of jaundice as a child   . History of radiation therapy completed 11/17/15   50 Gy to Right Breast, 60 Gy to Left Breast with regional nodal treatment on Left as well.   . Hypothyroidism   . Multiple food allergies   . Personal history of chemotherapy   . Personal history of radiation therapy   . Urinary tract infection     PAST SURGICAL HISTORY: Past Surgical History:  Procedure Laterality  Date  . ABDOMINAL HYSTERECTOMY    . APPENDECTOMY    . BACK SURGERY     lumbar  . BREAST LUMPECTOMY Bilateral    Oct. 2017  . BREAST LUMPECTOMY WITH RADIOACTIVE SEED AND SENTINEL LYMPH NODE BIOPSY Bilateral 08/07/2015   Procedure: LEFT  BREAST LUMPECTOMY LOCALIZED WITH 2 RADIOACTIVE SEEDS, RIGHT BREAST SEED GUIDED LUMPECTOMY, BILATERAL SENTINEL LYMPH NODE MAPPING;  Surgeon: [name], MD;  Location: [facility];  Service: General;  Laterality: Bilateral;  . BREAST RECONSTRUCTION Bilateral 09/02/2015   Procedure: BILATERAL ONCOPLASTIC RECONSTRUCTION WITH MASTOPEXY;  Surgeon: [name], MD;  Location: [facility];  Service: Plastics;  Laterality: Bilateral;  . CATARACT EXTRACTION    . [place] SURGERY  2016  . CHOLECYSTECTOMY    . COLONOSCOPY  2014  . HERNIA REPAIR     UHR  . MASTOPEXY Bilateral 09/02/2015   Procedure: MASTOPEXY;  Surgeon: [name], MD;  Location: [facility];  Service: Plastics;  Laterality: Bilateral;  . PORT-A-CATH REMOVAL Right 07/19/2016   Procedure: REMOVAL PORT-A-CATH;  Surgeon: [name], MD;  Location: [facility];  Service: General;  Laterality: Right;  ANESTHESIA - MAC/LOCAL  . PORTACATH PLACEMENT Right 04/22/2015   Procedure: INSERTION PORT-A-CATH WITH [place];  Surgeon: [name], MD;  Location: [facility];  Service: General;  Laterality: Right;  . RE-EXCISION OF BREAST LUMPECTOMY Left 09/02/2015   Procedure: RE-EXCISION OF LEFT BREAST LUMPECTOMY;  Surgeon: [name], MD;  Location: [facility];  Service: General;  Laterality: Left;  . REDUCTION MAMMAPLASTY Bilateral   . TUBAL LIGATION      FAMILY HISTORY Family History  Problem Relation Age of Onset  . Diabetes Mother   . Diabetes Sister   . Colon cancer Neg Hx   . Breast cancer Neg Hx    The patient's father had a history of pseudo-bulbar pulse 23 and died from a stroke at age 54. The patient's mother died at age 63 from complications of emphysema. The patient had no brothers, 2 sisters. There is no history of cancer in the family and specifically no history of breast or ovarian cancer.   GYNECOLOGIC HISTORY:  No LMP recorded. Patient has had  a hysterectomy. Menarche age 70, first live birth age 73. She is GX P2. She underwent a hysterectomy with left salpingo-oophorectomy in 1984. She received hormone replacement for approximately 15 years, until 2005.   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 Her husband [name] is a retired Social research officer, government. Daughter [name] lives in [place] where she is a housewife, homeschooling her children. Son Soledad Budreau lives in [place] and works in Chartered certified accountant. The patient has 2 grandchildren. She is not a church attender    ADVANCED DIRECTIVES: In place   HEALTH MAINTENANCE: Social History   Tobacco Use  . Smoking status: Never Smoker  . Smokeless tobacco: Never Used  Substance Use Topics  . Alcohol use: Yes    Comment: occ 1 every 2-3 months  . Drug use: No     Colonoscopy:2014  PAP:  Bone density: 2017 [facility]//osteopenia   Lipid panel:  Allergies  Allergen Reactions  . Beef Extract Anaphylaxis    Muscle pain,  Takes claritin every day and benadryl, if needed, to prevent anaphylaxis  Muscle pain,  Takes claritin every day and benadryl, if needed, to prevent anaphylaxis  . Eggs Or Egg-Derived Products  Anaphylaxis    Nasal stuffiness, Takes claritin every day and benadryl, if needed, to prevent anaphylaxis Nasal stuffiness, Takes claritin every day and benadryl, if needed, to prevent anaphylaxis  . Gelatin Anaphylaxis    Muscle pain,  Takes claritin every day and benadryl, if needed, to prevent anaphylaxis  . Gluten Meal Anaphylaxis and Diarrhea    Takes claritin every day and benadryl, if needed, to prevent anaphylaxis  Takes claritin every day and benadryl, if needed, to prevent anaphylaxis  . Lactalbumin Anaphylaxis    Nasal stuffiness Cheese (mozzarella, swiss, cheddar, cottage)  . Lactase Anaphylaxis     Takes claritin every day and benadryl, if needed, to prevent  anaphylaxis   Takes claritin every day and benadryl, if needed, to prevent anaphylaxis  . Lambs Quarters Anaphylaxis    ALL "mammal" MEAT per pt -- Muscle pain  Can eat chicken, fish, [place]  . Milk-Related Compounds Anaphylaxis and Other (See Comments)    Nasal stuffiness Cheese (mozzarella, swiss, cheddar, cottage) Nasal stuffiness  . Pork Allergy Anaphylaxis    ALL "mammal" MEAT per pt -- Muscle pain  Can eat chicken, fish, [place] ALL "mammal" MEAT per pt -- Muscle pain  Can eat chicken, fish, [place]  . Pork-Derived Products Anaphylaxis    ALL "mammal" MEAT per pt -- Muscle pain  Can eat chicken, fish, [place]  . Poultry Meal Anaphylaxis    Nasal stuffiness, Takes claritin every day and benadryl, if needed, to prevent anaphylaxis Nasal stuffiness, Takes claritin every day and benadryl, if needed, to prevent anaphylaxis Nasal stuffiness, Takes claritin every day and benadryl, if needed, to prevent anaphylaxis   . Sulfa Antibiotics Anaphylaxis    ALL "mammal" MEAT per pt -- Muscle pain  Can eat chicken, fish, [place]  . Wheat Bran Anaphylaxis and Diarrhea     Takes claritin every day and benadryl, if needed, to prevent anaphylaxis   Takes claritin every day and benadryl, if needed, to prevent anaphylaxis  . Whey Anaphylaxis and Diarrhea     Takes claritin every day and benadryl, if needed, to prevent anaphylaxis  Nasal stuffiness Cheese (mozzarella, swiss, cheddar, cottage)  Takes claritin every day and benadryl, if needed, to prevent anaphylaxis  . Yeast Anaphylaxis     Takes claritin every day and benadryl, if needed, to prevent anaphylaxis  . Black Walnut Pollen Other (See Comments)  . Soy Allergy Diarrhea    Other reaction(s): OTHER  . Almond Oil Nausea And Vomiting  . Other     Multiple food allergies  . Peanut Oil     Current Outpatient Medications  Medication Sig Dispense Refill  . Biotin 5000 MCG CAPS Take 5,000 mcg by mouth.    . calcium carbonate (TUMS - DOSED IN  MG ELEMENTAL CALCIUM) 500 MG chewable tablet Chew 500 mg by mouth daily as needed for indigestion or heartburn.     . Cyanocobalamin (VITAMIN B-12 PO) Take 5 mg by mouth daily.    . diphenhydrAMINE (SOMINEX) 25 MG tablet Take 25 mg by mouth at bedtime as needed. Reported on 05/20/2015    . estradiol (ESTRING) 2 MG vaginal ring Place 2 mg vaginally every 3 (three) months. follow package directions    . famotidine (PEPCID) 20 MG tablet Take 20 mg by mouth daily.     . Liothyronine Sodium POWD 5 mcg by Does not apply route.    . loratadine (CLARITIN) 10 MG tablet Take 10 mg by mouth daily.    . Magnesium Citrate 100 MG  TABS Take 400 mg by mouth daily.     . Methylcellulose, Laxative, (CITRUCEL) 500 MG TABS Take 2,000 mg by mouth 2 (two) times daily.    . naproxen sodium (ANAPROX) 220 MG tablet Take 440 mg by mouth 3 (three) times daily with meals as needed.     . Probiotic Product (ALIGN PO) Take 1 tablet by mouth daily.     Marland Kitchen QUERCETIN PO Take 1 tablet by mouth once a week.    Marland Kitchen SACCHAROMYCES BOULARDII PO Take by mouth daily.    . tamoxifen (NOLVADEX) 20 MG tablet TAKE 1 TABLET BY MOUTH EVERY DAY 90 tablet 0  . VITAMIN A PO Take 5,000 Units by mouth daily.    Marland Kitchen VITAMIN D-VITAMIN K PO Take 1 capsule by mouth daily. Reported on 04/15/2015     Current Facility-Administered Medications  Medication Dose Route Frequency Provider Last Rate Last Dose  . 0.9 %  sodium chloride infusion  500 mL Intravenous Once Armbruster, Carlota Raspberry, MD        OBJECTIVE: Middle-aged white woman who appears stated age  35:   08/31/18 1047  BP: 126/68  Pulse: 76  Resp: 18  Temp: 99.1 F (37.3 C)  SpO2: 97%     Body mass index is 31.21 kg/m.    ECOG FS:1 - Symptomatic but completely ambulatory   Sclerae unicteric, EOMs intact Wearing a mask No cervical or supraclavicular adenopathy Lungs no rales or rhonchi Heart regular rate and rhythm Abd soft, nontender, positive bowel sounds MSK no focal spinal  tenderness, no upper extremity lymphedema Neuro: nonfocal, well oriented, appropriate affect Breasts: Status post bilateral lumpectomies, status post bilateral reduction mammoplasty's, status post bilateral radiation.  There are no skin or nipple changes of concern.  Both axillae are benign.   LAB RESULTS:  CMP     Component Value Date/Time   NA 142 08/29/2017 1047   NA 141 09/14/2016 1340   K 4.4 08/29/2017 1047   K 4.2 09/14/2016 1340   CL 106 08/29/2017 1047   CO2 29 08/29/2017 1047   CO2 26 09/14/2016 1340   GLUCOSE 97 08/29/2017 1047   GLUCOSE 113 09/14/2016 1340   BUN 11 08/29/2017 1047   BUN 11.7 09/14/2016 1340   CREATININE 0.97 08/29/2017 1047   CREATININE 1.0 09/14/2016 1340   CALCIUM 9.8 08/29/2017 1047   CALCIUM 9.6 09/14/2016 1340   PROT 7.0 08/29/2017 1047   PROT 6.5 09/14/2016 1340   ALBUMIN 4.0 08/29/2017 1047   ALBUMIN 3.5 09/14/2016 1340   AST 16 08/29/2017 1047   AST 15 09/14/2016 1340   ALT 13 08/29/2017 1047   ALT 15 09/14/2016 1340   ALKPHOS 37 (L) 08/29/2017 1047   ALKPHOS 36 (L) 09/14/2016 1340   BILITOT 0.3 08/29/2017 1047   BILITOT <0.22 09/14/2016 1340   GFRNONAA 56 (L) 08/29/2017 1047   GFRAA >60 08/29/2017 1047    INo results found for: SPEP, UPEP  Lab Results  Component Value Date   WBC 4.3 08/31/2018   NEUTROABS 2.5 08/31/2018   HGB 12.0 08/31/2018   HCT 36.8 08/31/2018   MCV 93.6 08/31/2018   PLT 146 (L) 08/31/2018      Chemistry      Component Value Date/Time   NA 142 08/29/2017 1047   NA 141 09/14/2016 1340   K 4.4 08/29/2017 1047   K 4.2 09/14/2016 1340   CL 106 08/29/2017 1047   CO2 29 08/29/2017 1047   CO2 26 09/14/2016 1340  BUN 11 08/29/2017 1047   BUN 11.7 09/14/2016 1340   CREATININE 0.97 08/29/2017 1047   CREATININE 1.0 09/14/2016 1340      Component Value Date/Time   CALCIUM 9.8 08/29/2017 1047   CALCIUM 9.6 09/14/2016 1340   ALKPHOS 37 (L) 08/29/2017 1047   ALKPHOS 36 (L) 09/14/2016 1340   AST 16  08/29/2017 1047   AST 15 09/14/2016 1340   ALT 13 08/29/2017 1047   ALT 15 09/14/2016 1340   BILITOT 0.3 08/29/2017 1047   BILITOT <0.22 09/14/2016 1340       No results found for: LABCA2  No components found for: LABCA125  No results for input(s): INR in the last 168 hours.  Urinalysis    Component Value Date/Time   LABSPEC 1.005 12/12/2015 1410   PHURINE 6.0 12/12/2015 1410   GLUCOSEU Negative 12/12/2015 1410   HGBUR Negative 12/12/2015 1410   BILIRUBINUR Negative 12/12/2015 1410   KETONESUR Negative 12/12/2015 1410   PROTEINUR Negative 12/12/2015 1410   UROBILINOGEN 0.2 12/12/2015 1410   NITRITE Negative 12/12/2015 1410   LEUKOCYTESUR Small 12/12/2015 1410     ELIGIBLE FOR AVAILABLE RESEARCH PROTOCOL: not a [facility] trial candidate as HER-2 positive   STUDIES: No results found.   ASSESSMENT: 78 y.o. [facility] woman  status post left breast upper outer quadrant biopsy 02/27/2015 for a clinical mT1-3 N1 invasive ductal carcinoma, grade 2, strongly estrogen and progesterone receptor positive, HER-2 amplified, with an MIB-1 of 20%.   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  (b) right breast lower outer quadrant: a clinical T1b N0, stage IA invasive ductal carcinoma, estrogen and progesterone receptor positive, HER-2 not amplified, with an Mib-1 of 4%  (2)  neoadjuvant chemotherapy consisting of weekly paclitaxel 12 together with weekly trastuzumab started 04/29/2015  (a) 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  (4) on 08/07/2015 she underwent  (a) right lumpectomy and sentinel lymph node sampling showing ypT0 ypN0 (complete pathologic response)  (b) two left lumpectomies, for ympT2 ypN1 invasive ductal carcinoma, grade 1, with positive margins.  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 The Right breast was treated to 50 Gy in 25 fractions at 2 Gy per fraction. 2. The Left breast (4-field) was treated to 50 Gy in 25 fractions at 2 Gy per fraction. 3. The Left breast was boosted to 10 Gy in 5 fractions at 2 Gy per fraction.   (7). Tamoxifen started 12/23/2015 --  (a) on Estring  under cover of tamoxifen  (8) incidentally noted left lower lobe 0.7 cm nodule on 07/10/2015 MRI , requiring eventual follow-up  (a) repeat CT scan of the chest 12/12/2015 showed the nodule unchanged (0.6 cm)  (b) repeat chest CT scan 08/29/2017  entirely stable: No further evaluation needed  PLAN: Vestal is now 3 years out from definitive surgery for her breast cancer with no evidence of disease recurrence.  This is very favorable.  She is tolerating tamoxifen well and the plan is to continue that for a minimum of 5 years.  She is taking appropriate pandemic precautions and I gave her information on the current phase 3 trial using the [place] vaccine  She knows to call for any other issue that may develop before the next visit.  Sabriah Hobbins, Virgie Dad, MD  08/31/18 11:10 AM Medical Oncology and Hematology [facility] [address] [place], [place] [address] Tel. [phone]    Fax. [phone]   I, Wilburn Mylar, am acting as  scribe for Dr. Virgie Dad. Errin Chewning.  I, Lurline Del MD, have reviewed the above documentation for accuracy and completeness, and I agree with the above.

## 2018-08-31 ENCOUNTER — Inpatient Hospital Stay: Payer: Medicare Other | Attending: Oncology

## 2018-08-31 ENCOUNTER — Other Ambulatory Visit: Payer: Self-pay

## 2018-08-31 ENCOUNTER — Inpatient Hospital Stay (HOSPITAL_BASED_OUTPATIENT_CLINIC_OR_DEPARTMENT_OTHER): Payer: Medicare Other | Admitting: Oncology

## 2018-08-31 VITALS — BP 126/68 | HR 76 | Temp 99.1°F | Resp 18 | Ht 63.0 in | Wt 176.2 lb

## 2018-08-31 DIAGNOSIS — Z170 Estrogen receptor positive status [ER+]: Secondary | ICD-10-CM | POA: Diagnosis not present

## 2018-08-31 DIAGNOSIS — C50412 Malignant neoplasm of upper-outer quadrant of left female breast: Secondary | ICD-10-CM | POA: Diagnosis present

## 2018-08-31 DIAGNOSIS — N898 Other specified noninflammatory disorders of vagina: Secondary | ICD-10-CM | POA: Diagnosis not present

## 2018-08-31 DIAGNOSIS — M8580 Other specified disorders of bone density and structure, unspecified site: Secondary | ICD-10-CM | POA: Diagnosis not present

## 2018-08-31 DIAGNOSIS — C50511 Malignant neoplasm of lower-outer quadrant of right female breast: Secondary | ICD-10-CM | POA: Insufficient documentation

## 2018-08-31 LAB — CMP (CANCER CENTER ONLY)
ALT: 12 U/L (ref 0–44)
AST: 15 U/L (ref 15–41)
Albumin: 3.6 g/dL (ref 3.5–5.0)
Alkaline Phosphatase: 34 U/L — ABNORMAL LOW (ref 38–126)
Anion gap: 7 (ref 5–15)
BUN: 12 mg/dL (ref 8–23)
CO2: 25 mmol/L (ref 22–32)
Calcium: 9.1 mg/dL (ref 8.9–10.3)
Chloride: 107 mmol/L (ref 98–111)
Creatinine: 0.93 mg/dL (ref 0.44–1.00)
GFR, Est AFR Am: >60 mL/min (ref >60)
GFR, Estimated: 59 mL/min — ABNORMAL LOW (ref >60)
Glucose, Bld: 130 mg/dL — ABNORMAL HIGH (ref 70–99)
Potassium: 4 mmol/L (ref 3.5–5.1)
Sodium: 139 mmol/L (ref 135–145)
Total Bilirubin: 0.2 mg/dL — ABNORMAL LOW (ref 0.3–1.2)
Total Protein: 6.5 g/dL (ref 6.5–8.1)

## 2018-08-31 LAB — CBC WITH DIFFERENTIAL (CANCER CENTER ONLY)
Abs Immature Granulocytes: 0.01 10*3/uL (ref 0.00–0.07)
Basophils Absolute: 0 10*3/uL (ref 0.0–0.1)
Basophils Relative: 1 %
Eosinophils Absolute: 0.2 10*3/uL (ref 0.0–0.5)
Eosinophils Relative: 4 %
HCT: 36.8 % (ref 36.0–46.0)
Hemoglobin: 12 g/dL (ref 12.0–15.0)
Immature Granulocytes: 0 %
Lymphocytes Relative: 27 %
Lymphs Abs: 1.2 10*3/uL (ref 0.7–4.0)
MCH: 30.5 pg (ref 26.0–34.0)
MCHC: 32.6 g/dL (ref 30.0–36.0)
MCV: 93.6 fL (ref 80.0–100.0)
Monocytes Absolute: 0.4 10*3/uL (ref 0.1–1.0)
Monocytes Relative: 10 %
Neutro Abs: 2.5 10*3/uL (ref 1.7–7.7)
Neutrophils Relative %: 58 %
Platelet Count: 146 10*3/uL — ABNORMAL LOW (ref 150–400)
RBC: 3.93 MIL/uL (ref 3.87–5.11)
RDW: 13.3 % (ref 11.5–15.5)
WBC Count: 4.3 10*3/uL (ref 4.0–10.5)
nRBC: 0 % (ref 0.0–0.2)

## 2018-08-31 MED ORDER — TAMOXIFEN CITRATE 20 MG PO TABS: 20.0000 mg | ORAL_TABLET | Freq: Every day | ORAL | 4 refills | Status: DC

## 2018-09-01 ENCOUNTER — Telehealth: Payer: Self-pay | Admitting: Oncology

## 2018-09-01 NOTE — Telephone Encounter (Signed)
I talk with patient regarding schedule  

## 2019-06-18 ENCOUNTER — Other Ambulatory Visit: Payer: Self-pay | Admitting: Oncology

## 2019-06-18 DIAGNOSIS — Z853 Personal history of malignant neoplasm of breast: Secondary | ICD-10-CM

## 2019-08-03 ENCOUNTER — Other Ambulatory Visit: Payer: Self-pay

## 2019-08-03 ENCOUNTER — Ambulatory Visit
Admission: RE | Admit: 2019-08-03 | Discharge: 2019-08-03 | Disposition: A | Discharge status: Home / Self Care | Payer: Medicare Other | Source: Ambulatory Visit | Attending: Oncology | Admitting: Oncology

## 2019-08-03 DIAGNOSIS — Z853 Personal history of malignant neoplasm of breast: Secondary | ICD-10-CM

## 2019-09-02 NOTE — Progress Notes (Signed)
[place]  [phone] [phone]   ID: Isabella Cook DOB: 01-10-1940  MR#: [id number]  TKW#:[id number]  Patient Care Team: [name], [place] as PCP - General (Family Medicine) Taeshawn Helfman, Virgie Dad, MD as Consulting Physician (Oncology) [name], MD as Consulting Physician (General Surgery) [name], MD as Consulting Physician (Neurosurgery) [name], MD as Referring Physician (Otolaryngology) Bensimhon, Shaune Pascal, MD as Consulting Physician (Cardiology) Delice Bison, Charlestine Massed, NP as Nurse Practitioner (Hematology and Oncology) Armbruster, Carlota Raspberry, MD as Consulting Physician (Gastroenterology) OTHER MD:  CHIEF COMPLAINT: HER-2 positive breast cancer, bilateral beast cancers  CURRENT TREATMENT: Tamoxifen   INTERVAL HISTORY: Isabella Cook returns today for follow-up of her estrogen receptor positive breast cancer accompanied by her son Isabella Cook.  The patient's husband Isabella Cook who usually drives her was just released from the hospital where he underwent his fourth ablation for atrial fibrillation.  He apparently is now in rhythm.  Memorie continues on tamoxifen, with good tolerance.  She has mild hot flashes usually at night but this gets confused with nocturia and her dog waking her up.  Since her last visit, she underwent bilateral diagnostic mammography with tomography at [facility] on 08/03/2019 showing: breast density category C; no evidence of malignancy in either breast.   REVIEW OF SYSTEMS: Isabella [place]  She does not belong to a gym but does have a gym downstairs she says although she does not use it.  She is having pain in the medial aspect of her right knee.  She tells me her shoes are comfortable and supportive.  Aside from this a detailed review of systems today was stable.   BREAST CANCER HISTORY:   From the original intake note:  Isabella Cook had routine screening mammography at  [facility] suggesting an area of indeterminate microcalcifications in the upper outer quadrant of the left breast measuring 8 mm. She was referred for left breast stereotactic biopsy performed at the [facility] 02/27/2015. This showed ([id number]) invasive ductal carcinoma, grade 2, estrogen receptor 100% positive, progesterone receptor 100% positive, both with strong staining intensity, with an MIB-1 of 20%, and HER-2 amplified with a signals ratio of 2.53, the number per cell being 2.65. 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  On 03/25/2015 the patient underwent bilateral breast MRI, which showed the breast density to be category B. this showed, in the left breast, an area of non-masslike enhancement measuring 7.8 cm. Associated with this were 2 adjacent irregular masses measuring 1.7 and 1.4 cm. The masses were 5 cm anterior and superior to the biopsy clip. There was a cortically thickened left axillary lymph node noted.  Also in the right breast there was a 7 mm irregular enhancing mass at the 6:30 o'clock position.  Her subsequent history is as detailed below    PAST MEDICAL HISTORY: Past Medical History:  Diagnosis Date  . Arthritis    wrists and knees  . Cancer ([place]) 03-2015   left breast  . GERD (gastroesophageal reflux disease)   . History of hiatal hernia   . History of jaundice as a child   . History of radiation therapy completed 11/17/15   50 Gy to Right Breast, 60 Gy to Left Breast with regional nodal treatment on Left as well.   . Hypothyroidism   . Multiple food allergies   . Personal history of chemotherapy   . Personal history of radiation therapy   . Urinary tract infection  PAST SURGICAL HISTORY: Past Surgical History:  Procedure Laterality Date  . ABDOMINAL HYSTERECTOMY    . APPENDECTOMY    . BACK SURGERY     lumbar  . BREAST LUMPECTOMY Bilateral    Oct. 2017  . BREAST LUMPECTOMY WITH RADIOACTIVE SEED AND SENTINEL LYMPH NODE BIOPSY  Bilateral 08/07/2015   Procedure: LEFT BREAST LUMPECTOMY LOCALIZED WITH 2 RADIOACTIVE SEEDS, RIGHT BREAST SEED GUIDED LUMPECTOMY, BILATERAL SENTINEL LYMPH NODE MAPPING;  Surgeon: [name], MD;  Location: [place];  Service: General;  Laterality: Bilateral;  . BREAST RECONSTRUCTION Bilateral 09/02/2015   Procedure: BILATERAL ONCOPLASTIC RECONSTRUCTION WITH MASTOPEXY;  Surgeon: [name], MD;  Location: [place];  Service: Plastics;  Laterality: Bilateral;  . CATARACT EXTRACTION    . [place] SURGERY  2016  . CHOLECYSTECTOMY    . COLONOSCOPY  2014  . HERNIA REPAIR     UHR  . MASTOPEXY Bilateral 09/02/2015   Procedure: MASTOPEXY;  Surgeon: [name], MD;  Location: [place];  Service: Plastics;  Laterality: Bilateral;  . PORT-A-CATH REMOVAL Right 07/19/2016   Procedure: REMOVAL PORT-A-CATH;  Surgeon: [name], MD;  Location: [place];  Service: General;  Laterality: Right;  ANESTHESIA - MAC/LOCAL  . PORTACATH PLACEMENT Right 04/22/2015   Procedure: INSERTION PORT-A-CATH WITH [place];  Surgeon: [name], MD;  Location: [place];  Service: General;  Laterality: Right;  . RE-EXCISION OF BREAST LUMPECTOMY Left 09/02/2015   Procedure: RE-EXCISION OF LEFT BREAST LUMPECTOMY;  Surgeon: [name], MD;  Location: [place];  Service: General;  Laterality: Left;  . REDUCTION MAMMAPLASTY Bilateral   . TUBAL LIGATION      FAMILY HISTORY Family History  Problem Relation Age of Onset  . Diabetes Mother   . Diabetes Sister   . Colon cancer Neg Hx   . Breast cancer Neg Hx    The patient's father had a history of pseudo-bulbar pulse 48 and died from a stroke at age 63. The patient's mother died at age 83 from complications of emphysema. The patient had no brothers, 2 sisters. There is no history of cancer in the family and specifically no history of breast or ovarian cancer.   GYNECOLOGIC  HISTORY:  No LMP recorded. Patient has had a hysterectomy. Menarche age 56, first live birth age 50. She is GX P2. She underwent a hysterectomy with left salpingo-oophorectomy in 1984. She received hormone replacement for approximately 15 years, until 2005.   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 Her husband Isabella Cook is a retired Social research officer, government. Daughter Isabella Cook lives in [place] where she is a housewife, homeschooling her children. Son Isabella Cook lives in [place] and works in Chartered certified accountant. The patient has 2 grandchildren. She is not a church attender    ADVANCED DIRECTIVES: In place   HEALTH MAINTENANCE: Social History   Tobacco Use  . Smoking status: Never Smoker  . Smokeless tobacco: Never Used  Vaping Use  . Vaping Use: Never used  Substance Use Topics  . Alcohol use: Yes    Comment: occ 1 every 2-3 months  . Drug use: No     Colonoscopy:2014  PAP:  Bone density: 2017 [facility]//osteopenia   Lipid panel:  Allergies  Allergen Reactions  . Beef (Bovine) Protein Anaphylaxis    Muscle pain,  Takes claritin every day and benadryl, if needed, to prevent anaphylaxis  Muscle  pain,  Takes claritin every day and benadryl, if needed, to prevent anaphylaxis  . Eggs Or Egg-Derived Products Anaphylaxis    Nasal stuffiness, Takes claritin every day and benadryl, if needed, to prevent anaphylaxis Nasal stuffiness, Takes claritin every day and benadryl, if needed, to prevent anaphylaxis  . Gelatin Anaphylaxis    Muscle pain,  Takes claritin every day and benadryl, if needed, to prevent anaphylaxis  . Gluten Meal Anaphylaxis and Diarrhea    Takes claritin every day and benadryl, if needed, to prevent anaphylaxis  Takes claritin every day and benadryl, if needed, to prevent anaphylaxis  . Lactalbumin Anaphylaxis    Nasal stuffiness Cheese (mozzarella, swiss, cheddar, cottage)    . Lactase Anaphylaxis     Takes claritin every day and benadryl, if needed, to prevent anaphylaxis   Takes claritin every day and benadryl, if needed, to prevent anaphylaxis  . Lambs Quarters Anaphylaxis    ALL "mammal" MEAT per pt -- Muscle pain  Can eat chicken, fish, [place]  . Milk-Related Compounds Anaphylaxis and Other (See Comments)    Nasal stuffiness Cheese (mozzarella, swiss, cheddar, cottage) Nasal stuffiness  . Pork Allergy Anaphylaxis    ALL "mammal" MEAT per pt -- Muscle pain  Can eat chicken, fish, [place] ALL "mammal" MEAT per pt -- Muscle pain  Can eat chicken, fish, [place]  . Pork-Derived Products Anaphylaxis    ALL "mammal" MEAT per pt -- Muscle pain  Can eat chicken, fish, [place]  . Poultry Meal Anaphylaxis    Nasal stuffiness, Takes claritin every day and benadryl, if needed, to prevent anaphylaxis Nasal stuffiness, Takes claritin every day and benadryl, if needed, to prevent anaphylaxis Nasal stuffiness, Takes claritin every day and benadryl, if needed, to prevent anaphylaxis   . Sulfa Antibiotics Anaphylaxis    ALL "mammal" MEAT per pt -- Muscle pain  Can eat chicken, fish, [place]  . Wheat Bran Anaphylaxis and Diarrhea     Takes claritin every day and benadryl, if needed, to prevent anaphylaxis   Takes claritin every day and benadryl, if needed, to prevent anaphylaxis  . Whey Anaphylaxis and Diarrhea     Takes claritin every day and benadryl, if needed, to prevent anaphylaxis  Nasal stuffiness Cheese (mozzarella, swiss, cheddar, cottage)  Takes claritin every day and benadryl, if needed, to prevent anaphylaxis  . Yeast Anaphylaxis     Takes claritin every day and benadryl, if needed, to prevent anaphylaxis  . Black Walnut Pollen Other (See Comments)  . Soy Allergy Diarrhea    Other reaction(s): OTHER  . Almond Oil Nausea And Vomiting  . Other     Multiple food allergies  . Peanut Oil     Current Outpatient Medications  Medication Sig Dispense Refill   . Biotin 5000 MCG CAPS Take 5,000 mcg by mouth.    . calcium carbonate (TUMS - DOSED IN MG ELEMENTAL CALCIUM) 500 MG chewable tablet Chew 500 mg by mouth daily as needed for indigestion or heartburn.     . Cyanocobalamin (VITAMIN B-12 PO) Take 5 mg by mouth daily.    . diphenhydrAMINE (SOMINEX) 25 MG tablet Take 25 mg by mouth at bedtime as needed. Reported on 05/20/2015    . estradiol (ESTRING) 2 MG vaginal ring Place 2 mg vaginally every 3 (three) months. follow package directions    . famotidine (PEPCID) 20 MG tablet Take 20 mg by mouth daily.     . Liothyronine Sodium POWD 5 mcg by Does not apply route.    Marland Kitchen  loratadine (CLARITIN) 10 MG tablet Take 10 mg by mouth daily.    . Magnesium Citrate 100 MG TABS Take 400 mg by mouth daily.     . Methylcellulose, Laxative, (CITRUCEL) 500 MG TABS Take 2,000 mg by mouth 2 (two) times daily.    . naproxen sodium (ANAPROX) 220 MG tablet Take 440 mg by mouth 3 (three) times daily with meals as needed.     . Probiotic Product (ALIGN PO) Take 1 tablet by mouth daily.     Marland Kitchen QUERCETIN PO Take 1 tablet by mouth once a week.    Marland Kitchen SACCHAROMYCES BOULARDII PO Take by mouth daily.    . tamoxifen (NOLVADEX) 20 MG tablet Take 1 tablet (20 mg total) by mouth daily. 90 tablet 4  . VITAMIN A PO Take 5,000 Units by mouth daily.    Marland Kitchen VITAMIN D-VITAMIN K PO Take 1 capsule by mouth daily. Reported on 04/15/2015     Current Facility-Administered Medications  Medication Dose Route Frequency Provider Last Rate Last Admin  . 0.9 %  sodium chloride infusion  500 mL Intravenous Once Armbruster, Carlota Raspberry, MD        OBJECTIVE: White woman in no acute distress  Vitals:   09/03/19 1003  BP: (!) 155/74  Pulse: 82  Resp: 17  Temp: 99 F (37.2 C)  SpO2: 97%     Body mass index is 30.73 kg/m.    ECOG FS:1 - Symptomatic but completely ambulatory   Sclerae unicteric, EOMs intact Wearing a mask No cervical or supraclavicular adenopathy Lungs no rales or rhonchi Heart  regular rate and rhythm Abd soft, nontender, positive bowel sounds MSK no focal spinal tenderness, no upper extremity lymphedema Neuro: nonfocal, well oriented, appropriate affect Breasts: Status post bilateral lumpectomies, status post bilateral reduction mammoplasty's and status post bilateral radiation.  There is no evidence of local recurrence.  Both axillae are benign.   LAB RESULTS:  CMP     Component Value Date/Time   NA 139 08/31/2018 1044   NA 141 09/14/2016 1340   K 4.0 08/31/2018 1044   K 4.2 09/14/2016 1340   CL 107 08/31/2018 1044   CO2 25 08/31/2018 1044   CO2 26 09/14/2016 1340   GLUCOSE 130 (H) 08/31/2018 1044   GLUCOSE 113 09/14/2016 1340   BUN 12 08/31/2018 1044   BUN 11.7 09/14/2016 1340   CREATININE 0.93 08/31/2018 1044   CREATININE 1.0 09/14/2016 1340   CALCIUM 9.1 08/31/2018 1044   CALCIUM 9.6 09/14/2016 1340   PROT 6.5 08/31/2018 1044   PROT 6.5 09/14/2016 1340   ALBUMIN 3.6 08/31/2018 1044   ALBUMIN 3.5 09/14/2016 1340   AST 15 08/31/2018 1044   AST 15 09/14/2016 1340   ALT 12 08/31/2018 1044   ALT 15 09/14/2016 1340   ALKPHOS 34 (L) 08/31/2018 1044   ALKPHOS 36 (L) 09/14/2016 1340   BILITOT 0.2 (L) 08/31/2018 1044   BILITOT <0.22 09/14/2016 1340   GFRNONAA 59 (L) 08/31/2018 1044   GFRAA >60 08/31/2018 1044    INo results found for: SPEP, UPEP  Lab Results  Component Value Date   WBC 6.1 09/03/2019   NEUTROABS 3.0 09/03/2019   HGB 12.6 09/03/2019   HCT 38.3 09/03/2019   MCV 93.0 09/03/2019   PLT 142 (L) 09/03/2019      Chemistry      Component Value Date/Time   NA 139 08/31/2018 1044   NA 141 09/14/2016 1340   K 4.0 08/31/2018 1044   K  4.2 09/14/2016 1340   CL 107 08/31/2018 1044   CO2 25 08/31/2018 1044   CO2 26 09/14/2016 1340   BUN 12 08/31/2018 1044   BUN 11.7 09/14/2016 1340   CREATININE 0.93 08/31/2018 1044   CREATININE 1.0 09/14/2016 1340      Component Value Date/Time   CALCIUM 9.1 08/31/2018 1044   CALCIUM 9.6  09/14/2016 1340   ALKPHOS 34 (L) 08/31/2018 1044   ALKPHOS 36 (L) 09/14/2016 1340   AST 15 08/31/2018 1044   AST 15 09/14/2016 1340   ALT 12 08/31/2018 1044   ALT 15 09/14/2016 1340   BILITOT 0.2 (L) 08/31/2018 1044   BILITOT <0.22 09/14/2016 1340       No results found for: LABCA2  No components found for: LABCA125  No results for input(s): INR in the last 168 hours.  Urinalysis    Component Value Date/Time   LABSPEC 1.005 12/12/2015 1410   PHURINE 6.0 12/12/2015 1410   GLUCOSEU Negative 12/12/2015 1410   HGBUR Negative 12/12/2015 1410   BILIRUBINUR Negative 12/12/2015 1410   KETONESUR Negative 12/12/2015 1410   PROTEINUR Negative 12/12/2015 1410   UROBILINOGEN 0.2 12/12/2015 1410   NITRITE Negative 12/12/2015 1410   LEUKOCYTESUR Small 12/12/2015 1410     ELIGIBLE FOR AVAILABLE RESEARCH PROTOCOL: not a P[facility]AS trial candidate as HER-2 positive   STUDIES: No results found.   ASSESSMENT: 79 y.o. [facility] woman  status post left breast upper outer quadrant biopsy 02/27/2015 for a clinical mT1-3 N1 invasive ductal carcinoma, grade 2, strongly estrogen and progesterone receptor positive, HER-2 amplified, with an MIB-1 of 20%.   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  (b) right breast lower outer quadrant: a clinical T1b N0, stage IA invasive ductal carcinoma, estrogen and progesterone receptor positive, HER-2 not amplified, with an Mib-1 of 4%  (2)  neoadjuvant chemotherapy consisting of weekly paclitaxel 12 together with weekly trastuzumab started 04/29/2015  (a) 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  (4) on 08/07/2015 she underwent  (a) right lumpectomy and sentinel lymph node sampling showing ypT0 ypN0 (complete pathologic response)  (b) two left lumpectomies, for ympT2 ypN1 invasive ductal carcinoma, grade 1, with positive margins.  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 The Right breast was treated to 50 Gy in 25 fractions at 2 Gy per fraction. 2. The Left breast (4-field) was treated to 50 Gy in 25 fractions at 2 Gy per fraction. 3. The Left breast was boosted to 10 Gy in 5 fractions at 2 Gy per fraction.   (7). Tamoxifen started 12/23/2015 --  (a) on Estring  under cover of tamoxifen  (8) incidentally noted left lower lobe 0.7 cm nodule on 07/10/2015 MRI , requiring  eventual follow-up  (a) repeat CT scan of the chest 12/12/2015 showed the nodule unchanged (0.6 cm)  (b) repeat chest CT scan 08/29/2017 entirely stable: No further evaluation needed   PLAN: Isabella Cook is now 4 years out from definitive surgery for her breast cancer with no evidence of disease recurrence.  This is very favorable.  She is tolerating tamoxifen well.  When she sees me next year she will be close to 5 years into that drug and she will have the option of discontinuing antiestrogens, continuing tamoxifen an additional 5 years, or switching to anastrozole for an additional 2 years.  To help [place] with that decision she will have a bone density obtained with her next mammogram at the [facility] July 2022  I commended her exercise program and  suggested she read up on knee exercises--I offered her physical therapy for the knee here but she demurred.  She knows to call for any other issue that may develop before the next visit  Total encounter time 25 minutes.*   Isabella Cook, Virgie Dad, MD  09/03/19 10:13 AM Medical Oncology and Hematology [facility] [place], [place] [address] Tel. [phone]    Fax. [phone]   I, Wilburn Mylar, am acting as scribe for Dr. Virgie Dad. Isabella Cook.  I, Lurline Del MD, have reviewed the above documentation for accuracy and completeness, and I agree with the above.   *Total Encounter Time as defined by the Centers for Medicare and Medicaid Services includes, in addition to the face-to-face time of a patient visit (documented in the note above) non-face-to-face time: obtaining and reviewing outside history, ordering and reviewing medications, tests or procedures, care coordination (communications with other health care professionals or caregivers) and documentation in the medical record.

## 2019-09-03 ENCOUNTER — Inpatient Hospital Stay: Payer: Medicare Other

## 2019-09-03 ENCOUNTER — Other Ambulatory Visit: Payer: Self-pay

## 2019-09-03 ENCOUNTER — Other Ambulatory Visit: Payer: Self-pay | Admitting: *Deleted

## 2019-09-03 ENCOUNTER — Inpatient Hospital Stay: Payer: Medicare Other | Attending: Oncology | Admitting: Oncology

## 2019-09-03 VITALS — BP 155/74 | HR 82 | Temp 99.0°F | Resp 17 | Ht 63.0 in | Wt 173.5 lb

## 2019-09-03 DIAGNOSIS — I4891 Unspecified atrial fibrillation: Secondary | ICD-10-CM | POA: Insufficient documentation

## 2019-09-03 DIAGNOSIS — Z9221 Personal history of antineoplastic chemotherapy: Secondary | ICD-10-CM | POA: Diagnosis not present

## 2019-09-03 DIAGNOSIS — R911 Solitary pulmonary nodule: Secondary | ICD-10-CM | POA: Insufficient documentation

## 2019-09-03 DIAGNOSIS — C50412 Malignant neoplasm of upper-outer quadrant of left female breast: Secondary | ICD-10-CM

## 2019-09-03 DIAGNOSIS — C50511 Malignant neoplasm of lower-outer quadrant of right female breast: Secondary | ICD-10-CM | POA: Diagnosis present

## 2019-09-03 LAB — CMP (CANCER CENTER ONLY)
ALT: 12 U/L (ref 0–44)
AST: 17 U/L (ref 15–41)
Albumin: 3.6 g/dL (ref 3.5–5.0)
Alkaline Phosphatase: 37 U/L — ABNORMAL LOW (ref 38–126)
Anion gap: 9 (ref 5–15)
BUN: 12 mg/dL (ref 8–23)
CO2: 27 mmol/L (ref 22–32)
Calcium: 10.4 mg/dL — ABNORMAL HIGH (ref 8.9–10.3)
Chloride: 106 mmol/L (ref 98–111)
Creatinine: 1.06 mg/dL — ABNORMAL HIGH (ref 0.44–1.00)
GFR, Est AFR Am: 58 mL/min — ABNORMAL LOW (ref >60)
GFR, Estimated: 50 mL/min — ABNORMAL LOW (ref >60)
Glucose, Bld: 111 mg/dL — ABNORMAL HIGH (ref 70–99)
Potassium: 4.2 mmol/L (ref 3.5–5.1)
Sodium: 142 mmol/L (ref 135–145)
Total Bilirubin: 0.2 mg/dL — ABNORMAL LOW (ref 0.3–1.2)
Total Protein: 7 g/dL (ref 6.5–8.1)

## 2019-09-03 LAB — CBC WITH DIFFERENTIAL (CANCER CENTER ONLY)
Abs Immature Granulocytes: 0.01 10*3/uL (ref 0.00–0.07)
Basophils Absolute: 0.1 10*3/uL (ref 0.0–0.1)
Basophils Relative: 1 %
Eosinophils Absolute: 0.7 10*3/uL — ABNORMAL HIGH (ref 0.0–0.5)
Eosinophils Relative: 12 %
HCT: 38.3 % (ref 36.0–46.0)
Hemoglobin: 12.6 g/dL (ref 12.0–15.0)
Immature Granulocytes: 0 %
Lymphocytes Relative: 26 %
Lymphs Abs: 1.6 10*3/uL (ref 0.7–4.0)
MCH: 30.6 pg (ref 26.0–34.0)
MCHC: 32.9 g/dL (ref 30.0–36.0)
MCV: 93 fL (ref 80.0–100.0)
Monocytes Absolute: 0.7 10*3/uL (ref 0.1–1.0)
Monocytes Relative: 11 %
Neutro Abs: 3 10*3/uL (ref 1.7–7.7)
Neutrophils Relative %: 50 %
Platelet Count: 142 10*3/uL — ABNORMAL LOW (ref 150–400)
RBC: 4.12 MIL/uL (ref 3.87–5.11)
RDW: 13.3 % (ref 11.5–15.5)
WBC Count: 6.1 10*3/uL (ref 4.0–10.5)
nRBC: 0 % (ref 0.0–0.2)

## 2019-11-20 ENCOUNTER — Other Ambulatory Visit: Payer: Self-pay | Admitting: Oncology

## 2019-12-25 ENCOUNTER — Ambulatory Visit
Admission: RE | Admit: 2019-12-25 | Discharge: 2019-12-25 | Disposition: A | Discharge status: Home / Self Care | Payer: Medicare Other | Source: Ambulatory Visit | Attending: Oncology | Admitting: Oncology

## 2019-12-25 ENCOUNTER — Other Ambulatory Visit: Payer: Self-pay

## 2019-12-25 DIAGNOSIS — C50511 Malignant neoplasm of lower-outer quadrant of right female breast: Secondary | ICD-10-CM

## 2019-12-25 DIAGNOSIS — C50412 Malignant neoplasm of upper-outer quadrant of left female breast: Secondary | ICD-10-CM

## 2020-01-22 ENCOUNTER — Encounter: Payer: Self-pay | Admitting: Gastroenterology

## 2020-02-11 ENCOUNTER — Telehealth: Payer: Self-pay | Admitting: *Deleted

## 2020-02-11 NOTE — Telephone Encounter (Signed)
This RN returned VM left by the patient stating concern per her primary MD of noted drop in platelets in December 2021 with 114,000 and then rechecked 2/4 with reading of 103,000.  Noted pt's plate range from 638,453 to 151,000 per labs at this office.  Pt denies any new medications or supplements.  She recently had her yearly allergen shot.  This RN informed her above would be given to MD for review and call returned to her.

## 2020-02-11 NOTE — Telephone Encounter (Signed)
This RN returned VM left by the patient stating concern per her primary MD of noted drop in platelets in December 2021 with 114,000 and then rechecked 2/4 with reading of 103,000.  Noted pt's plate range from 124,000 to 151,000 per labs at this office.  Pt denies any new medications or supplements.  She recently had her yearly allergen shot.  This RN informed her above would be given to MD for review and call returned to her. 

## 2020-02-13 ENCOUNTER — Other Ambulatory Visit: Payer: Self-pay | Admitting: Oncology

## 2020-02-13 ENCOUNTER — Telehealth: Payer: Self-pay

## 2020-02-13 DIAGNOSIS — D696 Thrombocytopenia, unspecified: Secondary | ICD-10-CM

## 2020-02-13 NOTE — Telephone Encounter (Signed)
Patient scheduled for labs per MD recommendations for 2/16 - Pt aware.

## 2020-02-20 ENCOUNTER — Inpatient Hospital Stay: Payer: Medicare Other | Attending: Oncology

## 2020-02-20 ENCOUNTER — Other Ambulatory Visit: Payer: Self-pay

## 2020-02-20 DIAGNOSIS — C50511 Malignant neoplasm of lower-outer quadrant of right female breast: Secondary | ICD-10-CM | POA: Insufficient documentation

## 2020-02-20 DIAGNOSIS — Z170 Estrogen receptor positive status [ER+]: Secondary | ICD-10-CM | POA: Insufficient documentation

## 2020-02-20 DIAGNOSIS — D696 Thrombocytopenia, unspecified: Secondary | ICD-10-CM

## 2020-02-20 DIAGNOSIS — C50412 Malignant neoplasm of upper-outer quadrant of left female breast: Secondary | ICD-10-CM | POA: Diagnosis not present

## 2020-02-20 LAB — CBC WITH DIFFERENTIAL/PLATELET
Abs Immature Granulocytes: 0.02 10*3/uL (ref 0.00–0.07)
Basophils Absolute: 0 10*3/uL (ref 0.0–0.1)
Basophils Relative: 1 %
Eosinophils Absolute: 0.2 10*3/uL (ref 0.0–0.5)
Eosinophils Relative: 3 %
HCT: 39 % (ref 36.0–46.0)
Hemoglobin: 12.7 g/dL (ref 12.0–15.0)
Immature Granulocytes: 0 %
Lymphocytes Relative: 20 %
Lymphs Abs: 1.2 10*3/uL (ref 0.7–4.0)
MCH: 30.2 pg (ref 26.0–34.0)
MCHC: 32.6 g/dL (ref 30.0–36.0)
MCV: 92.6 fL (ref 80.0–100.0)
Monocytes Absolute: 0.6 10*3/uL (ref 0.1–1.0)
Monocytes Relative: 10 %
Neutro Abs: 3.8 10*3/uL (ref 1.7–7.7)
Neutrophils Relative %: 66 %
Platelets: 153 10*3/uL (ref 150–400)
RBC: 4.21 MIL/uL (ref 3.87–5.11)
RDW: 13.2 % (ref 11.5–15.5)
WBC: 5.7 10*3/uL (ref 4.0–10.5)
nRBC: 0 % (ref 0.0–0.2)

## 2020-02-20 LAB — PLATELET BY CITRATE

## 2020-02-20 LAB — SAVE SMEAR(SSMR), FOR PROVIDER SLIDE REVIEW

## 2020-02-21 ENCOUNTER — Other Ambulatory Visit: Payer: Self-pay

## 2020-02-21 ENCOUNTER — Ambulatory Visit (AMBULATORY_SURGERY_CENTER): Payer: Medicare Other | Admitting: *Deleted

## 2020-02-21 VITALS — Ht 63.0 in | Wt 167.0 lb

## 2020-02-21 DIAGNOSIS — Z86010 Personal history of colonic polyps: Secondary | ICD-10-CM

## 2020-02-21 MED ORDER — PLENVU 140 G PO SOLR: 1.0000 | Freq: Once | ORAL | 0 refills | Status: AC

## 2020-02-21 NOTE — Progress Notes (Signed)
No egg or soy allergy known to patient  No issues with past sedation with any surgeries or procedures No intubation problems in the past  No FH of Malignant Hyperthermia No diet pills per patient No home 02 use per patient  No blood thinners per patient  Pt denies issues with constipation  No A fib or A flutter  EMMI video to pt or via [place] 19 guidelines implemented in PV today with Pt and RN  Pt is fully vaccinated  for Covid  Pt denies loose or missing teeth, denies dentures, partials, dental implants, capped or bonded teeth  Patient verified that despite all her meat allergies, she has been tested for eggs and soy and is not allergic.  Virtual pre visit completed.   Instructions mailed and sent through [facility] [place].   Discussed with pt there will be an out-of-pocket cost for prep and that varies from $0 to 70 dollars   Due to the COVID-19 pandemic we are asking patients to follow certain guidelines.  Pt aware of COVID protocols and [facility] guidelines

## 2020-03-06 ENCOUNTER — Encounter: Payer: Self-pay | Admitting: Gastroenterology

## 2020-03-06 ENCOUNTER — Other Ambulatory Visit: Payer: Self-pay

## 2020-03-06 ENCOUNTER — Ambulatory Visit (AMBULATORY_SURGERY_CENTER): Payer: Medicare Other | Admitting: Gastroenterology

## 2020-03-06 VITALS — BP 134/62 | HR 69 | Temp 96.2°F | Resp 15 | Ht 63.0 in | Wt 167.0 lb

## 2020-03-06 DIAGNOSIS — Z86010 Personal history of colonic polyps: Secondary | ICD-10-CM | POA: Diagnosis not present

## 2020-03-06 MED ORDER — SODIUM CHLORIDE 0.9 % IV SOLN: 500.0000 mL | Freq: Once | INTRAVENOUS | Status: DC

## 2020-03-06 NOTE — Progress Notes (Signed)
C Powers ,CRNA notified of soy & egg allergy on record

## 2020-03-06 NOTE — Patient Instructions (Signed)
HANDOUT given for Diverticulosis.  YOU HAD AN ENDOSCOPIC PROCEDURE TODAY AT [facility]:   Refer to the procedure report that was given to you for any specific questions about what was found during the examination.  If the procedure report does not answer your questions, please call your gastroenterologist to clarify.  If you requested that your care partner not be given the details of your procedure findings, then the procedure report has been included in a sealed envelope for you to review at your convenience later.  YOU SHOULD EXPECT: Some feelings of bloating in the abdomen. Passage of more gas than usual.  Walking can help get rid of the air that was put into your GI tract during the procedure and reduce the bloating. If you had a lower endoscopy (such as a colonoscopy or flexible sigmoidoscopy) you may notice spotting of blood in your stool or on the toilet paper. If you underwent a bowel prep for your procedure, you may not have a normal bowel movement for a few days.  Please Note:  You might notice some irritation and congestion in your nose or some drainage.  This is from the oxygen used during your procedure.  There is no need for concern and it should clear up in a day or so.  SYMPTOMS TO REPORT IMMEDIATELY:   Following lower endoscopy (colonoscopy or flexible sigmoidoscopy):  Excessive amounts of blood in the stool  Significant tenderness or worsening of abdominal pains  Swelling of the abdomen that is new, acute  Fever of 100F or higher  For urgent or emergent issues, a gastroenterologist can be reached at any hour by calling [phone]. Do not use MyChart messaging for urgent concerns.    DIET:  We do recommend a small meal at first, but then you may proceed to your regular diet.  Drink plenty of fluids but you should avoid alcoholic beverages for 24 hours.  ACTIVITY:  You should plan to take it easy for the rest of today and you should NOT DRIVE or use  heavy machinery until tomorrow (because of the sedation medicines used during the test).    FOLLOW UP: Our staff will call the number listed on your records 48-72 hours following your procedure to check on you and address any questions or concerns that you may have regarding the information given to you following your procedure. If we do not reach you, we will leave a message.  We will attempt to reach you two times.  During this call, we will ask if you have developed any symptoms of COVID 19. If you develop any symptoms (ie: fever, flu-like symptoms, shortness of breath, cough etc.) before then, please call [phone].  If you test positive for Covid 19 in the 2 weeks post procedure, please call and report this information to [place].    If any biopsies were taken you will be contacted by phone or by letter within the next 1-3 weeks.  Please call us at [phone] if you have not heard about the biopsies in 3 weeks.    SIGNATURES/CONFIDENTIALITY: You and/or your care partner have signed paperwork which will be entered into your electronic medical record.  These signatures attest to the fact that that the information above on your After Visit Summary has been reviewed and is understood.  Full responsibility of the confidentiality of this discharge information lies with you and/or your care-partner.

## 2020-03-06 NOTE — Op Note (Signed)
[facility] Patient Name: Isabella Cook Procedure Date: 03/06/2020 4:04 PM MRN: [id number] Endoscopist: Remo Lipps P. Havery Moros , MD Age: 80 Referring MD:  Date of Birth: Oct 13, 1940 Gender: Female Account #: [id number] Procedure:                Colonoscopy Indications:              High risk colon cancer surveillance: Personal                            history of colonic polyps (multiple adenomas                            removed in 2019) Medicines:                Monitored Anesthesia Care Procedure:                Pre-Anesthesia Assessment:                           - Prior to the procedure, a History and Physical                            was performed, and patient medications and                            allergies were reviewed. The patient's tolerance of                            previous anesthesia was also reviewed. The risks                            and benefits of the procedure and the sedation                            options and risks were discussed with the patient.                            All questions were answered, and informed consent                            was obtained. Prior Anticoagulants: The patient has                            taken no previous anticoagulant or antiplatelet                            agents. ASA Grade Assessment: II - A patient with                            mild systemic disease. After reviewing the risks                            and benefits, the patient was deemed in  satisfactory condition to undergo the procedure.                           After obtaining informed consent, the colonoscope                            was passed under direct vision. Throughout the                            procedure, the patient's blood pressure, pulse, and                            oxygen saturations were monitored continuously. The                            Olympus PFC-[id number] (#[id number]) Colonoscope was                             introduced through the anus and advanced to the the                            terminal ileum, with identification of the                            appendiceal orifice and IC valve. The colonoscopy                            was performed without difficulty. The patient                            tolerated the procedure well. The quality of the                            bowel preparation was good. The terminal ileum,                            ileocecal valve, appendiceal orifice, and rectum                            were photographed. Scope In: 4:05:22 PM Scope Out: 4:29:44 PM Scope Withdrawal Time: 0 hours 20 minutes 32 seconds  Total Procedure Duration: 0 hours 24 minutes 22 seconds  Findings:                 The perianal and digital rectal examinations were                            normal.                           The terminal ileum appeared normal.                           Scattered medium-mouthed diverticula were found in  the sigmoid colon and ascending colon.                           The colon was tortuous.                           Internal hemorrhoids were found during                            retroflexion. The hemorrhoids were small.                           The exam was otherwise without abnormality. Complications:            No immediate complications. Estimated blood loss:                            None. Estimated Blood Loss:     Estimated blood loss: none. Impression:               - The examined portion of the ileum was normal.                           - Diverticulosis in the sigmoid colon and in the                            ascending colon.                           - Tortuous colon.                           - Internal hemorrhoids.                           - The examination was otherwise normal.                           - No polyps Recommendation:           - Patient has a contact number available for                             emergencies. The signs and symptoms of potential                            delayed complications were discussed with the                            patient. Return to normal activities tomorrow.                            Written discharge instructions were provided to the                            patient.                           -  Resume previous diet.                           - Continue present medications.                           - No further surveillance colonoscopy is                            recommended due to age and results of this exam Carlota Raspberry. Eirik Schueler, MD 03/06/2020 4:33:45 PM This report has been signed electronically.

## 2020-03-06 NOTE — Progress Notes (Signed)
Vs in adm by Red Christians RN  Pt's states no medical or surgical changes since previsit or office visit.( virtual)

## 2020-03-06 NOTE — Progress Notes (Signed)
A and O x3. Report to RN. Tolerated MAC anesthesia well.

## 2020-03-10 ENCOUNTER — Telehealth: Payer: Self-pay

## 2020-03-10 NOTE — Telephone Encounter (Signed)
  Follow up Call-  Call back number 03/06/2020 03/06/2020  Post procedure Call Back phone  # [phone]  Permission to leave phone message Yes Yes  Some recent data might be hidden     Patient questions:  Do you have a fever, pain , or abdominal swelling? No. Pain Score  0 *  Have you tolerated food without any problems? Yes.    Have you been able to return to your normal activities? Yes.    Do you have any questions about your discharge instructions: Diet   No. Medications  No. Follow up visit  No.  Do you have questions or concerns about your Care? No.  Actions: * If pain score is 4 or above: No action needed, pain <4.   1. Have you developed a fever since your procedure? No   2.   Have you had an respiratory symptoms (SOB or cough) since your procedure? No   3.   Have you tested positive for COVID 19 since your procedure? No   4.   Have you had any family members/close contacts diagnosed with the COVID 19 since your procedure?  No    If yes to any of these questions please route to Joylene John, RN and Joella Prince, RN

## 2020-06-20 ENCOUNTER — Other Ambulatory Visit: Payer: Self-pay | Admitting: Oncology

## 2020-06-20 DIAGNOSIS — Z1231 Encounter for screening mammogram for malignant neoplasm of breast: Secondary | ICD-10-CM

## 2020-07-10 ENCOUNTER — Telehealth: Payer: Self-pay | Admitting: Oncology

## 2020-07-10 NOTE — Telephone Encounter (Signed)
Rescheduled appointment per provider. Patient is aware. 

## 2020-07-11 ENCOUNTER — Telehealth: Payer: Self-pay | Admitting: Oncology

## 2020-07-11 NOTE — Telephone Encounter (Signed)
Scheduled appointment per provider. Patient is aware. 

## 2020-07-30 ENCOUNTER — Other Ambulatory Visit: Payer: Self-pay | Admitting: Oncology

## 2020-07-30 DIAGNOSIS — Z853 Personal history of malignant neoplasm of breast: Secondary | ICD-10-CM

## 2020-08-04 ENCOUNTER — Ambulatory Visit
Admission: RE | Admit: 2020-08-04 | Discharge: 2020-08-04 | Disposition: A | Discharge status: Home / Self Care | Payer: Medicare Other | Source: Ambulatory Visit | Attending: Oncology | Admitting: Oncology

## 2020-08-04 ENCOUNTER — Other Ambulatory Visit: Payer: Self-pay

## 2020-08-04 DIAGNOSIS — Z853 Personal history of malignant neoplasm of breast: Secondary | ICD-10-CM

## 2020-09-02 ENCOUNTER — Other Ambulatory Visit: Payer: Medicare Other

## 2020-09-02 ENCOUNTER — Ambulatory Visit: Payer: Medicare Other | Admitting: Oncology

## 2020-09-10 NOTE — Progress Notes (Signed)
[place]  Telephone:([phone]) [phone] Fax:(336) [phone]   ID: Isabella Cook DOB: Sep 22, 1940  MR#: [id number]  BEM#:[id number]  Patient Care Team: [name], FNP as PCP - General (Family Medicine) Nicha Hemann, Virgie Dad, MD as Consulting Physician (Oncology) [name], MD as Consulting Physician (General Surgery) [name], MD as Consulting Physician (Neurosurgery) [name], MD as Referring Physician (Otolaryngology) Bensimhon, Shaune Pascal, MD as Consulting Physician (Cardiology) Delice Bison, Charlestine Massed, NP as Nurse Practitioner (Hematology and Oncology) Armbruster, Carlota Raspberry, MD as Consulting Physician (Gastroenterology) OTHER MD:  CHIEF COMPLAINT: HER-2 positive breast cancer, bilateral beast cancers  CURRENT TREATMENT: Tamoxifen   INTERVAL HISTORY: Isabella Cook returns today for follow-up of her estrogen receptor positive breast cancer.  She is accompanied by her husband.  Isabella Cook continues on tamoxifen.  She is tolerating this currently with no side effects that she is aware of.  Since her last visit, she underwent bilateral diagnostic mammography with tomography at [place] on 08/04/2020 showing: breast density category C; no evidence of malignancy in either breast.  She also underwent bone density screening on 12/25/2019 showing a T-score of -1.3, which is considered osteopenic.  Of note, she also underwent colonoscopy on 03/06/2020 under Dr. Havery Moros showing: diverticulosis; tortuous colon; internal hemorrhoids; no polyps. Repeat not recommended due to age.   REVIEW OF SYSTEMS: Isabella Cook tells me her alpha gal allergy cleared with acupuncture.  She continues to be sedentary, enjoying reading but not exercising very much.  She is very comfortable with her life at present and really has no significant complaints.  A detailed review of systems was benign.   COVID 19 VACCINATION STATUS: Pfizer x3   BREAST CANCER HISTORY:   From the original intake  note:  Isabella Cook had routine screening mammography at [facility] suggesting an area of indeterminate microcalcifications in the upper outer quadrant of the left breast measuring 8 mm. She was referred for left breast stereotactic biopsy performed at the [facility] 02/27/2015. This showed ([id number]) invasive ductal carcinoma, grade 2, estrogen receptor 100% positive, progesterone receptor 100% positive, both with strong staining intensity, with an MIB-1 of 20%, and HER-2 amplified with a signals ratio of 2.53, the number per cell being 2.65. 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  On 03/25/2015 the patient underwent bilateral breast MRI, which showed the breast density to be category B. this showed, in the left breast, an area of non-masslike enhancement measuring 7.8 cm. Associated with this were 2 adjacent irregular masses measuring 1.7 and 1.4 cm. The masses were 5 cm anterior and superior to the biopsy clip. There was a cortically thickened left axillary lymph node noted.  Also in the right breast there was a 7 mm irregular enhancing mass at the 6:30 o'clock position.  Her subsequent history is as detailed below    PAST MEDICAL HISTORY: Past Medical History:  Diagnosis Date   Arthritis    wrists and knees   Cancer ([place]) 03-2015   left breast   GERD (gastroesophageal reflux disease)    History of hiatal hernia    History of jaundice as a child    History of radiation therapy completed 11/17/15   50 Gy to Right Breast, 60 Gy to Left Breast with regional nodal treatment on Left as well.    Hypothyroidism    Multiple food allergies    Osteopenia    Personal history of chemotherapy    Personal history of radiation therapy    Urinary tract infection  PAST SURGICAL HISTORY: Past Surgical History:  Procedure Laterality Date   ABDOMINAL HYSTERECTOMY     APPENDECTOMY     BACK SURGERY     lumbar   BREAST LUMPECTOMY Bilateral    Oct. 2017   BREAST LUMPECTOMY WITH  RADIOACTIVE SEED AND SENTINEL LYMPH NODE BIOPSY Bilateral 08/07/2015   Procedure: LEFT BREAST LUMPECTOMY LOCALIZED WITH 2 RADIOACTIVE SEEDS, RIGHT BREAST SEED GUIDED LUMPECTOMY, BILATERAL SENTINEL LYMPH NODE MAPPING;  Surgeon: [name], MD;  Location: [place];  Service: General;  Laterality: Bilateral;   BREAST RECONSTRUCTION Bilateral 09/02/2015   Procedure: BILATERAL ONCOPLASTIC RECONSTRUCTION WITH MASTOPEXY;  Surgeon: [name], MD;  Location: [place];  Service: Plastics;  Laterality: Bilateral;   CATARACT EXTRACTION     CERVICAL [place] SURGERY  2016   CHOLECYSTECTOMY     COLONOSCOPY  2014   HERNIA REPAIR     UHR   MASTOPEXY Bilateral 09/02/2015   Procedure: MASTOPEXY;  Surgeon: [name], MD;  Location: [place];  Service: Plastics;  Laterality: Bilateral;   PORT-A-CATH REMOVAL Right 07/19/2016   Procedure: REMOVAL PORT-A-CATH;  Surgeon: [name], MD;  Location: [place];  Service: General;  Laterality: Right;  ANESTHESIA - MAC/LOCAL   PORTACATH PLACEMENT Right 04/22/2015   Procedure: INSERTION PORT-A-CATH WITH [place];  Surgeon: [name], MD;  Location: [place];  Service: General;  Laterality: Right;   RE-EXCISION OF BREAST LUMPECTOMY Left 09/02/2015   Procedure: RE-EXCISION OF LEFT BREAST LUMPECTOMY;  Surgeon: [name], MD;  Location: [place];  Service: General;  Laterality: Left;   REDUCTION MAMMAPLASTY Bilateral    TUBAL LIGATION      FAMILY HISTORY Family History  Problem Relation Age of Onset   Diabetes Mother    Diabetes Sister    Colon cancer Neg Hx    Breast cancer Neg Hx    Esophageal cancer Neg Hx    Rectal cancer Neg Hx    Stomach cancer Neg Hx   The patient's father had a history of pseudo-bulbar pulse 63 and died from a stroke at age 78. The patient's mother died at age 35 from complications of emphysema. The patient had no brothers, 2 sisters. There is no  history of cancer in the family and specifically no history of breast or ovarian cancer.   GYNECOLOGIC HISTORY:  No LMP recorded. Patient has had a hysterectomy. Menarche age 82, first live birth age 28. She is GX P2. She underwent a hysterectomy with left salpingo-oophorectomy in 1984. She received hormone replacement for approximately 15 years, until 2005.   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 Her husband [name] is a retired Social research officer, government. Daughter Isabella Cook currently lives with the patient and her 2 children, in their 8s, live across the street from [place].  Son Isabella Cook lives in [place] and works in Chartered certified accountant.  Isabella Cook is not a church attender    ADVANCED DIRECTIVES: In place   HEALTH MAINTENANCE: Social History   Tobacco Use   Smoking status: Never   Smokeless tobacco: Never  Vaping Use   Vaping Use: Never used  Substance Use Topics   Alcohol use: Yes    Comment: occ 1 every 2-3 months   Drug use: No     Colonoscopy:2014  PAP:  Bone density: 2017 [facility]//osteopenia   Lipid panel:  Allergies  Allergen Reactions  Beef (Bovine) Protein Anaphylaxis    Muscle pain,  Takes claritin every day and benadryl, if needed, to prevent anaphylaxis  Muscle pain,  Takes claritin every day and benadryl, if needed, to prevent anaphylaxis   Eggs Or Egg-Derived Products Anaphylaxis    Nasal stuffiness, Takes claritin every day and benadryl, if needed, to prevent anaphylaxis Nasal stuffiness, Takes claritin every day and benadryl, if needed, to prevent anaphylaxis   Gelatin Anaphylaxis    Muscle pain,  Takes claritin every day and benadryl, if needed, to prevent anaphylaxis   Gluten Meal Anaphylaxis and Diarrhea    Takes claritin every day and benadryl, if needed, to prevent anaphylaxis  Takes claritin every day and benadryl, if needed, to prevent  anaphylaxis   Lactalbumin Anaphylaxis    Nasal stuffiness Cheese (mozzarella, swiss, cheddar, cottage)   Lactase Anaphylaxis     Takes claritin every day and benadryl, if needed, to prevent anaphylaxis   Takes claritin every day and benadryl, if needed, to prevent anaphylaxis   Lambs Quarters Anaphylaxis    ALL "mammal" MEAT per pt -- Muscle pain  Can eat chicken, fish, [place]   Milk-Related Compounds Anaphylaxis and Other (See Comments)    Nasal stuffiness Cheese (mozzarella, swiss, cheddar, cottage) Nasal stuffiness   Pork Allergy Anaphylaxis    ALL "mammal" MEAT per pt -- Muscle pain  Can eat chicken, fish, [place] ALL "mammal" MEAT per pt -- Muscle pain  Can eat chicken, fish, [place]   Pork-Derived Products Anaphylaxis    ALL "mammal" MEAT per pt -- Muscle pain  Can eat chicken, fish, [place]   [employer] Meal Anaphylaxis    Nasal stuffiness, Takes claritin every day and benadryl, if needed, to prevent anaphylaxis Nasal stuffiness, Takes claritin every day and benadryl, if needed, to prevent anaphylaxis Nasal stuffiness, Takes claritin every day and benadryl, if needed, to prevent anaphylaxis    Sulfa Antibiotics Anaphylaxis    ALL "mammal" MEAT per pt -- Muscle pain  Can eat chicken, fish, [place]   Wheat Bran Anaphylaxis and Diarrhea     Takes claritin every day and benadryl, if needed, to prevent anaphylaxis   Takes claritin every day and benadryl, if needed, to prevent anaphylaxis   Whey Anaphylaxis and Diarrhea     Takes claritin every day and benadryl, if needed, to prevent anaphylaxis  Nasal stuffiness Cheese (mozzarella, swiss, cheddar, cottage)  Takes claritin every day and benadryl, if needed, to prevent anaphylaxis   Yeast Anaphylaxis     Takes claritin every day and benadryl, if needed, to prevent anaphylaxis   [employer] Pollen Other (See Comments)   Soy Allergy Diarrhea    Other reaction(s): OTHER   Almond Oil Nausea And Vomiting   Other     Multiple food  allergies   Peanut Oil     Current Outpatient Medications  Medication Sig Dispense Refill   Biotin 5000 MCG CAPS Take 5,000 mcg by mouth. (Patient not taking: No sig reported)     calcium carbonate (TUMS - DOSED IN MG ELEMENTAL CALCIUM) 500 MG chewable tablet Chew 500 mg by mouth daily as needed for indigestion or heartburn.      clobetasol (TEMOVATE) 0.05 % external solution SMARTSIG:Sparingly Topical Daily     Cyanocobalamin (VITAMIN B-12 PO) Take 5 mg by mouth daily. (Patient not taking: No sig reported)     diphenhydrAMINE (SOMINEX) 25 MG tablet Take 25 mg by mouth at bedtime as needed. Reported on 05/20/2015     estradiol (ESTRING) 2  MG vaginal ring Place 2 mg vaginally every 3 (three) months. follow package directions     famotidine (PEPCID) 20 MG tablet Take 20 mg by mouth daily.      Liothyronine Sodium POWD 5 mcg by Does not apply route.     loratadine (CLARITIN) 10 MG tablet Take 10 mg by mouth daily.     Magnesium Citrate 100 MG TABS Take 400 mg by mouth daily.      melatonin 3 MG TABS tablet Take 3 mg by mouth at bedtime as needed and may repeat dose one time if needed.     Methylcellulose, Laxative, 500 MG TABS Take 2,000 mg by mouth 2 (two) times daily.     naproxen sodium (ANAPROX) 220 MG tablet Take 440 mg by mouth 3 (three) times daily with meals as needed.      Probiotic Product (ALIGN PO) Take 1 tablet by mouth daily.      QUERCETIN PO Take 1 tablet by mouth once a week. (Patient not taking: No sig reported)     tamoxifen (NOLVADEX) 20 MG tablet Take 1 tablet (20 mg total) by mouth daily. 90 tablet 5   VITAMIN A PO Take 5,000 Units by mouth daily. (Patient not taking: No sig reported)     VITAMIN D-VITAMIN K PO Take 1 capsule by mouth daily. Reported on 04/15/2015     No current facility-administered medications for this visit.    OBJECTIVE: White Cook in no acute distress  Vitals:   09/11/20 1017  BP: (!) 144/75  Pulse: 78  Resp: 16  Temp: (!) 97 F (36.1 C)   SpO2: 95%     Body mass index is 30.11 kg/m.    ECOG FS:1 - Symptomatic but completely ambulatory   Sclerae unicteric, EOMs intact Wearing a mask No cervical or supraclavicular adenopathy Lungs no rales or rhonchi Heart regular rate and rhythm Abd soft, nontender, positive bowel sounds MSK no focal spinal tenderness, no upper extremity lymphedema Neuro: nonfocal, well oriented, appropriate affect Breasts: Status post bilateral lumpectomies, with bilateral reduction mammoplasty's and bilateral radiation.  There is no evidence of local recurrence.  Both axillae are benign.   LAB RESULTS:  CMP     Component Value Date/Time   NA 142 09/03/2019 0948   NA 141 09/14/2016 1340   K 4.2 09/03/2019 0948   K 4.2 09/14/2016 1340   CL 106 09/03/2019 0948   CO2 27 09/03/2019 0948   CO2 26 09/14/2016 1340   GLUCOSE 111 (H) 09/03/2019 0948   GLUCOSE 113 09/14/2016 1340   BUN 12 09/03/2019 0948   BUN 11.7 09/14/2016 1340   CREATININE 1.06 (H) 09/03/2019 0948   CREATININE 1.0 09/14/2016 1340   CALCIUM 10.4 (H) 09/03/2019 0948   CALCIUM 9.6 09/14/2016 1340   PROT 7.0 09/03/2019 0948   PROT 6.5 09/14/2016 1340   ALBUMIN 3.6 09/03/2019 0948   ALBUMIN 3.5 09/14/2016 1340   AST 17 09/03/2019 0948   AST 15 09/14/2016 1340   ALT 12 09/03/2019 0948   ALT 15 09/14/2016 1340   ALKPHOS 37 (L) 09/03/2019 0948   ALKPHOS 36 (L) 09/14/2016 1340   BILITOT 0.2 (L) 09/03/2019 0948   BILITOT <0.22 09/14/2016 1340   GFRNONAA 50 (L) 09/03/2019 0948   GFRAA 58 (L) 09/03/2019 0948    INo results found for: SPEP, UPEP  Lab Results  Component Value Date   WBC 5.2 09/11/2020   NEUTROABS 3.4 09/11/2020   HGB 12.6 09/11/2020   HCT  37.3 09/11/2020   MCV 92.1 09/11/2020   PLT 157 09/11/2020      Chemistry      Component Value Date/Time   NA 142 09/03/2019 0948   NA 141 09/14/2016 1340   K 4.2 09/03/2019 0948   K 4.2 09/14/2016 1340   CL 106 09/03/2019 0948   CO2 27 09/03/2019 0948   CO2 26  09/14/2016 1340   BUN 12 09/03/2019 0948   BUN 11.7 09/14/2016 1340   CREATININE 1.06 (H) 09/03/2019 0948   CREATININE 1.0 09/14/2016 1340      Component Value Date/Time   CALCIUM 10.4 (H) 09/03/2019 0948   CALCIUM 9.6 09/14/2016 1340   ALKPHOS 37 (L) 09/03/2019 0948   ALKPHOS 36 (L) 09/14/2016 1340   AST 17 09/03/2019 0948   AST 15 09/14/2016 1340   ALT 12 09/03/2019 0948   ALT 15 09/14/2016 1340   BILITOT 0.2 (L) 09/03/2019 0948   BILITOT <0.22 09/14/2016 1340       No results found for: LABCA2  No components found for: [id number]  No results for input(s): INR in the last 168 hours.  Urinalysis    Component Value Date/Time   LABSPEC 1.005 12/12/2015 1410   PHURINE 6.0 12/12/2015 1410   GLUCOSEU Negative 12/12/2015 1410   HGBUR Negative 12/12/2015 1410   BILIRUBINUR Negative 12/12/2015 1410   KETONESUR Negative 12/12/2015 1410   PROTEINUR Negative 12/12/2015 1410   UROBILINOGEN 0.2 12/12/2015 1410   NITRITE Negative 12/12/2015 1410   LEUKOCYTESUR Small 12/12/2015 1410    ELIGIBLE FOR AVAILABLE RESEARCH PROTOCOL: not a PALLAS trial candidate as HER-2 positive   STUDIES: No results found.   ASSESSMENT: 80 y.o. Isabella Cook  status post left breast upper outer quadrant biopsy 02/27/2015 for a clinical mT1-3 N1 invasive ductal carcinoma, grade 2, strongly estrogen and progesterone receptor positive, HER-2 amplified, with an MIB-1 of 20%.   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  (b) right breast lower outer quadrant: a clinical T1b N0, stage IA invasive ductal carcinoma, estrogen and  progesterone receptor positive, HER-2 not amplified, with an Mib-1 of 4%  (2)  neoadjuvant chemotherapy consisting of weekly paclitaxel 12 together with weekly trastuzumab started 04/29/2015  (a) 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  (4) on 08/07/2015 she underwent  (a) right lumpectomy and sentinel lymph node sampling showing ypT0 ypN0 (complete pathologic response)  (b) two left lumpectomies, for ympT2 ypN1 invasive ductal carcinoma, grade 1, with positive margins.  (5) additional left breast surgery 09/02/2015 cleared the margins (the closest being less than 0.1 cm left superior for DCIS)  (6) adjuvant radiation 10/07/2015 to 11/17/2015  1. The Right breast was treated to 50 Gy in 25 fractions at 2 Gy per fraction. 2. The Left breast (4-field) was treated to 50 Gy in 25 fractions at 2 Gy per  fraction. 3. The Left breast was boosted to 10 Gy in 5 fractions at 2 Gy per fraction.   (7). Tamoxifen started 12/23/2015 --  (a) on Estring  under cover of tamoxifen  (8) incidentally noted left lower lobe 0.7 cm nodule on 07/10/2015 MRI , requiring eventual follow-up  (a) repeat CT scan of the chest 12/12/2015 showed the nodule unchanged (0.6 cm)  (b) repeat chest CT scan 08/29/2017 entirely stable: No further evaluation needed   PLAN: Yerania is now just over 5 years out from definitive surgery for her breast cancer with no evidence of disease recurrence.  This is very favorable.  She has completed 5 years of tamoxifen and she understands the main benefit of the drug is occluded in the first 5 years.  We discussed continuing tamoxifen and additional 5years, switching  to anastrozole for 2years, or simply discontinuing follow-up and adjuvant treatment.  She is very much in favor of continuing tamoxifen.  This helps her bones, allows her to safely use Estrace vaginal cream, and she is obtaining it at a very good price without side effects.  Of course it will also further reduce her risk of breast cancer recurrence although only by few percentage points  The plan then is to continue tamoxifen a total of 10 years.  She will see [place] again in a year, after her mammography then.  She knows to call for any other issue that may develop before that visit  Total encounter time 25 minutes.*  Hagan Vanauken, Virgie Dad, MD  09/11/20 5:21 PM Medical Oncology and Hematology [facility] [place], [place] [address] Tel. [phone]    Fax. [phone]   I, Wilburn Mylar, am acting as scribe for Dr. Virgie Dad. Merl Bommarito.  I, Lurline Del MD, have reviewed the above documentation for accuracy and completeness, and I agree with the above.   *Total Encounter Time as defined by the Centers for Medicare and Medicaid Services includes, in addition to the face-to-face time of a patient visit (documented in the note above) non-face-to-face time: obtaining and reviewing outside history, ordering and reviewing medications, tests or procedures, care coordination (communications with other health care professionals or caregivers) and documentation in the medical record.

## 2020-09-11 ENCOUNTER — Other Ambulatory Visit: Payer: Self-pay | Admitting: *Deleted

## 2020-09-11 ENCOUNTER — Inpatient Hospital Stay (HOSPITAL_BASED_OUTPATIENT_CLINIC_OR_DEPARTMENT_OTHER): Payer: Medicare Other | Admitting: Oncology

## 2020-09-11 ENCOUNTER — Other Ambulatory Visit: Payer: Self-pay

## 2020-09-11 ENCOUNTER — Inpatient Hospital Stay: Payer: Medicare Other | Attending: Oncology

## 2020-09-11 VITALS — BP 144/75 | HR 78 | Temp 97.0°F | Resp 16 | Ht 63.0 in | Wt 170.0 lb

## 2020-09-11 DIAGNOSIS — C50511 Malignant neoplasm of lower-outer quadrant of right female breast: Secondary | ICD-10-CM

## 2020-09-11 DIAGNOSIS — C50412 Malignant neoplasm of upper-outer quadrant of left female breast: Secondary | ICD-10-CM

## 2020-09-11 DIAGNOSIS — Z170 Estrogen receptor positive status [ER+]: Secondary | ICD-10-CM | POA: Insufficient documentation

## 2020-09-11 DIAGNOSIS — Z9221 Personal history of antineoplastic chemotherapy: Secondary | ICD-10-CM | POA: Insufficient documentation

## 2020-09-11 DIAGNOSIS — Z923 Personal history of irradiation: Secondary | ICD-10-CM | POA: Insufficient documentation

## 2020-09-11 DIAGNOSIS — C773 Secondary and unspecified malignant neoplasm of axilla and upper limb lymph nodes: Secondary | ICD-10-CM | POA: Insufficient documentation

## 2020-09-11 DIAGNOSIS — M8580 Other specified disorders of bone density and structure, unspecified site: Secondary | ICD-10-CM | POA: Insufficient documentation

## 2020-09-11 DIAGNOSIS — D696 Thrombocytopenia, unspecified: Secondary | ICD-10-CM

## 2020-09-11 LAB — CBC WITH DIFFERENTIAL/PLATELET
Abs Immature Granulocytes: 0.01 10*3/uL (ref 0.00–0.07)
Basophils Absolute: 0 10*3/uL (ref 0.0–0.1)
Basophils Relative: 0 %
Eosinophils Absolute: 0.2 10*3/uL (ref 0.0–0.5)
Eosinophils Relative: 3 %
HCT: 37.3 % (ref 36.0–46.0)
Hemoglobin: 12.6 g/dL (ref 12.0–15.0)
Immature Granulocytes: 0 %
Lymphocytes Relative: 20 %
Lymphs Abs: 1.1 10*3/uL (ref 0.7–4.0)
MCH: 31.1 pg (ref 26.0–34.0)
MCHC: 33.8 g/dL (ref 30.0–36.0)
MCV: 92.1 fL (ref 80.0–100.0)
Monocytes Absolute: 0.6 10*3/uL (ref 0.1–1.0)
Monocytes Relative: 11 %
Neutro Abs: 3.4 10*3/uL (ref 1.7–7.7)
Neutrophils Relative %: 66 %
Platelets: 157 10*3/uL (ref 150–400)
RBC: 4.05 MIL/uL (ref 3.87–5.11)
RDW: 13.6 % (ref 11.5–15.5)
WBC: 5.2 10*3/uL (ref 4.0–10.5)
nRBC: 0 % (ref 0.0–0.2)

## 2020-09-11 MED ORDER — TAMOXIFEN CITRATE 20 MG PO TABS: 20.0000 mg | ORAL_TABLET | Freq: Every day | ORAL | 5 refills | Status: DC

## 2021-06-22 ENCOUNTER — Other Ambulatory Visit: Payer: Self-pay | Admitting: Hematology and Oncology

## 2021-06-22 DIAGNOSIS — Z853 Personal history of malignant neoplasm of breast: Secondary | ICD-10-CM

## 2021-06-22 DIAGNOSIS — Z1231 Encounter for screening mammogram for malignant neoplasm of breast: Secondary | ICD-10-CM

## 2021-08-05 ENCOUNTER — Ambulatory Visit
Admission: RE | Admit: 2021-08-05 | Discharge: 2021-08-05 | Disposition: A | Discharge status: Home / Self Care | Payer: Medicare Other | Source: Ambulatory Visit | Attending: Hematology and Oncology | Admitting: Hematology and Oncology

## 2021-08-05 DIAGNOSIS — Z1231 Encounter for screening mammogram for malignant neoplasm of breast: Secondary | ICD-10-CM

## 2021-08-05 DIAGNOSIS — Z853 Personal history of malignant neoplasm of breast: Secondary | ICD-10-CM

## 2021-09-10 NOTE — Progress Notes (Signed)
Patient Care Team: [name], Green as PCP - General (Family Medicine) Magrinat, Virgie Dad, MD (Inactive) as Consulting Physician (Oncology) [name], MD as Consulting Physician (General Surgery) [name], MD as Consulting Physician (Neurosurgery) [name], MD as Referring Physician (Otolaryngology) Bensimhon, Shaune Pascal, MD as Consulting Physician (Cardiology) Delice Bison Charlestine Massed, NP as Nurse Practitioner (Hematology and Oncology) Armbruster, Carlota Raspberry, MD as Consulting Physician (Gastroenterology)  DIAGNOSIS: No diagnosis found.  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             (b) right breast lower outer quadrant: a clinical T1b N0, stage IA invasive ductal carcinoma, estrogen and progesterone receptor positive, HER-2 not amplified, with an Mib-1 of 4%   04/29/2015 - 07/15/2015 Neo-Adjuvant Chemotherapy   neoadjuvant chemotherapy consisting of weekly paclitaxel 12 together with weekly trastuzumab started 04/29/2015             (a) 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   09/02/2015 Surgery   Re excision cleared margins   10/07/2015 - 11/17/2015 Radiation Therapy   adjuvant radiation 10/07/2015 to 11/17/2015  1. The Right breast was treated to 50 Gy in 25 fractions at 2 Gy per fraction. 2. The Left breast (4-field) was treated to 50 Gy in 25 fractions at 2 Gy per fraction. 3. The Left breast was boosted to 10 Gy in 5 fractions at 2 Gy per fraction.    12/2015 -  Anti-estrogen oral therapy   Tamoxifen daily     CHIEF COMPLIANT: Follow-up breast cancer on Tamoxifen and establish oncology care with Dr, Lindi Adie  INTERVAL HISTORY: Isabella Cook is a 81 y.o the above mention. She presents to the clinic for a follow-up and establish oncology care with Dr. Lindi Adie.   ALLERGIES:  is allergic to beef (bovine) protein, eggs or egg-derived products, gelatin, gluten meal, lactalbumin, lambs quarters, milk-related compounds, pork allergy, pork-derived products, poultry meal, sulfa antibiotics, tilactase, wheat bran, whey, yeast, black walnut pollen, soy allergy, almond oil, other, and peanut oil.  MEDICATIONS:  Current Outpatient Medications  Medication Sig Dispense Refill   Biotin 5000 MCG CAPS Take 5,000 mcg by mouth. (Patient not taking: No sig reported)  calcium carbonate (TUMS - DOSED IN MG ELEMENTAL CALCIUM) 500 MG chewable tablet Chew 500 mg by mouth daily as needed for indigestion or heartburn.      clobetasol (TEMOVATE) 0.05 % external solution SMARTSIG:Sparingly Topical Daily     Cyanocobalamin (VITAMIN B-12 PO) Take 5 mg by mouth daily. (Patient not taking: No sig reported)     diphenhydrAMINE (SOMINEX) 25 MG tablet Take 25 mg by mouth at bedtime as needed. Reported on 05/20/2015     estradiol (ESTRING) 2 MG vaginal ring Place 2 mg  vaginally every 3 (three) months. follow package directions     famotidine (PEPCID) 20 MG tablet Take 20 mg by mouth daily.      Liothyronine Sodium POWD 5 mcg by Does not apply route.     loratadine (CLARITIN) 10 MG tablet Take 10 mg by mouth daily.     Magnesium Citrate 100 MG TABS Take 400 mg by mouth daily.      melatonin 3 MG TABS tablet Take 3 mg by mouth at bedtime as needed and may repeat dose one time if needed.     Methylcellulose, Laxative, 500 MG TABS Take 2,000 mg by mouth 2 (two) times daily.     naproxen sodium (ANAPROX) 220 MG tablet Take 440 mg by mouth 3 (three) times daily with meals as needed.      Probiotic Product (ALIGN PO) Take 1 tablet by mouth daily.      QUERCETIN PO Take 1 tablet by mouth once a week. (Patient not taking: No sig reported)     tamoxifen (NOLVADEX) 20 MG tablet Take 1 tablet (20 mg total) by mouth daily. 90 tablet 5   VITAMIN A PO Take 5,000 Units by mouth daily. (Patient not taking: No sig reported)     VITAMIN D-VITAMIN K PO Take 1 capsule by mouth daily. Reported on 04/15/2015     No current facility-administered medications for this visit.    PHYSICAL EXAMINATION: ECOG PERFORMANCE STATUS: {CHL ONC ECOG PS:[phone]}  There were no vitals filed for this visit. There were no vitals filed for this visit.  BREAST:*** No palpable masses or nodules in either right or left breasts. No palpable axillary supraclavicular or infraclavicular adenopathy no breast tenderness or nipple discharge. (exam performed in the presence of a chaperone)  LABORATORY DATA:  I have reviewed the data as listed    Latest Ref Rng & Units 09/03/2019    9:48 AM 08/31/2018   10:44 AM 08/29/2017   10:47 AM  CMP  Glucose 70 - 99 mg/dL 111  130  97   BUN 8 - 23 mg/dL _0 Creatinine 0.44 - 1.00 mg/dL 1.06  0.93  0.97   Sodium 135 - 145 mmol/L 142  139  142   Potassium 3.5 - 5.1 mmol/L 4.2  4.0  4.4   Chloride 98 - 111 mmol/L 106  107  106   CO2 22 - 32 mmol/L _1 Calcium 8.9 - 10.3 mg/dL 10.4  9.1  9.8   Total Protein 6.5 - 8.1 g/dL 7.0  6.5  7.0   Total Bilirubin 0.3 - 1.2 mg/dL 0.2  0.2  0.3   Alkaline Phos 38 - 126 U/L 37  34  37   AST 15 - 41 U/L _2 ALT 0 - 44 U/L _3 Lab Results  Component Value  Date   WBC 5.2 09/11/2020   HGB 12.6 09/11/2020   HCT 37.3 09/11/2020   MCV 92.1 09/11/2020   PLT 157 09/11/2020   NEUTROABS 3.4 09/11/2020    ASSESSMENT & PLAN:  No problem-specific Assessment & Plan notes found for this encounter.    No orders of the defined types were placed in this encounter.  The patient has a good understanding of the overall plan. she agrees with it. she will call with any problems that may develop before the next visit here. Total time spent: 30 mins including face to face time and time spent for planning, charting and co-ordination of care   Suzzette Righter, Banks 09/10/21    I Gardiner Coins am scribing for Dr. Lindi Adie  ***

## 2021-09-11 ENCOUNTER — Other Ambulatory Visit: Payer: Self-pay

## 2021-09-11 DIAGNOSIS — C50412 Malignant neoplasm of upper-outer quadrant of left female breast: Secondary | ICD-10-CM

## 2021-09-14 ENCOUNTER — Encounter: Payer: Self-pay | Admitting: *Deleted

## 2021-09-14 ENCOUNTER — Inpatient Hospital Stay (HOSPITAL_BASED_OUTPATIENT_CLINIC_OR_DEPARTMENT_OTHER): Payer: Medicare Other | Admitting: Hematology and Oncology

## 2021-09-14 ENCOUNTER — Inpatient Hospital Stay: Payer: Medicare Other | Attending: Hematology and Oncology

## 2021-09-14 ENCOUNTER — Other Ambulatory Visit: Payer: Self-pay

## 2021-09-14 VITALS — BP 144/77 | HR 76 | Temp 97.8°F | Resp 18 | Ht 63.0 in | Wt 165.9 lb

## 2021-09-14 DIAGNOSIS — Z170 Estrogen receptor positive status [ER+]: Secondary | ICD-10-CM

## 2021-09-14 DIAGNOSIS — C50511 Malignant neoplasm of lower-outer quadrant of right female breast: Secondary | ICD-10-CM | POA: Diagnosis present

## 2021-09-14 DIAGNOSIS — C50412 Malignant neoplasm of upper-outer quadrant of left female breast: Secondary | ICD-10-CM

## 2021-09-14 DIAGNOSIS — M542 Cervicalgia: Secondary | ICD-10-CM | POA: Diagnosis not present

## 2021-09-14 LAB — CMP (CANCER CENTER ONLY)
ALT: 10 U/L (ref 0–44)
AST: 13 U/L — ABNORMAL LOW (ref 15–41)
Albumin: 4 g/dL (ref 3.5–5.0)
Alkaline Phosphatase: 33 U/L — ABNORMAL LOW (ref 38–126)
Anion gap: 3 — ABNORMAL LOW (ref 5–15)
BUN: 13 mg/dL (ref 8–23)
CO2: 30 mmol/L (ref 22–32)
Calcium: 9.7 mg/dL (ref 8.9–10.3)
Chloride: 105 mmol/L (ref 98–111)
Creatinine: 1.04 mg/dL — ABNORMAL HIGH (ref 0.44–1.00)
GFR, Estimated: 54 mL/min — ABNORMAL LOW (ref >60)
Glucose, Bld: 136 mg/dL — ABNORMAL HIGH (ref 70–99)
Potassium: 4.2 mmol/L (ref 3.5–5.1)
Sodium: 138 mmol/L (ref 135–145)
Total Bilirubin: 0.4 mg/dL (ref 0.3–1.2)
Total Protein: 6.9 g/dL (ref 6.5–8.1)

## 2021-09-14 LAB — CBC WITH DIFFERENTIAL (CANCER CENTER ONLY)
Abs Immature Granulocytes: 0.01 10*3/uL (ref 0.00–0.07)
Basophils Absolute: 0.1 10*3/uL (ref 0.0–0.1)
Basophils Relative: 1 %
Eosinophils Absolute: 0.2 10*3/uL (ref 0.0–0.5)
Eosinophils Relative: 3 %
HCT: 38.1 % (ref 36.0–46.0)
Hemoglobin: 12.6 g/dL (ref 12.0–15.0)
Immature Granulocytes: 0 %
Lymphocytes Relative: 21 %
Lymphs Abs: 1 10*3/uL (ref 0.7–4.0)
MCH: 30.4 pg (ref 26.0–34.0)
MCHC: 33.1 g/dL (ref 30.0–36.0)
MCV: 91.8 fL (ref 80.0–100.0)
Monocytes Absolute: 0.6 10*3/uL (ref 0.1–1.0)
Monocytes Relative: 12 %
Neutro Abs: 3.1 10*3/uL (ref 1.7–7.7)
Neutrophils Relative %: 63 %
Platelet Count: 161 10*3/uL (ref 150–400)
RBC: 4.15 MIL/uL (ref 3.87–5.11)
RDW: 14.2 % (ref 11.5–15.5)
WBC Count: 4.9 10*3/uL (ref 4.0–10.5)
nRBC: 0 % (ref 0.0–0.2)

## 2021-09-14 NOTE — Assessment & Plan Note (Addendum)
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  03/25/2015: Breast MRI: Left breast UOQ: ADH, right breast LOQ: Stage IA T1BN0 IDC ER/PR positive HER2 negative Ki-67 4% 04/29/2015: Neoadjuvant chemotherapy with Taxol weekly x4 with trastuzumab (Taxol discontinued for neuropathy), and carboplatin and gemcitabine substituted started 06/10/2018 17x3 doses completed 07/15/2015 04/27/2016: Herceptin completed 08/07/2015: Right lumpectomy: Complete pathologic response; left lumpectomies: T2N1 grade 1 IDC with positive margins, 09/02/2015: Clear the margins 10/07/2015-11/17/2015: Adjuvant radiation  Current treatment: Tamoxifen started 12/23/2015 Breast cancer surveillance: 1.  Breast exam 09/14/2021: Benign 2. mammogram 08/05/2021: Benign breast density category C  We will request breast cancer index on the left breast to determine if she would benefit from extended endocrine therapy.  Neck pain: We will obtain a CT of the cervical spine for further evaluation. Telephone visit after these test to discuss results. Return to clinic in 1 year for follow-up

## 2021-09-14 NOTE — Progress Notes (Signed)
Per MD request RN successfully faxed [facility] request ([phone]). 

## 2021-09-16 ENCOUNTER — Encounter: Payer: Self-pay | Admitting: *Deleted

## 2021-09-16 NOTE — Progress Notes (Signed)
Received call from [facility] testing facility requesting MD office note as well as biopsy results for left breast cancer prior to tx.  RN successfully faxed requested information [phone]).

## 2021-09-17 ENCOUNTER — Telehealth: Payer: Self-pay

## 2021-09-17 NOTE — Telephone Encounter (Signed)
Attempted to call pt as we received fax request from [facility] requesting MM/US/MRI reports prior to 2017. Need to locate records prior to 2017. LVM for call back.

## 2021-09-24 ENCOUNTER — Telehealth: Payer: Self-pay | Admitting: *Deleted

## 2021-09-24 ENCOUNTER — Ambulatory Visit (HOSPITAL_COMMUNITY)
Admission: RE | Admit: 2021-09-24 | Discharge: 2021-09-24 | Disposition: A | Discharge status: Home / Self Care | Payer: Medicare Other | Source: Ambulatory Visit | Attending: Hematology and Oncology | Admitting: Hematology and Oncology

## 2021-09-24 DIAGNOSIS — Z170 Estrogen receptor positive status [ER+]: Secondary | ICD-10-CM | POA: Insufficient documentation

## 2021-09-24 DIAGNOSIS — M542 Cervicalgia: Secondary | ICD-10-CM | POA: Diagnosis present

## 2021-09-24 DIAGNOSIS — C50511 Malignant neoplasm of lower-outer quadrant of right female breast: Secondary | ICD-10-CM | POA: Diagnosis present

## 2021-09-24 NOTE — Telephone Encounter (Signed)
Attempted to contact patient to request records. Office received fax request from [facility] requesting MM/US/MRI reports prior to 2017. Need to locate records prior to 2017. LVM for call back.

## 2021-09-26 NOTE — Progress Notes (Signed)
HEMATOLOGY-ONCOLOGY TELEPHONE VISIT PROGRESS NOTE  I connected with our patient on 09/29/21 at 12:00 PM EDT by telephone and verified that I am speaking with the correct person using two identifiers.  I discussed the limitations, risks, security and privacy concerns of performing an evaluation and management service by telephone and the availability of in person appointments.  I also discussed with the patient that there may be a patient responsible charge related to this service. The patient expressed understanding and agreed to proceed.   History of Present Illness: Isabella Cook is a 81 y.o presents to the clinic via phone visit to discuss ct results.  She was having neck pain and that is where he performed a CT of the neck.  It mainly showed osteoarthritis.  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             (b) right breast lower outer quadrant: a clinical T1b N0, stage IA invasive ductal carcinoma, estrogen and progesterone receptor positive, HER-2 not amplified, with an Mib-1 of 4%   04/29/2015 - 07/15/2015 Neo-Adjuvant Chemotherapy   neoadjuvant chemotherapy consisting of weekly paclitaxel 12 together with weekly trastuzumab started 04/29/2015             (a) paclitaxel discontinued after 4  cycles because of neuropathy             (b) carboplatin/gemcitabine substituted, starting 06/10/2015 (total of 3 doses given, completed 07/01/2015)   08/07/2015 Surgery               (a) right lumpectomy and sentinel lymph node sampling showing ypT0 ypN0 (complete pathologic response)             (b) two left lumpectomies, for ympT2 ypN1 invasive ductal carcinoma, grade 1, with positive margins.   09/02/2015 Surgery   Re excision cleared margins   10/07/2015 - 11/17/2015 Radiation Therapy   adjuvant radiation 10/07/2015 to 11/17/2015  1. The Right breast was treated to 50 Gy in 25 fractions at 2 Gy per fraction. 2. The Left breast (4-field) was treated to 50 Gy in 25 fractions at 2 Gy per fraction. 3. The Left breast was boosted to 10 Gy in 5 fractions at 2 Gy per fraction.    12/2015 -  Anti-estrogen oral therapy   Tamoxifen daily     REVIEW OF SYSTEMS:   Constitutional: Denies fevers, chills or abnormal weight loss All other systems were reviewed with the patient and are negative. Observations/Objective:     Assessment Plan:  Breast cancer of upper-outer quadrant of left female breast ([place]) History of bilateral breast cancers 02/27/2015:mT1-3 N1 invasive ductal carcinoma, grade 2, strongly estrogen and progesterone receptor positive, HER-2 amplified, with an MIB-1  of 20%.  03/25/2015: Breast MRI: Left breast UOQ: ADH, right breast LOQ: Stage IA T1BN0 IDC ER/PR positive HER2 negative Ki-67 4% 04/29/2015: Neoadjuvant chemotherapy with Taxol weekly x4 with trastuzumab (Taxol discontinued for neuropathy), and carboplatin and gemcitabine substituted started 06/10/2018 17x3 doses completed 07/15/2015 04/27/2016: Herceptin completed 08/07/2015: Right lumpectomy: Complete pathologic response; left lumpectomies: T2N1 grade 1 IDC with positive margins, 09/02/2015: Clear the margins 10/07/2015-11/17/2015: Adjuvant radiation   Current treatment: Tamoxifen started 12/23/2015 Breast cancer  surveillance: 1.  Breast exam 09/14/2021: Benign 2. mammogram 08/05/2021: Benign breast density category C   We are waiting on results of breast cancer index to determine if she would benefit from extended endocrine therapy.  I will call her with the result of this test.   Neck pain: CT neck 09/27/2021: Osteoarthritis . Return to clinic in 1 year for follow-up    I discussed the assessment and treatment plan with the patient. The patient was provided an opportunity to ask questions and all were answered. The patient agreed with the plan and demonstrated an understanding of the instructions. The patient was advised to call back or seek an in-person evaluation if the symptoms worsen or if the condition fails to improve as anticipated.   I provided 12 minutes of non-face-to-face time during this encounter.  This includes time for charting and coordination of care   Viinay K Gudena, MD  I Deritra, Mcnairy am scribing for Dr. Gudena  I have reviewed the above documentation for accuracy and completeness, and I agree with the above.   

## 2021-09-29 ENCOUNTER — Inpatient Hospital Stay (HOSPITAL_BASED_OUTPATIENT_CLINIC_OR_DEPARTMENT_OTHER): Payer: Medicare Other | Admitting: Hematology and Oncology

## 2021-09-29 ENCOUNTER — Encounter: Payer: Self-pay | Admitting: Oncology

## 2021-09-29 DIAGNOSIS — C50412 Malignant neoplasm of upper-outer quadrant of left female breast: Secondary | ICD-10-CM | POA: Diagnosis not present

## 2021-09-29 NOTE — Assessment & Plan Note (Addendum)
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  03/25/2015: Breast MRI: Left breast UOQ: ADH, right breast LOQ: Stage IA T1BN0 IDC ER/PR positive HER2 negative Ki-67 4% 04/29/2015: Neoadjuvant chemotherapy with Taxol weekly x4 with trastuzumab (Taxol discontinued for neuropathy), and carboplatin and gemcitabine substituted started 06/10/2018 17x3 doses completed 07/15/2015 04/27/2016: Herceptin completed 08/07/2015: Right lumpectomy: Complete pathologic response; left lumpectomies: T2N1 grade 1 IDC with positive margins, 09/02/2015: Clear the margins 10/07/2015-11/17/2015: Adjuvant radiation  Current treatment: Tamoxifen started 12/23/2015 Breast cancer surveillance: 1.  Breast exam 09/14/2021: Benign 2. mammogram 08/05/2021: Benign breast density category C  We are waiting on results of breast cancer index to determine if she would benefit from extended endocrine therapy.  I will call her with the result of this test.  Neck pain: CT neck 09/27/2021: Osteoarthritis . Return to clinic in 1 year for follow-up 

## 2021-10-07 ENCOUNTER — Other Ambulatory Visit: Payer: Self-pay | Admitting: Family Medicine

## 2021-10-07 DIAGNOSIS — M810 Age-related osteoporosis without current pathological fracture: Secondary | ICD-10-CM

## 2021-10-21 ENCOUNTER — Telehealth: Payer: Self-pay

## 2021-10-21 MED ORDER — TAMOXIFEN CITRATE 20 MG PO TABS: 20.0000 mg | ORAL_TABLET | Freq: Every day | ORAL | 3 refills | Status: DC

## 2021-10-21 NOTE — Telephone Encounter (Signed)
Called pt to advise [facility] testing is still pending. Pt states she needs refill for tamoxifen. Refill sent per MD.

## 2021-12-03 ENCOUNTER — Encounter: Payer: Self-pay | Admitting: Hematology and Oncology

## 2022-03-23 ENCOUNTER — Ambulatory Visit
Admission: RE | Admit: 2022-03-23 | Discharge: 2022-03-23 | Disposition: A | Discharge status: Home / Self Care | Payer: Medicare Other | Source: Ambulatory Visit | Attending: Family Medicine | Admitting: Family Medicine

## 2022-03-23 DIAGNOSIS — M810 Age-related osteoporosis without current pathological fracture: Secondary | ICD-10-CM

## 2022-06-22 ENCOUNTER — Other Ambulatory Visit: Payer: Self-pay | Admitting: Hematology and Oncology

## 2022-06-22 DIAGNOSIS — Z9889 Other specified postprocedural states: Secondary | ICD-10-CM

## 2022-08-09 ENCOUNTER — Ambulatory Visit
Admission: RE | Admit: 2022-08-09 | Discharge: 2022-08-09 | Disposition: A | Discharge status: Home / Self Care | Payer: Medicare Other | Source: Ambulatory Visit | Attending: Hematology and Oncology | Admitting: Hematology and Oncology

## 2022-08-09 DIAGNOSIS — Z9889 Other specified postprocedural states: Secondary | ICD-10-CM

## 2022-09-13 NOTE — Progress Notes (Signed)
Patient Care Team: Tacey Ruiz, FNP as PCP - General (Family Medicine) Magrinat, Valentino Hue, MD (Inactive) as Consulting Physician (Oncology) Harriette Bouillon, MD as Consulting Physician (General Surgery) Maeola Harman, MD as Consulting Physician (Neurosurgery) Delora Fuel, MD as Referring Physician (Otolaryngology) Bensimhon, Bevelyn Buckles, MD as Consulting Physician (Cardiology) Axel Filler Larna Daughters, NP as Nurse Practitioner (Hematology and Oncology) Armbruster, Willaim Rayas, MD as Consulting Physician (Gastroenterology)  DIAGNOSIS: No diagnosis found.  SUMMARY OF ONCOLOGIC HISTORY: Oncology History  Breast cancer of lower-outer quadrant of right female breast ([facility])  02/27/2015 Initial Biopsy    left breast upper outer quadrant biopsy 02/27/2015 for a clinical mT1-3 N1 invasive ductal carcinoma, grade 2, strongly estrogen and progesterone receptor positive, HER-2 amplified, with an MIB-1 of 20%.              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             (b) right breast lower outer quadrant: a clinical T1b N0, stage IA invasive ductal carcinoma, estrogen and progesterone receptor positive, HER-2 not amplified, with an Mib-1 of 4%   04/29/2015 - 07/15/2015 Neo-Adjuvant Chemotherapy   neoadjuvant chemotherapy consisting of weekly paclitaxel 12 together with weekly trastuzumab started 04/29/2015             (a) 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   09/02/2015 Surgery   Re excision cleared margins   10/07/2015 - 11/17/2015 Radiation Therapy   adjuvant radiation 10/07/2015 to 11/17/2015  1. The Right breast was treated to 50 Gy in 25 fractions at 2 Gy per fraction. 2. The Left breast (4-field) was treated to 50 Gy in 25 fractions at 2 Gy per fraction. 3. The Left breast was boosted to 10 Gy in 5 fractions at 2 Gy per fraction.    12/2015 -  Anti-estrogen oral therapy   Tamoxifen daily     CHIEF COMPLIANT: Follow-up breast cancer currently on Tamoxifen   INTERVAL HISTORY: Isabella Cook is a 82 y.o the above mention. She presents to the clinic for a follow-up.    ALLERGIES:  is allergic to beef (bovine) protein, egg-derived products, gelatin, gluten meal, lactalbumin, lambs quarters, milk-related compounds, pork allergy, pork-derived products, poultry meal, sulfa antibiotics, tilactase, wheat, whey, yeast, black walnut pollen, soy allergy, almond oil, corylus, other, and peanut oil.  MEDICATIONS:  Current Outpatient Medications  Medication Sig Dispense Refill   Biotin 5000 MCG CAPS Take 5,000 mcg by mouth. (Patient not taking: Reported on 02/21/2020)     calcium carbonate (TUMS - DOSED IN MG ELEMENTAL CALCIUM)  500 MG chewable tablet Chew 500 mg by mouth daily as needed for indigestion or heartburn.      clobetasol (TEMOVATE) 0.05 % external solution SMARTSIG:Sparingly Topical Daily     Cyanocobalamin (VITAMIN B-12 PO) Take 5 mg by mouth daily. (Patient not taking: Reported on 02/21/2020)     diclofenac Sodium (VOLTAREN) 1 % GEL SMARTSIG:2 Gram(s) Topical 4 Times Daily PRN     diphenhydrAMINE (SOMINEX) 25 MG tablet Take 25 mg by mouth at bedtime as needed. Reported on 05/20/2015     Doxycycline Hyclate 50 MG TABS  Take 1 tablet by mouth daily.     EPINEPHrine 0.3 mg/0.3 mL IJ SOAJ injection Inject into the muscle.     estradiol (ESTRING) 2 MG vaginal ring Place 2 mg vaginally every 3 (three) months. follow package directions     famotidine (PEPCID) 20 MG tablet Take 20 mg by mouth daily.      Liothyronine Sodium POWD 5 mcg by Does not apply route.     loratadine (CLARITIN) 10 MG tablet Take 10 mg by mouth daily.     Magnesium Citrate 100 MG TABS Take 400 mg by mouth daily.      melatonin 3 MG TABS tablet Take 3 mg by mouth at bedtime as needed and may repeat dose one time if needed.     Methylcellulose, Laxative, 500 MG TABS Take 2,000 mg by mouth 2 (two) times daily.     naproxen sodium (ANAPROX) 220 MG tablet Take 440 mg by mouth 3 (three) times daily with meals as needed.      Probiotic Product (ALIGN PO) Take 1 tablet by mouth daily.      QUERCETIN PO Take 1 tablet by mouth once a week. (Patient not taking: No sig reported)     tamoxifen (NOLVADEX) 20 MG tablet Take 1 tablet (20 mg total) by mouth daily. 90 tablet 3   VITAMIN A PO Take 5,000 Units by mouth daily. (Patient not taking: No sig reported)     VITAMIN D-VITAMIN K PO Take 1 capsule by mouth daily. Reported on 04/15/2015     No current facility-administered medications for this visit.    PHYSICAL EXAMINATION: ECOG PERFORMANCE STATUS: {CHL ONC ECOG [phone]}  There were no vitals filed for this visit. There were no vitals filed for this visit.  BREAST:*** No palpable masses or nodules in either right or left breasts. No palpable axillary supraclavicular or infraclavicular adenopathy no breast tenderness or nipple discharge. (exam performed in the presence of a chaperone)  LABORATORY DATA:  I have reviewed the data as listed    Latest Ref Rng & Units 09/14/2021    9:40 AM 09/03/2019    9:48 AM 08/31/2018   10:44 AM  CMP  Glucose 70 - 99 mg/dL 782  956  213   BUN 8 - 23 mg/dL 13  12  12    Creatinine 0.44 - 1.00 mg/dL 0.86  5.78   4.69   Sodium 135 - 145 mmol/L 138  142  139   Potassium 3.5 - 5.1 mmol/L 4.2  4.2  4.0   Chloride 98 - 111 mmol/L 105  106  107   CO2 22 - 32 mmol/L 30  27  25    Calcium 8.9 - 10.3 mg/dL 9.7  62.9  9.1   Total Protein 6.5 - 8.1 g/dL 6.9  7.0  6.5   Total Bilirubin 0.3 - 1.2 mg/dL 0.4  0.2  0.2   Alkaline Phos 38 - 126  U/L 33  37  34   AST 15 - 41 U/L 13  17  15    ALT 0 - 44 U/L 10  12  12      Lab Results  Component Value Date   WBC 4.9 09/14/2021   HGB 12.6 09/14/2021   HCT 38.1 09/14/2021   MCV 91.8 09/14/2021   PLT 161 09/14/2021   NEUTROABS 3.1 09/14/2021    ASSESSMENT & PLAN:  No problem-specific Assessment & Plan notes found for this encounter.    No orders of the defined types were placed in this encounter.  The patient has a good understanding of the overall plan. she agrees with it. she will call with any problems that may develop before the next visit here. Total time spent: 30 mins including face to face time and time spent for planning, charting and co-ordination of care   Sherlyn Lick, CMA 09/13/22    I Janan Ridge am acting as a Neurosurgeon for [employer]  ***

## 2022-09-15 ENCOUNTER — Inpatient Hospital Stay: Payer: Medicare Other | Attending: Hematology and Oncology | Admitting: Hematology and Oncology

## 2022-09-15 ENCOUNTER — Other Ambulatory Visit: Payer: Self-pay | Admitting: *Deleted

## 2022-09-15 ENCOUNTER — Inpatient Hospital Stay: Payer: Medicare Other

## 2022-09-15 VITALS — BP 145/86 | HR 74 | Temp 97.5°F | Resp 18 | Ht 63.0 in | Wt 163.3 lb

## 2022-09-15 DIAGNOSIS — C50511 Malignant neoplasm of lower-outer quadrant of right female breast: Secondary | ICD-10-CM | POA: Insufficient documentation

## 2022-09-15 DIAGNOSIS — Z170 Estrogen receptor positive status [ER+]: Secondary | ICD-10-CM | POA: Diagnosis not present

## 2022-09-15 DIAGNOSIS — Z79810 Long term (current) use of selective estrogen receptor modulators (SERMs): Secondary | ICD-10-CM | POA: Diagnosis not present

## 2022-09-15 DIAGNOSIS — M8580 Other specified disorders of bone density and structure, unspecified site: Secondary | ICD-10-CM | POA: Diagnosis not present

## 2022-09-15 DIAGNOSIS — Z9221 Personal history of antineoplastic chemotherapy: Secondary | ICD-10-CM | POA: Insufficient documentation

## 2022-09-15 DIAGNOSIS — Z923 Personal history of irradiation: Secondary | ICD-10-CM | POA: Diagnosis not present

## 2022-09-15 DIAGNOSIS — C50912 Malignant neoplasm of unspecified site of left female breast: Secondary | ICD-10-CM | POA: Diagnosis not present

## 2022-09-15 DIAGNOSIS — C50412 Malignant neoplasm of upper-outer quadrant of left female breast: Secondary | ICD-10-CM

## 2022-09-15 LAB — CMP (CANCER CENTER ONLY)
ALT: 10 U/L (ref 0–44)
AST: 15 U/L (ref 15–41)
Albumin: 4 g/dL (ref 3.5–5.0)
Alkaline Phosphatase: 34 U/L — ABNORMAL LOW (ref 38–126)
Anion gap: 3 — ABNORMAL LOW (ref 5–15)
BUN: 11 mg/dL (ref 8–23)
CO2: 30 mmol/L (ref 22–32)
Calcium: 9.7 mg/dL (ref 8.9–10.3)
Chloride: 107 mmol/L (ref 98–111)
Creatinine: 0.99 mg/dL (ref 0.44–1.00)
GFR, Estimated: 57 mL/min — ABNORMAL LOW (ref >60)
Glucose, Bld: 95 mg/dL (ref 70–99)
Potassium: 4.2 mmol/L (ref 3.5–5.1)
Sodium: 140 mmol/L (ref 135–145)
Total Bilirubin: 0.4 mg/dL (ref 0.3–1.2)
Total Protein: 7 g/dL (ref 6.5–8.1)

## 2022-09-15 LAB — CBC WITH DIFFERENTIAL (CANCER CENTER ONLY)
Abs Immature Granulocytes: 0.01 10*3/uL (ref 0.00–0.07)
Basophils Absolute: 0 10*3/uL (ref 0.0–0.1)
Basophils Relative: 1 %
Eosinophils Absolute: 0.2 10*3/uL (ref 0.0–0.5)
Eosinophils Relative: 3 %
HCT: 38.5 % (ref 36.0–46.0)
Hemoglobin: 12.8 g/dL (ref 12.0–15.0)
Immature Granulocytes: 0 %
Lymphocytes Relative: 25 %
Lymphs Abs: 1.3 10*3/uL (ref 0.7–4.0)
MCH: 30.7 pg (ref 26.0–34.0)
MCHC: 33.2 g/dL (ref 30.0–36.0)
MCV: 92.3 fL (ref 80.0–100.0)
Monocytes Absolute: 0.6 10*3/uL (ref 0.1–1.0)
Monocytes Relative: 12 %
Neutro Abs: 3 10*3/uL (ref 1.7–7.7)
Neutrophils Relative %: 59 %
Platelet Count: 160 10*3/uL (ref 150–400)
RBC: 4.17 MIL/uL (ref 3.87–5.11)
RDW: 13.6 % (ref 11.5–15.5)
WBC Count: 5 10*3/uL (ref 4.0–10.5)
nRBC: 0 % (ref 0.0–0.2)

## 2022-09-15 MED ORDER — TAMOXIFEN CITRATE 20 MG PO TABS: 20.0000 mg | ORAL_TABLET | Freq: Every day | ORAL | 3 refills | Status: DC

## 2022-09-15 NOTE — Assessment & Plan Note (Signed)
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  03/25/2015: Breast MRI: Left breast UOQ: ADH, right breast LOQ: Stage IA T1BN0 IDC ER/PR positive HER2 negative Ki-67 4% 04/29/2015: Neoadjuvant chemotherapy with Taxol weekly x4 with trastuzumab (Taxol discontinued for neuropathy), and carboplatin and gemcitabine substituted started 06/10/2018 17x3 doses completed 07/15/2015 04/27/2016: Herceptin completed 08/07/2015: Right lumpectomy: Complete pathologic response; left lumpectomies: T2N1 grade 1 IDC with positive margins, 09/02/2015: Clear the margins 10/07/2015-11/17/2015: Adjuvant radiation   Current treatment: Tamoxifen started 12/23/2015 Breast cancer surveillance: 1.  Breast exam 09/15/2022: Benign 2. mammogram 08/09/22: Benign breast density category C   BCI predicted benefit for extended endocrine therapy (10.1% risk over next 5 years) With 10 years of HT (Risk 3.3%-4.2%)   Neck pain: CT neck 09/27/2021: Osteoarthritis . Return to clinic in 1 year for follow-up

## 2022-11-19 ENCOUNTER — Encounter: Payer: Self-pay | Admitting: Oncology

## 2022-11-19 NOTE — Telephone Encounter (Signed)
See telephone call

## 2023-06-27 ENCOUNTER — Other Ambulatory Visit: Payer: Self-pay | Admitting: Hematology and Oncology

## 2023-06-27 DIAGNOSIS — Z1231 Encounter for screening mammogram for malignant neoplasm of breast: Secondary | ICD-10-CM

## 2023-08-01 ENCOUNTER — Encounter: Payer: Self-pay | Admitting: Neurology

## 2023-08-09 ENCOUNTER — Other Ambulatory Visit: Payer: Self-pay | Admitting: Hematology and Oncology

## 2023-08-10 ENCOUNTER — Ambulatory Visit
Admission: RE | Admit: 2023-08-10 | Discharge: 2023-08-10 | Disposition: A | Discharge status: Home / Self Care | Source: Ambulatory Visit | Attending: Hematology and Oncology

## 2023-08-10 DIAGNOSIS — Z1231 Encounter for screening mammogram for malignant neoplasm of breast: Secondary | ICD-10-CM

## 2023-08-12 NOTE — Progress Notes (Signed)
 NEUROLOGY CONSULTATION NOTE  Isabella Cook MRN: [id number] DOB: [birth year]  Referring provider: Damien Coupe, FNP Primary care provider: Damien Coupe, FNP  Reason for consult:  Stroke  Assessment/Plan:   Left ACA territory ischemic infarct, likely secondary to unknown embolic source Former smoker   Secondary stroke prevention as managed by PCP: As she was not on antiplatelet therapy prior to stroke, she may discontinue Plavix and restart ASA 81mg  daily alone. LDL goal less than 70.  Patient declines to start a statin Normotensive blood pressure Hgb A1c goal less than 7 In addition, lifestyle modification such as Mediterranean diet and routine exercise. Further stroke workup: 30 day cardiac event monitor. Follow up 6 months.   Subjective:  Isabella Cook is an 83 year old right-handed female with hypothyroidism, arthritis and history of former smoker and left breast cancer (2017) s/p lumpectomy, radiation and chemo who presents for stroke.  History supplemented by hospital, her accompanying husband and referring provider's notes.   Current medications:  Plavix 75mg  daily  On 07/07/2023, she woke up and had trouble getting out of bed.  Her left leg wasn't working right.  She also was slurring her speech.  Due to persistent symptoms, she presented to [facility] ED on 07/11/2023 where BP was 180s/60s and exam also demonstrated numbness involving her right leg.  EKG was unremarkable.  CT head showed no acute intracranial abnormality.  CTA head and neck revealed intracranial atherosclerosis but no LVO or hemodynamically significant stenosis.  Did not receive anti-thrombotic therapy as she was outside therapeutic window.  Echo revealed normal LVEF and no cardiac source of emboli.  Hgb A1c was 5.5 and LDL was 87.  CXR and UA ruled out infection.  She was discharged on ASA 81mg  and Plavix 75mg  daily for 3 weeks followed by Plavix alone.  She followed up with her PCP.  MRI of brain with and  without contrast on 07/14/2023 revealed late acute to early subacute left high medial frontal lobe ACA territory infarct.  She endorses right hip/leg pain.  X-ray of lumbar spine on 07/13/2023 showed multilevel lumbar degenerative disc disease greates and severe at L4-L5.  X-ray of right hip showed mild osteoarthritis.  She never received PT but strength has significantly improved.  Still with slurred speech and is scheduled to start speech therapy.  Prior to the stroke, she did have RSV.  Denies palpitations.  PAST MEDICAL HISTORY: Past Medical History:  Diagnosis Date   Arthritis    wrists and knees   Cancer (HCC) 03/2015   left breast   GERD (gastroesophageal reflux disease)    History of hiatal hernia    History of jaundice as a child    History of radiation therapy completed 11/17/15   50 Gy to Right Breast, 60 Gy to Left Breast with regional nodal treatment on Left as well.    Hypothyroidism    Multiple food allergies    Osteopenia    Personal history of chemotherapy    Personal history of radiation therapy    Urinary tract infection     PAST SURGICAL HISTORY: Past Surgical History:  Procedure Laterality Date   ABDOMINAL HYSTERECTOMY     APPENDECTOMY     BACK SURGERY     lumbar   BREAST LUMPECTOMY Bilateral    Oct. 2017   BREAST LUMPECTOMY WITH RADIOACTIVE SEED AND SENTINEL LYMPH NODE BIOPSY Bilateral 08/07/2015   Procedure: LEFT BREAST LUMPECTOMY LOCALIZED WITH 2 RADIOACTIVE SEEDS, RIGHT BREAST SEED GUIDED LUMPECTOMY, BILATERAL SENTINEL LYMPH NODE  MAPPING;  Surgeon: Debby Shipper, MD;  Location: [facility];  Service: General;  Laterality: Bilateral;   BREAST RECONSTRUCTION Bilateral 09/02/2015   Procedure: BILATERAL ONCOPLASTIC RECONSTRUCTION WITH MASTOPEXY;  Surgeon: Earlis Ranks, MD;  Location: [facility];  Service: Plastics;  Laterality: Bilateral;   CATARACT EXTRACTION     CERVICAL DISC SURGERY  2016   CHOLECYSTECTOMY     COLONOSCOPY   2014   HERNIA REPAIR     UHR   MASTOPEXY Bilateral 09/02/2015   Procedure: MASTOPEXY;  Surgeon: Earlis Ranks, MD;  Location: [facility];  Service: Plastics;  Laterality: Bilateral;   PORT-A-CATH REMOVAL Right 07/19/2016   Procedure: REMOVAL PORT-A-CATH;  Surgeon: Shipper Debby, MD;  Location: [facility];  Service: General;  Laterality: Right;  ANESTHESIA - MAC/LOCAL   PORTACATH PLACEMENT Right 04/22/2015   Procedure: INSERTION PORT-A-CATH WITH US ;  Surgeon: Debby Shipper, MD;  Location: [facility];  Service: General;  Laterality: Right;   RE-EXCISION OF BREAST LUMPECTOMY Left 09/02/2015   Procedure: RE-EXCISION OF LEFT BREAST LUMPECTOMY;  Surgeon: Debby Shipper, MD;  Location: [facility];  Service: General;  Laterality: Left;   REDUCTION MAMMAPLASTY Bilateral    TUBAL LIGATION      MEDICATIONS: Current Outpatient Medications on File Prior to Visit  Medication Sig Dispense Refill   calcium carbonate (TUMS - DOSED IN MG ELEMENTAL CALCIUM) 500 MG chewable tablet Chew 500 mg by mouth daily as needed for indigestion or heartburn.      clobetasol (TEMOVATE) 0.05 % external solution SMARTSIG:Sparingly Topical Daily     diclofenac Sodium (VOLTAREN) 1 % GEL SMARTSIG:2 Gram(s) Topical 4 Times Daily PRN     diphenhydrAMINE  (SOMINEX) 25 MG tablet Take 25 mg by mouth at bedtime as needed. Reported on 05/20/2015     Doxycycline Hyclate 50 MG TABS Take 1 tablet by mouth daily.     EPINEPHrine  0.3 mg/0.3 mL IJ SOAJ injection Inject into the muscle.     estradiol (ESTRING) 2 MG vaginal ring Place 2 mg vaginally every 3 (three) months. follow package directions     famotidine  (PEPCID ) 20 MG tablet Take 20 mg by mouth daily.      Liothyronine Sodium POWD 5 mcg by Does not apply route.     loratadine (CLARITIN) 10 MG tablet Take 10 mg by mouth daily.     Magnesium Citrate 100 MG TABS Take 400 mg by mouth daily.      melatonin 3 MG TABS tablet Take 3 mg by mouth at  bedtime as needed and may repeat dose one time if needed.     Methylcellulose, Laxative, 500 MG TABS Take 2,000 mg by mouth 2 (two) times daily.     naproxen sodium (ANAPROX) 220 MG tablet Take 440 mg by mouth 3 (three) times daily with meals as needed.      Probiotic Product (ALIGN PO) Take 1 tablet by mouth daily.      tamoxifen  (NOLVADEX ) 20 MG tablet TAKE 1 TABLET BY MOUTH EVERY DAY 90 tablet 0   VITAMIN D-VITAMIN K PO Take 1 capsule by mouth daily. Reported on 04/15/2015     No current facility-administered medications on file prior to visit.    ALLERGIES: Allergies  Allergen Reactions   Beef (Bovine) Protein Anaphylaxis    Muscle pain,  Takes claritin every day and benadryl , if needed, to prevent anaphylaxis  Muscle pain,  Takes claritin every day and benadryl , if needed, to prevent anaphylaxis  Egg-Derived Products Anaphylaxis    Nasal stuffiness, Takes claritin every day and benadryl , if needed, to prevent anaphylaxis Nasal stuffiness, Takes claritin every day and benadryl , if needed, to prevent anaphylaxis   Gelatin Anaphylaxis    Muscle pain,  Takes claritin every day and benadryl , if needed, to prevent anaphylaxis   Gluten Meal Anaphylaxis and Diarrhea    Takes claritin every day and benadryl , if needed, to prevent anaphylaxis  Takes claritin every day and benadryl , if needed, to prevent anaphylaxis   Lactalbumin Anaphylaxis    Nasal stuffiness Cheese (mozzarella, swiss, cheddar, cottage)   Lambs Quarters Anaphylaxis    ALL mammal MEAT per pt -- Muscle pain  Can eat chicken, fish, [place]   Milk-Related Compounds Anaphylaxis and Other (See Comments)    Nasal stuffiness Cheese (mozzarella, swiss, cheddar, cottage) Nasal stuffiness   Pork Allergy Anaphylaxis    ALL mammal MEAT per pt -- Muscle pain  Can eat chicken, fish, [place] ALL mammal MEAT per pt -- Muscle pain  Can eat chicken, fish, [place]   Pork-Derived Products Anaphylaxis    ALL mammal MEAT per pt --  Muscle pain  Can eat chicken, fish, [place]   [employer] Anaphylaxis    Nasal stuffiness, Takes claritin every day and benadryl , if needed, to prevent anaphylaxis Nasal stuffiness, Takes claritin every day and benadryl , if needed, to prevent anaphylaxis Nasal stuffiness, Takes claritin every day and benadryl , if needed, to prevent anaphylaxis    Sulfa Antibiotics Anaphylaxis    ALL mammal MEAT per pt -- Muscle pain  Can eat chicken, fish, [place]   Tilactase Anaphylaxis     Takes claritin every day and benadryl , if needed, to prevent anaphylaxis   Takes claritin every day and benadryl , if needed, to prevent anaphylaxis   Wheat Anaphylaxis and Diarrhea     Takes claritin every day and benadryl , if needed, to prevent anaphylaxis   Takes claritin every day and benadryl , if needed, to prevent anaphylaxis   Whey Anaphylaxis and Diarrhea     Takes claritin every day and benadryl , if needed, to prevent anaphylaxis  Nasal stuffiness Cheese (mozzarella, swiss, cheddar, cottage)  Takes claritin every day and benadryl , if needed, to prevent anaphylaxis   Yeast Anaphylaxis     Takes claritin every day and benadryl , if needed, to prevent anaphylaxis   [employer] Pollen Other (See Comments)   Soy Allergy (Obsolete) Diarrhea    Other reaction(s): OTHER   Almond Oil Nausea And Vomiting   Corylus    Other     Multiple food allergies   Peanut Oil     FAMILY HISTORY: Family History  Problem Relation Age of Onset   Diabetes Mother    Diabetes Sister    Colon cancer Neg Hx    Breast cancer Neg Hx    Esophageal cancer Neg Hx    Rectal cancer Neg Hx    Stomach cancer Neg Hx     Objective:  Blood pressure (!) 143/67, pulse 75, height 5' 3.5 (1.613 m), weight 157 lb 12.8 oz (71.6 kg), SpO2 97%. General: No acute distress.  Patient appears well-groomed.   Head:  Normocephalic/atraumatic Eyes:  fundi examined but not visualized Neck: supple, no paraspinal tenderness, full range of  motion Heart: regular rate and rhythm Neurological Exam: Mental status: alert and oriented to person, place, and time, speech dysarthric but fluent language intact. Cranial nerves: CN I: not tested CN II: pupils equal, round and reactive to light, visual fields intact CN III,  IV, VI:  full range of motion, no nystagmus, no ptosis CN V: facial sensation intact. CN VII: upper and lower face symmetric CN VIII: hearing intact CN IX, X: gag intact, uvula midline CN XI: sternocleidomastoid and trapezius muscles intact CN XII: tongue midline Bulk & Tone: normal, no fasciculations. Motor:  muscle strength with trace weakness in left biceps andd hip flexion; otherwise 5/5 throughout Sensation:  Pinprick and vibratory sensation intact. Deep Tendon Reflexes:  2+ throughout,  toes downgoing.   Finger to nose testing:  Without dysmetria.   Gait:  Normal station and stride.  able to tandem walk.  Romberg negative.    Thank you for allowing me to take part in the care of this patient.  Juliene Dunnings, DO  CC: Damien Coupe, FNP

## 2023-08-15 ENCOUNTER — Ambulatory Visit (INDEPENDENT_AMBULATORY_CARE_PROVIDER_SITE_OTHER): Admitting: Neurology

## 2023-08-15 ENCOUNTER — Encounter: Payer: Self-pay | Admitting: Neurology

## 2023-08-15 ENCOUNTER — Other Ambulatory Visit: Payer: Self-pay | Admitting: Neurology

## 2023-08-15 VITALS — BP 143/67 | HR 75 | Ht 63.5 in | Wt 157.8 lb

## 2023-08-15 DIAGNOSIS — I639 Cerebral infarction, unspecified: Secondary | ICD-10-CM

## 2023-08-15 DIAGNOSIS — I63421 Cerebral infarction due to embolism of right anterior cerebral artery: Secondary | ICD-10-CM | POA: Diagnosis not present

## 2023-08-15 NOTE — Patient Instructions (Signed)
 30 day heart monitor Stop Plavix.  Just take aspirin 81mg  daily alone Consider discussing with your PCP about alternative medications to decrease cholesterol.  LDL goal less than 70 Speech therapy Mediterranean diet (see below) Routine exercise Follow up 6 months   Mediterranean Diet A Mediterranean diet is based on the traditions of countries on the [employer]. It focuses on eating more: Fruits and vegetables. Whole grains, beans, nuts, and seeds. Heart-healthy fats. These are fats that are good for your heart. It involves eating less: Dairy. Meat and eggs. Processed foods with added sugar, salt, and fat. This type of diet can help prevent certain conditions. It can also improve outcomes if you have a long-term (chronic) disease, such as kidney or heart disease. What are tips for following this plan? Reading food labels Check packaged foods for: The serving size. For foods such as rice and pasta, the serving size is the amount of cooked product, not dry. The total fat. Avoid foods with saturated fat or trans fat. Added sugars, such as corn syrup. Shopping  Try to have a balanced diet. Buy a variety of foods, such as: Fresh fruits and vegetables. You may be able to get these from local farmers markets. You can also buy them frozen. Grains, beans, nuts, and seeds. Some of these can be bought in bulk. Fresh seafood. Poultry and eggs. Low-fat dairy products. Buy whole ingredients instead of foods that have already been packaged. If you can't get fresh seafood, buy precooked frozen shrimp or canned fish, such as tuna, salmon, or sardines. Stock your pantry so you always have certain foods on hand, such as olive oil, canned tuna, canned tomatoes, rice, pasta, and beans. Cooking Cook foods with extra-virgin olive oil instead of using butter or other vegetable oils. Have meat as a side dish. Have vegetables or grains as your main dish. This means having meat in small portions or  adding small amounts of meat to foods like pasta or stew. Use beans or vegetables instead of meat in common dishes like chili or lasagna. Try out different cooking methods. Try roasting, broiling, steaming, and sauting vegetables. Add frozen vegetables to soups, stews, pasta, or rice. Add nuts or seeds for added healthy fats and plant protein at each meal. You can add these to yogurt, salads, or vegetable dishes. Marinate fish or vegetables using olive oil, lemon juice, garlic, and fresh herbs. Meal planning Plan to eat a vegetarian meal one day each week. Try to work up to two vegetarian meals, if possible. Eat seafood two or more times a week. Have healthy snacks on hand. These may include: Vegetable sticks with hummus. Greek yogurt. Fruit and nut trail mix. Eat balanced meals. These should include: Fruit: 2-3 servings a day. Vegetables: 4-5 servings a day. Low-fat dairy: 2 servings a day. Fish, poultry, or lean meat: 1 serving a day. Beans and legumes: 2 or more servings a week. Nuts and seeds: 1-2 servings a day. Whole grains: 6-8 servings a day. Extra-virgin olive oil: 3-4 servings a day. Limit red meat and sweets to just a few servings a month. Lifestyle  Try to cook and eat meals with your family. Drink enough fluid to keep your pee (urine) pale yellow. Be active every day. This includes: Aerobic exercise, which is exercise that causes your heart to beat faster. Examples include running and swimming. Leisure activities like gardening, walking, or housework. Get 7-8 hours of sleep each night. Drink red wine if your provider says you can. A  glass of wine is 5 oz (150 mL). You may be allowed to have: Up to 1 glass a day if you're female and not pregnant. Up to 2 glasses a day if you're female. What foods should I eat? Fruits Apples. Apricots. Avocado. Berries. Bananas. Cherries. Dates. Figs. Grapes. Lemons. Melon. Oranges. Peaches. Plums. Pomegranate. Vegetables Artichokes.  Beets. Broccoli. Cabbage. Carrots. Eggplant. Green beans. Chard. Kale. Spinach. Onions. Leeks. Peas. Squash. Tomatoes. Peppers. Radishes. Grains Whole-grain pasta. Brown rice. Bulgur wheat. Polenta. Couscous. Whole-wheat bread. [place] Madeira. Meats and other proteins Beans. Almonds. Sunflower seeds. Pine nuts. Peanuts. Cod. Salmon. Scallops. Shrimp. Tuna. Tilapia. Clams. Oysters. Eggs. Chicken or [place] without skin. Dairy Low-fat milk. Cheese. Greek yogurt. Fats and oils Extra-virgin olive oil. Avocado oil. Grapeseed oil. Beverages Water. Red wine. Herbal tea. Sweets and desserts Greek yogurt with honey. Baked apples. Poached pears. Trail mix. Seasonings and condiments Basil. Cilantro. Coriander. Cumin. Mint. Parsley. Sage. Rosemary. Tarragon. Garlic. Oregano. Thyme. Pepper. Balsamic vinegar. Tahini. Hummus. Tomato sauce. Olives. Mushrooms. The items listed above may not be all the foods and drinks you can have. Talk to a dietitian to learn more. What foods should I limit? This is a list of foods that should be eaten rarely. Fruits Fruit canned in syrup. Vegetables Deep-fried potatoes, like [place] fries. Grains Packaged pasta or rice dishes. Cereal with added sugar. Snacks with added sugar. Meats and other proteins Beef. Pork. Lamb. Chicken or [place] with skin. Hot dogs. [place]. Dairy Ice cream. Sour cream. Whole milk. Fats and oils Butter. Canola oil. Vegetable oil. Beef fat (tallow). Lard. Beverages Juice. Sugar-sweetened soft drinks. Beer. Liquor and spirits. Sweets and desserts Cookies. Cakes. Pies. Candy. Seasonings and condiments Mayonnaise. Pre-made sauces and marinades. The items listed above may not be all the foods and drinks you should limit. Talk to a dietitian to learn more. Where to find more information American Heart Association (AHA): heart.org This information is not intended to replace advice given to you by your health care provider. Make sure you discuss  any questions you have with your health care provider. Document Revised: 04/04/2022 Document Reviewed: 04/04/2022 Elsevier Patient Education  2024 [employer].

## 2023-09-20 NOTE — Progress Notes (Signed)
 [facility] SPEECH THERAPY [place] [facility] 09/20/2023   Patient Name: Isabella Cook Date of Birth:Jan 25, 1940 Session Number: 4 Diagnosis:  Encounter Diagnosis  Name Primary?  . Cerebrovascular accident (CVA), unspecified mechanism    (CMS-HCC) Yes       Date of Evaluation: 08/25/23 Date of Symptom Onset: 08/25/23 Referred by: Damien POUR. Dolleschel, FNP      Dates of Certification: Effective Range of SLP Plan of Care:  08.21.2025 to 10.16.2025  Visit:  4     Note Type: Treatment Note  ASSESSMENT:       OBJECTIVE:  Isabella Cook was seen this PM for ST services.  Clinician able to review the results from prior session.  Essentially, all domains are within functional limits.  Pt did exhibit mild difficulty with generative naming tasks.  Clinician presented the patient with various tasks targeting word finding (generative naming/naming and adding to categories) with Mod I.  Pt also completed various tasks targeting higher level attention, e.g. calculations - completed with Mod I.  Stimulability: Pt was very stimulable Treatment Recommendations: Continue Treatment  PLAN:  SLP Follow-up / Frequency: 2x week for Planned Treatment Duration : 1-2x/week for ~5 Weeks.   Planned Interventions: Education administrator, Occupational psychologist, Word Finding Treatment, Word Retrieval Cueing Strategies    Prognosis:  Good   Negative Prognosis Rationale: Language deficits     Positive Prognosis Rationale: Good caregiver/family support, Motivation    Goals:      STG 1: The patient will produce multiple sentences placing pauses in appropriate places in 90% of opportunities with SUP cues.   STG 2: The patient will participate in conversation at 90% intelligibility given SUP cues to utilize clear speech strategies to communicate thoughts, feelings, and ideas.   STG 3: The patient will follow 3-step commands at 90% accuracy given frequent maximal  visual cues.   STG 4: The patient will complete generative naming tasks with >90% accuracy with SUP cues.   STG 5: The patient will read paragraphs and answer comprehension questions at 90% accuracy with SUP cues.               Time Frame: 5  Duration: weeks  LTG #1: The patient will be able to independently express wants/needs by discharge.                               Time Frame: 5  Duration: weeks     SUBJECTIVE:  Isabella Cook is an 83 y/o Female referred by Damien POUR. Dolleschel, FNP, to [facility] to undergo a cognitive-linguistic assessment.   Prior medical history includes, but is not limited to:  former tobacco use, breast cancer (2017 treated with lumpectomy and carboplatin /paclitaxel /trastuzumab  now on tamoxifen ), hysterectomy, cholecystectomy, oophorectomy, appendectomy, and hernia.  MRI Brain 07.09.25: "Late acute to early subacute left ACA territory infarct." Pain?: No         Education Provided: Patient   Response to Education: Understanding verbalized      Session Duration : 45    Today's Charges (noted here with $$): SLP Treatment Charges $$ 92507-Treatment S/L/V - individual [mins]: 45            I attest that I have reviewed the above information. Signed: Sheree JONELLE Gins, [place] 09/20/2023 6:54 PM

## 2023-09-22 ENCOUNTER — Ambulatory Visit: Attending: Neurology

## 2023-09-22 DIAGNOSIS — I639 Cerebral infarction, unspecified: Secondary | ICD-10-CM | POA: Diagnosis not present

## 2023-09-22 DIAGNOSIS — I63421 Cerebral infarction due to embolism of right anterior cerebral artery: Secondary | ICD-10-CM | POA: Diagnosis not present

## 2023-09-23 ENCOUNTER — Ambulatory Visit: Payer: Self-pay | Admitting: Neurology

## 2023-09-23 ENCOUNTER — Other Ambulatory Visit: Payer: Self-pay | Admitting: *Deleted

## 2023-09-23 DIAGNOSIS — C50412 Malignant neoplasm of upper-outer quadrant of left female breast: Secondary | ICD-10-CM

## 2023-09-26 ENCOUNTER — Inpatient Hospital Stay: Attending: Hematology and Oncology | Admitting: Hematology and Oncology

## 2023-09-26 ENCOUNTER — Inpatient Hospital Stay

## 2023-09-26 VITALS — BP 154/59 | HR 75 | Temp 98.2°F | Resp 18 | Ht 63.5 in | Wt 156.7 lb

## 2023-09-26 DIAGNOSIS — Z8673 Personal history of transient ischemic attack (TIA), and cerebral infarction without residual deficits: Secondary | ICD-10-CM | POA: Insufficient documentation

## 2023-09-26 DIAGNOSIS — C50412 Malignant neoplasm of upper-outer quadrant of left female breast: Secondary | ICD-10-CM | POA: Diagnosis not present

## 2023-09-26 DIAGNOSIS — Z7982 Long term (current) use of aspirin: Secondary | ICD-10-CM | POA: Insufficient documentation

## 2023-09-26 DIAGNOSIS — Z170 Estrogen receptor positive status [ER+]: Secondary | ICD-10-CM | POA: Insufficient documentation

## 2023-09-26 DIAGNOSIS — N952 Postmenopausal atrophic vaginitis: Secondary | ICD-10-CM | POA: Insufficient documentation

## 2023-09-26 DIAGNOSIS — Z923 Personal history of irradiation: Secondary | ICD-10-CM | POA: Diagnosis not present

## 2023-09-26 DIAGNOSIS — N390 Urinary tract infection, site not specified: Secondary | ICD-10-CM | POA: Diagnosis not present

## 2023-09-26 DIAGNOSIS — Z1732 Human epidermal growth factor receptor 2 negative status: Secondary | ICD-10-CM | POA: Insufficient documentation

## 2023-09-26 DIAGNOSIS — Z1721 Progesterone receptor positive status: Secondary | ICD-10-CM | POA: Diagnosis not present

## 2023-09-26 LAB — CMP (CANCER CENTER ONLY)
ALT: 10 U/L (ref 0–44)
AST: 16 U/L (ref 15–41)
Albumin: 4.1 g/dL (ref 3.5–5.0)
Alkaline Phosphatase: 32 U/L — ABNORMAL LOW (ref 38–126)
Anion gap: 5 (ref 5–15)
BUN: 15 mg/dL (ref 8–23)
CO2: 29 mmol/L (ref 22–32)
Calcium: 9.9 mg/dL (ref 8.9–10.3)
Chloride: 105 mmol/L (ref 98–111)
Creatinine: 0.97 mg/dL (ref 0.44–1.00)
GFR, Estimated: 58 mL/min — ABNORMAL LOW (ref >60)
Glucose, Bld: 83 mg/dL (ref 70–99)
Potassium: 3.9 mmol/L (ref 3.5–5.1)
Sodium: 139 mmol/L (ref 135–145)
Total Bilirubin: 0.3 mg/dL (ref 0.0–1.2)
Total Protein: 7.2 g/dL (ref 6.5–8.1)

## 2023-09-26 LAB — CBC WITH DIFFERENTIAL (CANCER CENTER ONLY)
Abs Immature Granulocytes: 0.01 K/uL (ref 0.00–0.07)
Basophils Absolute: 0.1 K/uL (ref 0.0–0.1)
Basophils Relative: 1 %
Eosinophils Absolute: 0.2 K/uL (ref 0.0–0.5)
Eosinophils Relative: 4 %
HCT: 37.9 % (ref 36.0–46.0)
Hemoglobin: 12.5 g/dL (ref 12.0–15.0)
Immature Granulocytes: 0 %
Lymphocytes Relative: 28 %
Lymphs Abs: 1.3 K/uL (ref 0.7–4.0)
MCH: 29.8 pg (ref 26.0–34.0)
MCHC: 33 g/dL (ref 30.0–36.0)
MCV: 90.5 fL (ref 80.0–100.0)
Monocytes Absolute: 0.7 K/uL (ref 0.1–1.0)
Monocytes Relative: 16 %
Neutro Abs: 2.3 K/uL (ref 1.7–7.7)
Neutrophils Relative %: 51 %
Platelet Count: 158 K/uL (ref 150–400)
RBC: 4.19 MIL/uL (ref 3.87–5.11)
RDW: 13.2 % (ref 11.5–15.5)
WBC Count: 4.6 K/uL (ref 4.0–10.5)
nRBC: 0 % (ref 0.0–0.2)

## 2023-09-26 MED ORDER — TAMOXIFEN CITRATE 20 MG PO TABS: 20.0000 mg | ORAL_TABLET | Freq: Every day | ORAL | 4 refills | Status: DC

## 2023-09-26 NOTE — Assessment & Plan Note (Signed)
 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  03/25/2015: Breast MRI: Left breast UOQ: ADH, right breast LOQ: Stage IA T1BN0 IDC ER/PR positive HER2 negative Ki-67 4% 04/29/2015: Neoadjuvant chemotherapy with Taxol  weekly x4 with trastuzumab  (Taxol  discontinued for neuropathy), and carboplatin  and gemcitabine  substituted started 06/10/2018 17x3 doses completed 07/15/2015 04/27/2016: Herceptin  completed 08/07/2015: Right lumpectomy: Complete pathologic response; left lumpectomies: T2N1 grade 1 IDC with positive margins, 09/02/2015: Clear the margins 10/07/2015-11/17/2015: Adjuvant radiation   Current treatment: Tamoxifen  started 12/23/2015 Breast cancer surveillance: 1.  Breast exam 09/26/2023: Benign 2. mammogram 08/12/2023 benign breast density category C   BCI predicted benefit for extended endocrine therapy (10.1% risk over next 5 years) With 10 years of HT (Risk 3.3%-4.2%)   Neck pain: CT neck 09/27/2021: Osteoarthritis . Return to clinic in 1 year for follow-up

## 2023-09-26 NOTE — Progress Notes (Signed)
 Patient Care Team: Pablo Perkins, FNP as PCP - General (Family Medicine) Vanderbilt Ned, MD as Consulting Physician (General Surgery) Unice Pac, MD as Consulting Physician (Neurosurgery) Waddell Lamar BRAVO, MD as Referring Physician (Otolaryngology) Bensimhon, Toribio SAUNDERS, MD as Consulting Physician (Cardiology) Crawford Morna Pickle, NP as Nurse Practitioner (Hematology and Oncology) Armbruster, Elspeth SQUIBB, MD as Consulting Physician (Gastroenterology)  DIAGNOSIS:  Encounter Diagnosis  Name Primary?   Malignant neoplasm of upper-outer quadrant of left female breast, unspecified estrogen receptor status (HCC) Yes    SUMMARY OF ONCOLOGIC HISTORY: Oncology History  Breast cancer of lower-outer quadrant of right female breast (HCC)  02/27/2015 Initial Biopsy    left breast upper outer quadrant biopsy 02/27/2015 for a clinical mT1-3 N1 invasive ductal carcinoma, grade 2, strongly estrogen and progesterone receptor positive, HER-2 amplified, with an MIB-1 of 20%.              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             (b) right breast lower outer quadrant: a clinical T1b N0, stage IA invasive ductal carcinoma, estrogen and progesterone receptor positive, HER-2 not amplified, with an Mib-1 of 4%   04/29/2015 - 07/15/2015 Neo-Adjuvant Chemotherapy   neoadjuvant chemotherapy consisting of weekly paclitaxel  12 together with weekly trastuzumab  started 04/29/2015             (a) 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   09/02/2015 Surgery   Re excision cleared margins   10/07/2015 - 11/17/2015 Radiation Therapy   adjuvant radiation 10/07/2015 to 11/17/2015  1. The Right breast was treated to 50 Gy in 25 fractions at 2 Gy per fraction. 2. The Left breast (4-field) was treated to 50 Gy in 25 fractions at 2 Gy per fraction. 3. The Left breast was boosted to 10 Gy in 5 fractions at 2 Gy per fraction.    12/2015 -  Anti-estrogen oral therapy   Tamoxifen  daily     CHIEF COMPLIANT: Follow-up on tamoxifen , recent history of stroke  HISTORY OF PRESENT ILLNESS:   History of Present Illness Isabella Cook is an 83 year old female with a history of breast cancer on tamoxifen  therapy who presents for follow-up after a recent stroke.  She experienced a stroke on July 10, 2023, affecting her right side and speech, with an MRI confirming an acute to subacute left ACA territory stroke. She reports near-complete recovery.  She has been on tamoxifen  for eight years, with two more years planned. She experiences vaginal dryness, associated with UTIs, and uses vaginal estrogen cream.  She is also taking baby aspirin.  She recently had a UTI with Staph epidermidis and is on doxycycline. She reports itching of the right nipple, attributed to dryness. A recent mammogram showed dense breast tissue but was otherwise unremarkable. She has no fevers and is taking cold medication.     ALLERGIES:  is allergic to beef (bovine) protein, egg-derived products, gelatin, gluten meal, lactalbumin, lambs quarters, milk-related compounds, pork allergy, pork-derived products, poultry meal, sulfa antibiotics, tilactase, wheat, whey, yeast, black walnut  pollen, soy allergy (obsolete), almond oil, corylus, other, and peanut oil.  MEDICATIONS:  Current Outpatient Medications  Medication Sig Dispense Refill   calcium carbonate (TUMS - DOSED IN MG ELEMENTAL CALCIUM) 500 MG chewable tablet Chew 500 mg by mouth daily as needed for indigestion or heartburn.      clobetasol (TEMOVATE) 0.05 % external solution SMARTSIG:Sparingly Topical Daily     co-enzyme Q-10 30 MG capsule Take 30 mg by mouth.     diclofenac Sodium (VOLTAREN) 1 % GEL SMARTSIG:2 Gram(s) Topical 4 Times Daily PRN     diphenhydrAMINE  (SOMINEX) 25 MG tablet Take 25 mg by mouth at bedtime as needed. Reported on 05/20/2015     Doxycycline Hyclate 50 MG TABS Take 1 tablet by mouth daily.     EPINEPHrine  0.3 mg/0.3 mL IJ SOAJ injection Inject into the muscle.     estradiol (ESTRING) 2 MG vaginal ring Place 2 mg vaginally every 3 (three) months. follow package directions     famotidine  (PEPCID ) 20 MG tablet Take 20 mg by mouth daily.      Liothyronine Sodium POWD 5 mcg by Does not apply route.     loratadine (CLARITIN) 10 MG tablet Take 10 mg by mouth daily.     Magnesium Citrate 100 MG TABS Take 400 mg by mouth daily.      melatonin 3 MG TABS tablet Take 3 mg by mouth at bedtime as needed and may repeat dose one time if needed.     Methylcellulose, Laxative, 500 MG TABS Take 2,000 mg by mouth 2 (two) times daily.     naproxen sodium (ANAPROX) 220 MG tablet Take 440 mg by mouth 3 (three) times daily with meals as needed.      Probiotic Product (ALIGN PO) Take 1 tablet by mouth daily.      VITAMIN D-VITAMIN K PO Take 1 capsule by mouth daily. Reported on 04/15/2015     No current facility-administered medications for this visit.    PHYSICAL EXAMINATION: ECOG PERFORMANCE STATUS: 1 - Symptomatic but completely ambulatory  Vitals:   09/26/23 1412  BP: (!) 154/59  Pulse: 75  Resp: 18  Temp: 98.2 F (36.8 C)  SpO2: 98%   Filed Weights   09/26/23 1412  Weight: 156 lb 11.2 oz (71.1  kg)      LABORATORY DATA:  I have reviewed the data as listed    Latest Ref Rng & Units 09/26/2023    1:54 PM 09/15/2022   11:47 AM 09/14/2021    9:40 AM  CMP  Glucose 70 - 99 mg/dL 83  95  863   BUN 8 - 23 mg/dL 15  11  13    Creatinine 0.44 - 1.00 mg/dL 9.02  9.00  8.95   Sodium 135 - 145 mmol/L 139  140  138   Potassium 3.5 - 5.1 mmol/L 3.9  4.2  4.2   Chloride 98 - 111 mmol/L 105  107  105   CO2 22 - 32 mmol/L  29  30  30    Calcium 8.9 - 10.3 mg/dL 9.9  9.7  9.7   Total Protein 6.5 - 8.1 g/dL 7.2  7.0  6.9   Total Bilirubin 0.0 - 1.2 mg/dL 0.3  0.4  0.4   Alkaline Phos 38 - 126 U/L 32  34  33   AST 15 - 41 U/L 16  15  13    ALT 0 - 44 U/L 10  10  10      Lab Results  Component Value Date   WBC 4.6 09/26/2023   HGB 12.5 09/26/2023   HCT 37.9 09/26/2023   MCV 90.5 09/26/2023   PLT 158 09/26/2023   NEUTROABS 2.3 09/26/2023    ASSESSMENT & PLAN:  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  03/25/2015: Breast MRI: Left breast UOQ: ADH, right breast LOQ: Stage IA T1BN0 IDC ER/PR positive HER2 negative Ki-67 4% 04/29/2015: Neoadjuvant chemotherapy with Taxol  weekly x4 with trastuzumab  (Taxol  discontinued for neuropathy), and carboplatin  and gemcitabine  substituted started 06/10/2018 17x3 doses completed 07/15/2015 04/27/2016: Herceptin  completed 08/07/2015: Right lumpectomy: Complete pathologic response; left lumpectomies: T2N1 grade 1 IDC with positive margins, 09/02/2015: Clear the margins 10/07/2015-11/17/2015: Adjuvant radiation   Current treatment: Tamoxifen  started 12/23/2015 discontinued 09/26/2023 (due to stroke history) Breast cancer surveillance: 1.  Breast exam 09/26/2023: Benign 2. mammogram 08/12/2023 benign breast density category C   BCI predicted benefit for extended  endocrine therapy (10.1% risk over next 5 years) With 10 years of HT (Risk 3.3%-4.2%)   I recommended discontinuing tamoxifen  therapy because of recent stroke.  This concludes her treatment.  Return to clinic on an as-needed basis. Assessment & Plan Estrogen receptor positive right breast cancer Tamoxifen  discontinued after 8 years due to stroke risk. Minimal benefit from continuation. Benefits expected to last 8-10 years post-discontinuation. - Discontinue tamoxifen . - Cancel tamoxifen  prescription if contacted by pharmacy. - Continue regular mammograms.  Acute left ACA territory stroke Resolved stroke confirmed by MRI. Tamoxifen  discontinued to prevent further clot risk.  Postmenopausal atrophic vaginitis Vaginal dryness managed with estradiol (estring). - Continue estradiol (estring) as prescribed. - Use moisturizers for nipple itching due to dryness.  Urinary tract infection UTI caused by Staph epidermidis, possibly exacerbated by tamoxifen -related dryness.      No orders of the defined types were placed in this encounter.  The patient has a good understanding of the overall plan. she agrees with it. she will call with any problems that may develop before the next visit here. Total time spent: 30 mins including face to face time and time spent for planning, charting and co-ordination of care   Naomi MARLA Chad, MD 09/26/23

## 2024-02-16 ENCOUNTER — Ambulatory Visit: Admitting: Neurology

## 2024-09-27 ENCOUNTER — Ambulatory Visit: Admitting: Hematology and Oncology
